# Patient Record
Sex: Male | Born: 1955 | Race: Black or African American | Hispanic: No | Marital: Married | Smoking: Former smoker
Health system: Southern US, Community
[De-identification: ages and names within clinical notes are randomized; demographics above are authoritative.]

## PROBLEM LIST (undated history)

## (undated) ENCOUNTER — Ambulatory Visit (HOSPITAL_COMMUNITY): Admission: EM | Payer: BC Managed Care – PPO

## (undated) DIAGNOSIS — I499 Cardiac arrhythmia, unspecified: Secondary | ICD-10-CM

## (undated) DIAGNOSIS — K219 Gastro-esophageal reflux disease without esophagitis: Secondary | ICD-10-CM

## (undated) DIAGNOSIS — G4730 Sleep apnea, unspecified: Secondary | ICD-10-CM

## (undated) DIAGNOSIS — M1990 Unspecified osteoarthritis, unspecified site: Secondary | ICD-10-CM

## (undated) DIAGNOSIS — I10 Essential (primary) hypertension: Secondary | ICD-10-CM

## (undated) DIAGNOSIS — E785 Hyperlipidemia, unspecified: Secondary | ICD-10-CM

## (undated) HISTORY — PX: NO PAST SURGERIES: SHX2092

---

## 1999-01-30 ENCOUNTER — Emergency Department (HOSPITAL_COMMUNITY): Admission: EM | Admit: 1999-01-30 | Discharge: 1999-01-30 | Payer: Self-pay | Admitting: Emergency Medicine

## 2000-09-05 ENCOUNTER — Emergency Department (HOSPITAL_COMMUNITY): Admission: EM | Admit: 2000-09-05 | Discharge: 2000-09-05 | Payer: Self-pay | Admitting: Emergency Medicine

## 2002-02-26 ENCOUNTER — Emergency Department (HOSPITAL_COMMUNITY): Admission: EM | Admit: 2002-02-26 | Discharge: 2002-02-26 | Payer: Self-pay | Admitting: Emergency Medicine

## 2002-06-28 ENCOUNTER — Emergency Department (HOSPITAL_COMMUNITY): Admission: EM | Admit: 2002-06-28 | Discharge: 2002-06-28 | Payer: Self-pay | Admitting: Emergency Medicine

## 2003-04-19 ENCOUNTER — Emergency Department (HOSPITAL_COMMUNITY): Admission: EM | Admit: 2003-04-19 | Discharge: 2003-04-19 | Payer: Self-pay

## 2003-04-19 ENCOUNTER — Encounter: Payer: Self-pay | Admitting: Emergency Medicine

## 2003-11-14 ENCOUNTER — Emergency Department (HOSPITAL_COMMUNITY): Admission: EM | Admit: 2003-11-14 | Discharge: 2003-11-14 | Payer: Self-pay | Admitting: Emergency Medicine

## 2004-08-17 ENCOUNTER — Emergency Department (HOSPITAL_COMMUNITY): Admission: EM | Admit: 2004-08-17 | Discharge: 2004-08-18 | Payer: Self-pay | Admitting: Emergency Medicine

## 2005-12-03 ENCOUNTER — Emergency Department (HOSPITAL_COMMUNITY): Admission: EM | Admit: 2005-12-03 | Discharge: 2005-12-03 | Payer: Self-pay | Admitting: Emergency Medicine

## 2006-02-24 ENCOUNTER — Emergency Department (HOSPITAL_COMMUNITY): Admission: EM | Admit: 2006-02-24 | Discharge: 2006-02-25 | Payer: Self-pay | Admitting: Emergency Medicine

## 2006-07-10 ENCOUNTER — Emergency Department (HOSPITAL_COMMUNITY): Admission: EM | Admit: 2006-07-10 | Discharge: 2006-07-11 | Payer: Self-pay | Admitting: Emergency Medicine

## 2006-10-31 ENCOUNTER — Emergency Department (HOSPITAL_COMMUNITY): Admission: EM | Admit: 2006-10-31 | Discharge: 2006-10-31 | Payer: Self-pay | Admitting: Emergency Medicine

## 2007-08-08 ENCOUNTER — Emergency Department (HOSPITAL_COMMUNITY): Admission: EM | Admit: 2007-08-08 | Discharge: 2007-08-08 | Payer: Self-pay | Admitting: Emergency Medicine

## 2008-01-10 ENCOUNTER — Emergency Department (HOSPITAL_COMMUNITY): Admission: EM | Admit: 2008-01-10 | Discharge: 2008-01-10 | Payer: Self-pay | Admitting: Emergency Medicine

## 2008-06-12 ENCOUNTER — Emergency Department (HOSPITAL_COMMUNITY): Admission: EM | Admit: 2008-06-12 | Discharge: 2008-06-12 | Payer: Self-pay | Admitting: Emergency Medicine

## 2009-02-05 ENCOUNTER — Emergency Department (HOSPITAL_COMMUNITY): Admission: EM | Admit: 2009-02-05 | Discharge: 2009-02-05 | Payer: Self-pay | Admitting: Emergency Medicine

## 2012-01-20 ENCOUNTER — Emergency Department (HOSPITAL_COMMUNITY): Payer: 59

## 2012-01-20 ENCOUNTER — Emergency Department (HOSPITAL_COMMUNITY)
Admission: EM | Admit: 2012-01-20 | Discharge: 2012-01-20 | Disposition: A | Discharge status: Home / Self Care | Payer: 59 | Attending: Emergency Medicine | Admitting: Emergency Medicine

## 2012-01-20 ENCOUNTER — Encounter (HOSPITAL_COMMUNITY): Payer: Self-pay | Admitting: *Deleted

## 2012-01-20 DIAGNOSIS — IMO0002 Reserved for concepts with insufficient information to code with codable children: Secondary | ICD-10-CM | POA: Insufficient documentation

## 2012-01-20 DIAGNOSIS — M1990 Unspecified osteoarthritis, unspecified site: Secondary | ICD-10-CM

## 2012-01-20 DIAGNOSIS — M25569 Pain in unspecified knee: Secondary | ICD-10-CM

## 2012-01-20 DIAGNOSIS — M1710 Unilateral primary osteoarthritis, unspecified knee: Secondary | ICD-10-CM | POA: Insufficient documentation

## 2012-01-20 MED ORDER — HYDROCODONE-ACETAMINOPHEN 5-500 MG PO TABS: 1.0000 | ORAL_TABLET | Freq: Four times a day (QID) | ORAL | Status: AC | PRN

## 2012-01-20 MED ORDER — IBUPROFEN 200 MG PO TABS
600.0000 mg | ORAL_TABLET | Freq: Once | ORAL | Status: AC
  Administered 2012-01-20: 600 mg via ORAL
  Filled 2012-01-20: qty 3

## 2012-01-20 MED ORDER — IBUPROFEN 600 MG PO TABS: 600.0000 mg | ORAL_TABLET | Freq: Four times a day (QID) | ORAL | Status: AC | PRN

## 2012-01-20 NOTE — ED Provider Notes (Addendum)
History     CSN: [id number]  Arrival date & time 01/20/12  1027   First MD Initiated Contact with Patient 01/20/12 1101      Chief Complaint  Patient presents with  . Knee Pain    (Consider location/radiation/quality/duration/timing/severity/associated sxs/prior treatment) Patient is a 56 y.o. male presenting with knee pain. The history is provided by the patient and a relative.  Knee Pain Pertinent negatives include no chest pain and no shortness of breath.  pt indicates yesterday moved his left knee a certain way and felt a pop. C/o soreness/pain since. Constant, dull, nonradiating. Worse w walking. No hip or ankle pain. No leg swelling. No numbness/weakness. No fall. No skin changes, sts or erythema. States similar problems intermittently in knee for 'long time'.   History reviewed. No pertinent past medical history.  History reviewed. No pertinent past surgical history.  History reviewed. No pertinent family history.  History  Substance Use Topics  . Smoking status: Not on file  . Smokeless tobacco: Not on file  . Alcohol Use: No      Review of Systems  Constitutional: Negative for fever.  Respiratory: Negative for shortness of breath.   Cardiovascular: Negative for chest pain and leg swelling.  Neurological: Negative for weakness and numbness.    Allergies  Review of patient's allergies indicates no known allergies.  Home Medications   Current Outpatient Rx  Name Route Sig Dispense Refill  . BC HEADACHE PO Oral Take 1 packet by mouth every 6 (six) hours as needed. For pain    . ALKA-SELTZER PLUS COLD PO Oral Take 1 capsule by mouth every 4 (four) hours as needed. For  Cold symptoms      BP 130/93  Pulse 82  Temp(Src) 97.8 F (36.6 C) (Oral)  Resp 20  SpO2 94%  Physical Exam  Nursing note and vitals reviewed. Constitutional: He is oriented to person, place, and time. He appears well-developed and well-nourished. No distress.  HENT:  Head:  Atraumatic.  Neck: Neck supple. No tracheal deviation present.  Cardiovascular: Normal rate.   Pulmonary/Chest: Effort normal. No accessory muscle usage. No respiratory distress.  Abdominal: He exhibits no distension.  Musculoskeletal: Normal range of motion. He exhibits no edema.       Good rom at left knee hip and ankle. Distal pulses palp. Tenderness left knee anteriorly. No increased warmth, no skin changes or sts. No leg swelling. No large effusion noted. Knee stable, no gross ligament laxity appreciated.   Neurological: He is alert and oriented to person, place, and time.  Skin: Skin is warm and dry.  Psychiatric: He has a normal mood and affect.    ED Course  Procedures (including critical care time)   No results found for this or any previous visit. Dg Knee Complete 4 Views Left  01/20/2012  *RADIOLOGY REPORT*  Clinical Data: Pain  LEFT KNEE - COMPLETE 4+ VIEW  Comparison: None.  Findings: There is a small knee joint effusion.  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  There is evidence of an old MCL tear with chronic calcification in the ligament.  IMPRESSION: Small amount of joint fluid.  Tricompartmental degenerative disease.  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  Evidence of old MCL injury with subsequent calcification.  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  Original Report Authenticated By: Thomasenia Sales, M.D.     MDM  Motrin po. Xrays.   Discussed xrays w pt.       Suzi Roots, MD 01/20/12 1132  Suzi Roots, MD 01/20/12 1149

## 2012-01-20 NOTE — ED Notes (Signed)
Reports feeling a "pop" feeling to left knee last night and now having pain.

## 2012-01-20 NOTE — Discharge Instructions (Signed)
Take motrin as need for pain. Do not take other non steriodal/antiinflammatory pain medication (or goodys, aleve, etc) when taking motrin.  You may also take vicodin as need for pain. No driving when taking vicodin. Also, do not take tylenol or acetaminophen containing medication when taking vicodin.  Follow up with orthopedist in 1-2 weeks - call office Monday to arrange follow up appointment.  Your knee xrays were read as follows, discuss with orthopedist at follow up.    01/20/2012  *RADIOLOGY REPORT*  Clinical Data: Pain  LEFT KNEE - COMPLETE 4+ VIEW  Comparison: None.  Findings: There is a small knee joint effusion.  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  There is evidence of an old MCL tear with chronic calcification in the ligament.  IMPRESSION: Small amount of joint fluid.  Tricompartmental degenerative disease.  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  Evidence of old MCL injury with subsequent calcification.  Chronic fragmentation of the tibial tubercle related to old Osgood- Lobbyist.   Return to ER if worse, fevers, redness, severe swelling, other concern.      Knee Pain The knee is the complex joint between your thigh and your lower leg. It is made up of bones, tendons, ligaments, and cartilage. The bones that make up the knee are:  The femur in the thigh.   The tibia and fibula in the lower leg.   The patella or kneecap riding in the groove on the lower femur.  CAUSES  Knee pain is a common complaint with many causes. A few of these causes are:  Injury, such as:   A ruptured ligament or tendon injury.   Torn cartilage.   Medical conditions, such as:   Gout   Arthritis   Infections   Overuse,  over training or overdoing a physical activity.  Knee pain can be minor or severe. Knee pain can accompany debilitating injury. Minor knee problems often respond well to self-care measures or get well on their own. More serious injuries may need medical intervention or even surgery. SYMPTOMS The knee is complex. Symptoms of knee problems can vary widely. Some of the problems are:  Pain with movement and weight bearing.   Swelling and tenderness.   Buckling of the knee.   Inability to straighten or extend your knee.   Your knee locks and you cannot straighten it.   Warmth and redness with pain and fever.   Deformity or dislocation of the kneecap.  DIAGNOSIS  Determining what is wrong may be very straight forward such as when there is an injury. It can also be challenging because of the complexity of the knee. Tests to make a diagnosis may include:  Your caregiver taking a history and doing a physical exam.   Routine X-rays can be used to rule out other problems. X-rays will not reveal a cartilage tear. Some injuries of the knee can be diagnosed by:   Arthroscopy a surgical technique by which a small video camera is inserted through tiny incisions on the sides of the knee. This procedure is used to examine and repair internal knee joint problems. Tiny instruments can be used during arthroscopy to repair the torn knee cartilage (meniscus).   Arthrography is a radiology technique. A contrast liquid is directly injected into the knee  joint. Internal structures of the knee joint then become visible on X-ray film.   An MRI scan is a non x-ray radiology procedure in which magnetic fields and a computer produce two- or three-dimensional images of the inside of the knee. Cartilage tears are often visible using an MRI scanner. MRI scans have largely replaced arthrography in diagnosing cartilage tears of the knee.   Blood work.   Examination of the fluid that helps to lubricate the knee joint  (synovial fluid). This is done by taking a sample out using a needle and a syringe.  TREATMENT The treatment of knee problems depends on the cause. Some of these treatments are:  Depending on the injury, proper casting, splinting, surgery or physical therapy care will be needed.   Give yourself adequate recovery time. Do not overuse your joints. If you begin to get sore during workout routines, back off. Slow down or do fewer repetitions.   For repetitive activities such as cycling or running, maintain your strength and nutrition.   Alternate muscle groups. For example if you are a weight lifter, work the upper body on one day and the lower body the next.   Either tight or weak muscles do not give the proper support for your knee. Tight or weak muscles do not absorb the stress placed on the knee joint. Keep the muscles surrounding the knee strong.   Take care of mechanical problems.   If you have flat feet, orthotics or special shoes may help. See your caregiver if you need help.   Arch supports, sometimes with wedges on the inner or outer aspect of the heel, can help. These can shift pressure away from the side of the knee most bothered by osteoarthritis.   A brace called an "unloader" brace also may be used to help ease the pressure on the most arthritic side of the knee.   If your caregiver has prescribed crutches, braces, wraps or ice, use as directed. The acronym for this is PRICE. This means protection, rest, ice, compression and elevation.   Nonsteroidal anti-inflammatory drugs (NSAID's), can help relieve pain. But if taken immediately after an injury, they may actually increase swelling. Take NSAID's with food in your stomach. Stop them if you develop stomach problems. Do not take these if you have a history of ulcers, stomach pain or bleeding from the bowel. Do not take without your caregiver's approval if you have problems with fluid retention, heart failure, or kidney problems.    For ongoing knee problems, physical therapy may be helpful.   Glucosamine and chondroitin are over-the-counter dietary supplements. Both may help relieve the pain of osteoarthritis in the knee. These medicines are different from the usual anti-inflammatory drugs. Glucosamine may decrease the rate of cartilage destruction.   Injections of a corticosteroid drug into your knee joint may help reduce the symptoms of an arthritis flare-up. They may provide pain relief that lasts a few months. You may have to wait a few months between injections. The injections do have a small increased risk of infection, water retention and elevated blood sugar levels.   Hyaluronic acid injected into damaged joints may ease pain and provide lubrication. These injections may work by reducing inflammation. A series of shots may give relief for as long as 6 months.   Topical painkillers. Applying certain ointments to your skin may help relieve the pain and stiffness of osteoarthritis. Ask your pharmacist for suggestions. Many over the-counter products are approved for temporary relief of arthritis pain.  In some countries, doctors often prescribe topical NSAID's for relief of chronic conditions such as arthritis and tendinitis. A review of treatment with NSAID creams found that they worked as well as oral medications but without the serious side effects.  PREVENTION  Maintain a healthy weight. Extra pounds put more strain on your joints.   Get strong, stay limber. Weak muscles are a common cause of knee injuries. Stretching is important. Include flexibility exercises in your workouts.   Be smart about exercise. If you have osteoarthritis, chronic knee pain or recurring injuries, you may need to change the way you exercise. This does not mean you have to stop being active. If your knees ache after jogging or playing basketball, consider switching to swimming, water aerobics or other low-impact activities, at least for a  few days a week. Sometimes limiting high-impact activities will provide relief.   Make sure your shoes fit well. Choose footwear that is right for your sport.   Protect your knees. Use the proper gear for knee-sensitive activities. Use kneepads when playing volleyball or laying carpet. Buckle your seat belt every time you drive. Most shattered kneecaps occur in car accidents.   Rest when you are tired.  SEEK MEDICAL CARE IF:  You have knee pain that is continual and does not seem to be getting better.  SEEK IMMEDIATE MEDICAL CARE IF:  Your knee joint feels hot to the touch and you have a high fever. MAKE SURE YOU:   Understand these instructions.   Will watch your condition.   Will get help right away if you are not doing well or get worse.  Document Released: 07/08/2007 Document Revised: 08/30/2011 Document Reviewed: 07/08/2007 [facility] Patient Information 2012 [place], [place].    Osteoarthritis Osteoarthritis is the most common form of arthritis. It is redness, soreness, and swelling (inflammation) affecting the cartilage. Cartilage acts as a cushion, covering the ends of bones where they meet to form a joint. CAUSES  Over time, the cartilage begins to wear away. This causes bone to rub on bone. This produces pain and stiffness in the affected joints. Factors that contribute to this problem are:  Excessive body weight.   Age.   Overuse of joints.  SYMPTOMS   People with osteoarthritis usually experience joint pain, swelling, or stiffness.   Over time, the joint may lose its normal shape.   Small deposits of bone (osteophytes) may grow on the edges of the joint.   Bits of bone or cartilage can break off and float inside the joint space. This may cause more pain and damage.   Osteoarthritis can lead to depression, anxiety, feelings of helplessness, and limitations on daily activities.  The most commonly affected joints are in the:  Ends of the fingers.   Thumbs.    Neck.   Lower back.   Knees.   Hips.  DIAGNOSIS  Diagnosis is mostly based on your symptoms and exam. Tests may be helpful, including:  X-rays of the affected joint.   A computerized magnetic scan (MRI).   Blood tests to rule out other types of arthritis.   Joint fluid tests. This involves using a needle to draw fluid from the joint and examining the fluid under a microscope.  TREATMENT  Goals of treatment are to control pain, improve joint function, maintain a normal body weight, and maintain a healthy lifestyle. Treatment approaches may include:  A prescribed exercise program with rest and joint relief.   Weight control with nutritional education.   Pain  relief techniques such as:   Properly applied heat and cold.   Electric pulses delivered to nerve endings under the skin (transcutaneous electrical nerve stimulation, TENS).   Massage.   Certain supplements. Ask your caregiver before using any supplements, especially in combination with prescribed drugs.   Medicines to control pain, such as:   Acetaminophen.   Nonsteroidal anti-inflammatory drugs (NSAIDs), such as naproxen.   Narcotic or central-acting agents, such as tramadol. This drug carries a risk of addiction and is generally prescribed for short-term use.   Corticosteroids. These can be given orally or as injection. This is a short-term treatment, not recommended for routine use.   Surgery to reposition the bones and relieve pain (osteotomy) or to remove loose pieces of bone and cartilage. Joint replacement may be needed in advanced states of osteoarthritis.  HOME CARE INSTRUCTIONS  Your caregiver can recommend specific types of exercise. These may include:  Strengthening exercises. These are done to strengthen the muscles that support joints affected by arthritis. They can be performed with weights or with exercise bands to add resistance.   Aerobic activities. These are exercises, such as brisk walking  or low-impact aerobics, that get your heart pumping. They can help keep your lungs and circulatory system in shape.   Range-of-motion activities. These keep your joints limber.   Balance and agility exercises. These help you maintain daily living skills.  Learning about your condition and being actively involved in your care will help improve the course of your osteoarthritis. SEEK MEDICAL CARE IF:   You feel hot or your skin turns red.   You develop a rash in addition to your joint pain.   You have an oral temperature above 102 F (38.9 C).  FOR MORE INFORMATION  National Institute of Arthritis and Musculoskeletal and Skin Diseases: www.niams.http://www.myers.net/ [employer] on Aging: https://walker.com/ American College of Rheumatology: www.rheumatology.org Document Released: 09/10/2005 Document Revised: 08/30/2011 Document Reviewed: 12/22/2009 [facility] Patient Information 2012 [place], [place].

## 2012-01-20 NOTE — ED Notes (Signed)
Patient transported to X-ray 

## 2012-01-20 NOTE — ED Notes (Signed)
Pt c/o (L) knee pain, pt was sitting on the couch and went to straighten out his (L) leg when he felt a "pop" in his (L) knee, pt reports a sudden onset of pain that became progressively worse over night, increase pain this am especially w/standing or when trying to stretch his (L) leg out flat. 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

## 2012-02-28 ENCOUNTER — Encounter (HOSPITAL_COMMUNITY): Payer: Self-pay | Admitting: Pharmacy Technician

## 2012-03-04 ENCOUNTER — Encounter (HOSPITAL_COMMUNITY): Payer: Self-pay

## 2012-03-04 ENCOUNTER — Ambulatory Visit (HOSPITAL_COMMUNITY)
Admission: RE | Admit: 2012-03-04 | Discharge: 2012-03-04 | Disposition: A | Discharge status: Home / Self Care | Payer: 59 | Source: Ambulatory Visit | Attending: Orthopedic Surgery | Admitting: Orthopedic Surgery

## 2012-03-04 ENCOUNTER — Encounter (HOSPITAL_COMMUNITY)
Admission: RE | Admit: 2012-03-04 | Discharge: 2012-03-04 | Disposition: A | Discharge status: Home / Self Care | Payer: 59 | Source: Ambulatory Visit | Attending: Orthopedic Surgery | Admitting: Orthopedic Surgery

## 2012-03-04 DIAGNOSIS — M25569 Pain in unspecified knee: Secondary | ICD-10-CM | POA: Insufficient documentation

## 2012-03-04 DIAGNOSIS — Z01812 Encounter for preprocedural laboratory examination: Secondary | ICD-10-CM | POA: Insufficient documentation

## 2012-03-04 HISTORY — DX: Hyperlipidemia, unspecified: E78.5

## 2012-03-04 HISTORY — DX: Unspecified osteoarthritis, unspecified site: M19.90

## 2012-03-04 HISTORY — DX: Sleep apnea, unspecified: G47.30

## 2012-03-04 HISTORY — DX: Gastro-esophageal reflux disease without esophagitis: K21.9

## 2012-03-04 HISTORY — DX: Essential (primary) hypertension: I10

## 2012-03-04 HISTORY — DX: Cardiac arrhythmia, unspecified: I49.9

## 2012-03-04 LAB — PROTIME-INR: INR: 0.93 (ref 0.00–1.49)

## 2012-03-04 LAB — URINALYSIS, ROUTINE W REFLEX MICROSCOPIC
Ketones, ur: NEGATIVE mg/dL
Leukocytes, UA: NEGATIVE
Nitrite: NEGATIVE
Specific Gravity, Urine: 1.021 (ref 1.005–1.030)
pH: 6 (ref 5.0–8.0)

## 2012-03-04 LAB — SURGICAL PCR SCREEN: Staphylococcus aureus: NEGATIVE

## 2012-03-04 NOTE — Patient Instructions (Addendum)
20 Randall Cook  03/04/2012   Your procedure is scheduled on:  03/10/12  Monday  Surgery 3:35pm-5:35pm  Report to [facility] at     1:05 PM [facility]  Call this number if you have problems the morning of surgery: [phone]     Or PST   [id number]  [facility]   Remember:   Do not eat food: After Midnight.  Sunday NIGHT   May have clear liquids: until 0700 AM Monday  THEN NONE  Clear liquids include soda, tea, black coffee, apple or grape juice, broth.  Take these medicines the morning of surgery with A SIP OF WATER:NORCO IF NEEDED DO NOT FORGET TO TAKE  BYSTOLIC AT BEDTIME Sunday NIGHT  Do not wear jewelry, make-up or nail polish.  Do not wear lotions, powders, or perfumes. You may wear deodorant.  Do not shave 48 hours prior to surgery.  Do not bring valuables to the hospital.  Contacts, dentures or bridgework may not be worn into surgery.  Leave suitcase in the car. After surgery it may be brought to your room.  For patients admitted to the hospital, checkout time is 11:00 AM the day of discharge.   Patients discharged the day of surgery will not be allowed to drive home.  Name and phone number of your driver:    wife                                                                 Special Instructions: CHG Shower Use Special Wash: 1/2 bottle night before surgery and 1/2 bottle morning of surgery. REGULAR SOAP FACE AND PRIVATES                            MEN-MAY SHAVE FACE MORNING OF SURGERY  Please read over the following fact sheets that you were given: MRSA Information

## 2012-03-04 NOTE — Progress Notes (Signed)
03/04/12 1102  OBSTRUCTIVE SLEEP APNEA  Have you ever been diagnosed with sleep apnea through a sleep study? No  Do you snore loudly (loud enough to be heard through closed doors)?  1  Do you often feel tired, fatigued, or sleepy during the daytime? 0  Has anyone observed you stop breathing during your sleep? 0  Do you have, or are you being treated for high blood pressure? 1  BMI more than 35 kg/m2? 0  Age over 55 years old? 1  Neck circumference greater than 40 cm/18 inches? 0  Gender: 1  Obstructive Sleep Apnea Score 4   Score 4 or greater  Updated health history;Results sent to PCP

## 2012-03-09 NOTE — H&P (Signed)
Randall Cook is an 56 y.o. male.    Chief Complaint: left knee OA and pain   HPI: Pt is a 56 y.o. male complaining of left knee pain for 3+ years, over the last year the pain has increase significantly . Pain had continually increased since the beginning. X-rays in the clinic show end-stage arthritic changes of the letf knee. Pt has tried various conservative treatments which have failed to alleviate their symptoms, including NSAIDs and steroid injections. Various options are discussed with the patient. Risks, benefits and expectations were discussed with the patient. Patient understand the risks, benefits and expectations and wishes to proceed with surgery.   PCP:  Sissy Hoff, MD  D/C Plans:  Home with HHPT  Post-op Meds:  Rx given for ASA, Robaxin, Celebrex, Iron, Colace and MiraLax  Tranexamic Acid:   To be given  Decadron:   To be given  PMH: Past Medical History  Diagnosis Date  . GERD (gastroesophageal reflux disease)   . Arthritis   . Hyperlipidemia   . Hypertension     LOV,   Dr Azucena Cecil 02/29/12 on chart-   CBC with diff, [facility] 02/29/12 on chart  . Dysrhythmia     tachycardia- EKG,clearance 02/29/12 Dr Azucena Cecil  on chart  . Sleep apnea     STOP BANG SCORE 4    PSH: Past Surgical History  Procedure Date  . No past surgeries     Social History:  reports that he quit smoking about 13 years ago. His smoking use included Cigarettes. He has a 10 pack-year smoking history. He has never used smokeless tobacco. He reports that he drinks alcohol. He reports that he does not use illicit drugs.  Allergies:  No Known Allergies  Medications: No current facility-administered medications for this encounter.   Current Outpatient Prescriptions  Medication Sig Dispense Refill  . Aspirin-Salicylamide-Caffeine (BC HEADACHE PO) Take 1 packet by mouth every 6 (six) hours as needed. For pain      . HYDROcodone-acetaminophen (NORCO) 5-325 MG per tablet Take 1 tablet by mouth every 6 (six)  hours as needed. For pain      . ibuprofen (ADVIL,MOTRIN) 600 MG tablet Take 600 mg by mouth every 6 (six) hours as needed. For pain      . lisinopril-hydrochlorothiazide (PRINZIDE,ZESTORETIC) 20-12.5 MG per tablet Take 1 tablet by mouth daily. New rx- hasnt started yet 03/04/12      . meloxicam (MOBIC) 15 MG tablet Take 15 mg by mouth daily with breakfast.      . nebivolol (BYSTOLIC) 5 MG tablet Take 5 mg by mouth at bedtime.        ROS: Review of Systems  Constitutional: Negative.   HENT: Negative.   Eyes: Negative.   Respiratory: Negative.   Cardiovascular: Negative.   Gastrointestinal: Negative.   Genitourinary: Negative.   Musculoskeletal: Positive for joint pain.  Skin: Negative.   Neurological: Negative.   Endo/Heme/Allergies: Negative.   Psychiatric/Behavioral: Negative.      Physical Exam: BP: 138/85 ; HR: 88 ; Resp: 18 Physical Exam  Constitutional: He is oriented to person, place, and time and well-developed, well-nourished, and in no distress.  HENT:  Head: Normocephalic and atraumatic.  Nose: Nose normal.  Mouth/Throat: Oropharynx is clear and moist.  Eyes: Pupils are equal, round, and reactive to light.  Neck: Neck supple. No JVD present. No tracheal deviation present. No thyromegaly present.  Cardiovascular: Normal rate, regular rhythm, normal heart sounds and intact distal pulses.   Pulmonary/Chest: Effort  normal and breath sounds normal. No respiratory distress. He has no wheezes. He exhibits no tenderness.  Abdominal: Soft. There is no tenderness. There is no guarding.  Musculoskeletal:       Left knee: He exhibits decreased range of motion, swelling and bony tenderness. He exhibits no effusion, no ecchymosis and no deformity. tenderness found.  Lymphadenopathy:    He has no cervical adenopathy.  Neurological: He is alert and oriented to person, place, and time.  Skin: Skin is warm and dry.  Psychiatric: Affect normal.     Assessment/Plan Assessment:  left knee OA and pain   Plan: Patient will undergo a left total knee arthroplasty on 03/10/2012 per Dr. Charlann Boxer at [facility]. Risks benefits and expectation were discussed with the patient. Patient understand risks, benefits and expectation and wishes to proceed.   Randall Cook   PAC  03/09/2012, 3:22 PM

## 2012-03-10 ENCOUNTER — Inpatient Hospital Stay (HOSPITAL_COMMUNITY)
Admission: RE | Admit: 2012-03-10 | Discharge: 2012-03-11 | DRG: 470 | Disposition: A | Discharge status: Home Health | Payer: 59 | Source: Ambulatory Visit | Attending: Orthopedic Surgery | Admitting: Orthopedic Surgery

## 2012-03-10 ENCOUNTER — Encounter (HOSPITAL_COMMUNITY): Payer: Self-pay | Admitting: *Deleted

## 2012-03-10 ENCOUNTER — Encounter (HOSPITAL_COMMUNITY)
Admission: RE | Disposition: A | Discharge status: Home Health | Payer: Self-pay | Source: Ambulatory Visit | Attending: Orthopedic Surgery

## 2012-03-10 ENCOUNTER — Ambulatory Visit (HOSPITAL_COMMUNITY): Payer: 59 | Admitting: Anesthesiology

## 2012-03-10 ENCOUNTER — Encounter (HOSPITAL_COMMUNITY): Payer: Self-pay | Admitting: Anesthesiology

## 2012-03-10 DIAGNOSIS — K219 Gastro-esophageal reflux disease without esophagitis: Secondary | ICD-10-CM | POA: Diagnosis present

## 2012-03-10 DIAGNOSIS — I499 Cardiac arrhythmia, unspecified: Secondary | ICD-10-CM | POA: Diagnosis present

## 2012-03-10 DIAGNOSIS — Z7982 Long term (current) use of aspirin: Secondary | ICD-10-CM

## 2012-03-10 DIAGNOSIS — I10 Essential (primary) hypertension: Secondary | ICD-10-CM | POA: Diagnosis present

## 2012-03-10 DIAGNOSIS — Z96652 Presence of left artificial knee joint: Secondary | ICD-10-CM

## 2012-03-10 DIAGNOSIS — M87059 Idiopathic aseptic necrosis of unspecified femur: Secondary | ICD-10-CM | POA: Diagnosis present

## 2012-03-10 DIAGNOSIS — Z87891 Personal history of nicotine dependence: Secondary | ICD-10-CM

## 2012-03-10 DIAGNOSIS — M1710 Unilateral primary osteoarthritis, unspecified knee: Principal | ICD-10-CM | POA: Diagnosis present

## 2012-03-10 DIAGNOSIS — E785 Hyperlipidemia, unspecified: Secondary | ICD-10-CM | POA: Diagnosis present

## 2012-03-10 DIAGNOSIS — Z79899 Other long term (current) drug therapy: Secondary | ICD-10-CM

## 2012-03-10 HISTORY — PX: PARTIAL KNEE ARTHROPLASTY: SHX2174

## 2012-03-10 LAB — TYPE AND SCREEN
ABO/RH(D): O POS
Antibody Screen: NEGATIVE

## 2012-03-10 LAB — ABO/RH: ABO/RH(D): O POS

## 2012-03-10 SURGERY — ARTHROPLASTY, KNEE, UNICOMPARTMENTAL
Anesthesia: Spinal | Site: Knee | Laterality: Left | Wound class: Clean

## 2012-03-10 MED ORDER — KETAMINE HCL 10 MG/ML IJ SOLN
INTRAMUSCULAR | Status: DC | PRN
  Administered 2012-03-10: 40 mg via INTRAVENOUS

## 2012-03-10 MED ORDER — POLYETHYLENE GLYCOL 3350 17 G PO PACK
17.0000 g | PACK | Freq: Two times a day (BID) | ORAL | Status: DC
  Administered 2012-03-10 – 2012-03-11 (×2): 17 g via ORAL

## 2012-03-10 MED ORDER — DIPHENHYDRAMINE HCL 25 MG PO CAPS: 25.0000 mg | ORAL_CAPSULE | Freq: Four times a day (QID) | ORAL | Status: DC | PRN

## 2012-03-10 MED ORDER — MIDAZOLAM HCL 5 MG/5ML IJ SOLN
INTRAMUSCULAR | Status: DC | PRN
  Administered 2012-03-10: 2 mg via INTRAVENOUS

## 2012-03-10 MED ORDER — HYDROCODONE-ACETAMINOPHEN 7.5-325 MG PO TABS
1.0000 | ORAL_TABLET | ORAL | Status: DC
  Administered 2012-03-10: 1 via ORAL
  Administered 2012-03-11 (×4): 2 via ORAL
  Filled 2012-03-10 (×4): qty 2
  Filled 2012-03-10: qty 1
  Filled 2012-03-10: qty 2

## 2012-03-10 MED ORDER — BUPIVACAINE IN DEXTROSE 0.75-8.25 % IT SOLN
INTRATHECAL | Status: DC | PRN
  Administered 2012-03-10: 1.8 mL via INTRATHECAL

## 2012-03-10 MED ORDER — ZOLPIDEM TARTRATE 5 MG PO TABS: 5.0000 mg | ORAL_TABLET | Freq: Every evening | ORAL | Status: DC | PRN

## 2012-03-10 MED ORDER — DOCUSATE SODIUM 100 MG PO CAPS
100.0000 mg | ORAL_CAPSULE | Freq: Two times a day (BID) | ORAL | Status: DC
  Administered 2012-03-11: 100 mg via ORAL

## 2012-03-10 MED ORDER — BUPIVACAINE-EPINEPHRINE PF 0.25-1:200000 % IJ SOLN
INTRAMUSCULAR | Status: AC
Start: 2012-03-10 — End: 2012-03-10
  Filled 2012-03-10: qty 30

## 2012-03-10 MED ORDER — FENTANYL CITRATE 0.05 MG/ML IJ SOLN
INTRAMUSCULAR | Status: DC | PRN
  Administered 2012-03-10 (×4): 25 ug via INTRAVENOUS
  Administered 2012-03-10 (×2): 50 ug via INTRAVENOUS

## 2012-03-10 MED ORDER — FLEET ENEMA 7-19 GM/118ML RE ENEM: 1.0000 | ENEMA | Freq: Once | RECTAL | Status: AC | PRN

## 2012-03-10 MED ORDER — METOCLOPRAMIDE HCL 5 MG/ML IJ SOLN: 5.0000 mg | Freq: Three times a day (TID) | INTRAMUSCULAR | Status: DC | PRN

## 2012-03-10 MED ORDER — MENTHOL 3 MG MT LOZG
1.0000 | LOZENGE | OROMUCOSAL | Status: DC | PRN
  Filled 2012-03-10: qty 9

## 2012-03-10 MED ORDER — KETOROLAC TROMETHAMINE 30 MG/ML IJ SOLN
INTRAMUSCULAR | Status: DC | PRN
  Administered 2012-03-10: 30 mg

## 2012-03-10 MED ORDER — BUPIVACAINE-EPINEPHRINE PF 0.25-1:200000 % IJ SOLN
INTRAMUSCULAR | Status: AC
  Filled 2012-03-10: qty 30

## 2012-03-10 MED ORDER — HYDROMORPHONE HCL PF 1 MG/ML IJ SOLN
INTRAMUSCULAR | Status: AC
  Filled 2012-03-10: qty 1

## 2012-03-10 MED ORDER — PHENOL 1.4 % MT LIQD
1.0000 | OROMUCOSAL | Status: DC | PRN
  Filled 2012-03-10: qty 177

## 2012-03-10 MED ORDER — NEBIVOLOL HCL 5 MG PO TABS
5.0000 mg | ORAL_TABLET | Freq: Every day | ORAL | Status: DC
  Administered 2012-03-10: 5 mg via ORAL
  Filled 2012-03-10 (×2): qty 1

## 2012-03-10 MED ORDER — TRANEXAMIC ACID 100 MG/ML IV SOLN
1650.0000 mg | Freq: Once | INTRAVENOUS | Status: AC
  Administered 2012-03-10: 1650 mg via INTRAVENOUS
  Filled 2012-03-10: qty 16.5

## 2012-03-10 MED ORDER — ONDANSETRON HCL 4 MG PO TABS: 4.0000 mg | ORAL_TABLET | Freq: Four times a day (QID) | ORAL | Status: DC | PRN

## 2012-03-10 MED ORDER — CEFAZOLIN SODIUM-DEXTROSE 2-3 GM-% IV SOLR
INTRAVENOUS | Status: AC
Start: 2012-03-10 — End: 2012-03-10
  Filled 2012-03-10: qty 50

## 2012-03-10 MED ORDER — CEFAZOLIN SODIUM-DEXTROSE 2-3 GM-% IV SOLR
2.0000 g | Freq: Once | INTRAVENOUS | Status: AC
  Administered 2012-03-10: 2 g via INTRAVENOUS

## 2012-03-10 MED ORDER — METHOCARBAMOL 100 MG/ML IJ SOLN
500.0000 mg | Freq: Four times a day (QID) | INTRAVENOUS | Status: DC | PRN
  Filled 2012-03-10: qty 5

## 2012-03-10 MED ORDER — CEFAZOLIN SODIUM-DEXTROSE 2-3 GM-% IV SOLR
2.0000 g | Freq: Four times a day (QID) | INTRAVENOUS | Status: AC
  Administered 2012-03-11 (×2): 2 g via INTRAVENOUS
  Filled 2012-03-10 (×2): qty 50

## 2012-03-10 MED ORDER — ACETAMINOPHEN 10 MG/ML IV SOLN
INTRAVENOUS | Status: DC | PRN
  Administered 2012-03-10: 1000 mg via INTRAVENOUS

## 2012-03-10 MED ORDER — HYDROMORPHONE HCL PF 1 MG/ML IJ SOLN
0.2500 mg | INTRAMUSCULAR | Status: DC | PRN
  Administered 2012-03-10: 0.5 mg via INTRAVENOUS
  Administered 2012-03-10: 0.25 mg via INTRAVENOUS
  Administered 2012-03-10: 0.5 mg via INTRAVENOUS
  Administered 2012-03-10: 0.25 mg via INTRAVENOUS

## 2012-03-10 MED ORDER — HYDROMORPHONE HCL PF 1 MG/ML IJ SOLN: 0.5000 mg | INTRAMUSCULAR | Status: DC | PRN

## 2012-03-10 MED ORDER — LACTATED RINGERS IV SOLN
INTRAVENOUS | Status: DC
  Administered 2012-03-10: 20:00:00 via INTRAVENOUS
  Administered 2012-03-10: 1000 mL via INTRAVENOUS
  Administered 2012-03-10: 18:00:00 via INTRAVENOUS

## 2012-03-10 MED ORDER — KETOROLAC TROMETHAMINE 30 MG/ML IJ SOLN
INTRAMUSCULAR | Status: AC
  Filled 2012-03-10: qty 1

## 2012-03-10 MED ORDER — SODIUM CHLORIDE 0.9 % IV SOLN
INTRAVENOUS | Status: DC
  Administered 2012-03-10: 23:00:00 via INTRAVENOUS
  Filled 2012-03-10 (×3): qty 1000

## 2012-03-10 MED ORDER — METOCLOPRAMIDE HCL 10 MG PO TABS: 5.0000 mg | ORAL_TABLET | Freq: Three times a day (TID) | ORAL | Status: DC | PRN

## 2012-03-10 MED ORDER — KETOROLAC TROMETHAMINE 30 MG/ML IJ SOLN
INTRAMUSCULAR | Status: AC
Start: 2012-03-10 — End: 2012-03-10
  Filled 2012-03-10: qty 1

## 2012-03-10 MED ORDER — ONDANSETRON HCL 4 MG/2ML IJ SOLN: 4.0000 mg | Freq: Four times a day (QID) | INTRAMUSCULAR | Status: DC | PRN

## 2012-03-10 MED ORDER — CELECOXIB 200 MG PO CAPS
200.0000 mg | ORAL_CAPSULE | Freq: Two times a day (BID) | ORAL | Status: DC
  Administered 2012-03-10 – 2012-03-11 (×2): 200 mg via ORAL
  Filled 2012-03-10 (×3): qty 1

## 2012-03-10 MED ORDER — DEXAMETHASONE SODIUM PHOSPHATE 10 MG/ML IJ SOLN
10.0000 mg | Freq: Once | INTRAMUSCULAR | Status: AC
  Administered 2012-03-10: 10 mg via INTRAVENOUS
  Filled 2012-03-10: qty 1

## 2012-03-10 MED ORDER — PROMETHAZINE HCL 25 MG/ML IJ SOLN: 6.2500 mg | INTRAMUSCULAR | Status: DC | PRN

## 2012-03-10 MED ORDER — BUPIVACAINE-EPINEPHRINE 0.25% -1:200000 IJ SOLN
INTRAMUSCULAR | Status: DC | PRN
  Administered 2012-03-10: 30 mL

## 2012-03-10 MED ORDER — ONDANSETRON HCL 4 MG/2ML IJ SOLN
INTRAMUSCULAR | Status: DC | PRN
  Administered 2012-03-10: 4 mg via INTRAVENOUS

## 2012-03-10 MED ORDER — ALUM & MAG HYDROXIDE-SIMETH 200-200-20 MG/5ML PO SUSP: 30.0000 mL | ORAL | Status: DC | PRN

## 2012-03-10 MED ORDER — DEXAMETHASONE SODIUM PHOSPHATE 10 MG/ML IJ SOLN
10.0000 mg | Freq: Once | INTRAMUSCULAR | Status: DC
  Filled 2012-03-10: qty 1

## 2012-03-10 MED ORDER — METHOCARBAMOL 500 MG PO TABS
500.0000 mg | ORAL_TABLET | Freq: Four times a day (QID) | ORAL | Status: DC | PRN
  Administered 2012-03-11 (×3): 500 mg via ORAL
  Filled 2012-03-10 (×3): qty 1

## 2012-03-10 MED ORDER — FERROUS SULFATE 325 (65 FE) MG PO TABS
325.0000 mg | ORAL_TABLET | Freq: Three times a day (TID) | ORAL | Status: DC
  Administered 2012-03-11 (×2): 325 mg via ORAL
  Filled 2012-03-10 (×4): qty 1

## 2012-03-10 MED ORDER — RIVAROXABAN 10 MG PO TABS
10.0000 mg | ORAL_TABLET | ORAL | Status: DC
  Administered 2012-03-11: 10 mg via ORAL
  Filled 2012-03-10: qty 1

## 2012-03-10 MED ORDER — PROPOFOL 10 MG/ML IV EMUL
INTRAVENOUS | Status: DC | PRN
  Administered 2012-03-10 (×2): 75 ug/kg/min via INTRAVENOUS

## 2012-03-10 MED ORDER — ACETAMINOPHEN 10 MG/ML IV SOLN
INTRAVENOUS | Status: AC
  Filled 2012-03-10: qty 100

## 2012-03-10 MED ORDER — BISACODYL 5 MG PO TBEC
5.0000 mg | DELAYED_RELEASE_TABLET | Freq: Every day | ORAL | Status: DC | PRN
Start: 2012-03-10 — End: 2012-03-11

## 2012-03-10 SURGICAL SUPPLY — 52 items
ADH SKN CLS APL DERMABOND .7 (GAUZE/BANDAGES/DRESSINGS) ×1
BAG SPEC THK2 15X12 ZIP CLS (MISCELLANEOUS) ×1
BAG ZIPLOCK 12X15 (MISCELLANEOUS) ×2 IMPLANT
BANDAGE ELASTIC 6 VELCRO ST LF (GAUZE/BANDAGES/DRESSINGS) ×2 IMPLANT
BANDAGE ESMARK 6X9 LF (GAUZE/BANDAGES/DRESSINGS) ×1 IMPLANT
BLADE SAW RECIPROCATING 77.5 (BLADE) ×2 IMPLANT
BLADE SAW SGTL 13.0X1.19X90.0M (BLADE) ×2 IMPLANT
BNDG CMPR 9X6 STRL LF SNTH (GAUZE/BANDAGES/DRESSINGS) ×1
BNDG ESMARK 6X9 LF (GAUZE/BANDAGES/DRESSINGS) ×2
BOWL SMART MIX CTS (DISPOSABLE) ×2 IMPLANT
CEMENT HV SMART SET (Cement) ×2 IMPLANT
CLOTH BEACON ORANGE TIMEOUT ST (SAFETY) ×2 IMPLANT
COVER SURGICAL LIGHT HANDLE (MISCELLANEOUS) ×2 IMPLANT
CUFF TOURN SGL QUICK 34 (TOURNIQUET CUFF) ×2
CUFF TRNQT CYL 34X4X40X1 (TOURNIQUET CUFF) ×1 IMPLANT
DERMABOND ADVANCED (GAUZE/BANDAGES/DRESSINGS) ×1
DERMABOND ADVANCED .7 DNX12 (GAUZE/BANDAGES/DRESSINGS) ×1 IMPLANT
DRAPE EXTREMITY T 121X128X90 (DRAPE) ×2 IMPLANT
DRAPE POUCH INSTRU U-SHP 10X18 (DRAPES) ×2 IMPLANT
DRSG AQUACEL AG ADV 3.5X 6 (GAUZE/BANDAGES/DRESSINGS) ×2 IMPLANT
DRSG AQUACEL AG ADV 3.5X10 (GAUZE/BANDAGES/DRESSINGS) ×2 IMPLANT
DRSG TEGADERM 4X4.75 (GAUZE/BANDAGES/DRESSINGS) ×2 IMPLANT
DURAPREP 26ML APPLICATOR (WOUND CARE) ×2 IMPLANT
ELECT REM PT RETURN 9FT ADLT (ELECTROSURGICAL) ×2
ELECTRODE REM PT RTRN 9FT ADLT (ELECTROSURGICAL) ×1 IMPLANT
EVACUATOR 1/8 PVC DRAIN (DRAIN) ×2 IMPLANT
FACESHIELD LNG OPTICON STERILE (SAFETY) ×10 IMPLANT
GAUZE SPONGE 2X2 8PLY STRL LF (GAUZE/BANDAGES/DRESSINGS) ×1 IMPLANT
GAUZE XEROFORM 2X2 STRL (GAUZE/BANDAGES/DRESSINGS) ×2 IMPLANT
GLOVE BIOGEL PI IND STRL 7.5 (GLOVE) ×2 IMPLANT
GLOVE BIOGEL PI IND STRL 8 (GLOVE) IMPLANT
GLOVE BIOGEL PI INDICATOR 7.5 (GLOVE) ×2
GLOVE BIOGEL PI INDICATOR 8 (GLOVE)
GLOVE ORTHO TXT STRL SZ7.5 (GLOVE) ×4 IMPLANT
GOWN BRE IMP PREV XXLGXLNG (GOWN DISPOSABLE) ×4 IMPLANT
GOWN STRL NON-REIN LRG LVL3 (GOWN DISPOSABLE) ×2 IMPLANT
KIT BASIN OR (CUSTOM PROCEDURE TRAY) ×2 IMPLANT
LEGGING LITHOTOMY PAIR STRL (DRAPES) ×2 IMPLANT
MANIFOLD NEPTUNE II (INSTRUMENTS) ×2 IMPLANT
NDL SAFETY ECLIPSE 18X1.5 (NEEDLE) ×1 IMPLANT
NEEDLE HYPO 18GX1.5 SHARP (NEEDLE) ×2
PACK TOTAL JOINT (CUSTOM PROCEDURE TRAY) ×2 IMPLANT
POSITIONER SURGICAL ARM (MISCELLANEOUS) ×2 IMPLANT
SPONGE GAUZE 2X2 STER 10/PKG (GAUZE/BANDAGES/DRESSINGS) ×1
SUCTION FRAZIER TIP 10 FR DISP (SUCTIONS) ×2 IMPLANT
SUT MNCRL AB 4-0 PS2 18 (SUTURE) ×2 IMPLANT
SUT VIC AB 1 CT1 36 (SUTURE) ×4 IMPLANT
SUT VIC AB 2-0 CT1 27 (SUTURE) ×4
SUT VIC AB 2-0 CT1 TAPERPNT 27 (SUTURE) ×2 IMPLANT
SYR 50ML LL SCALE MARK (SYRINGE) ×2 IMPLANT
TOWEL OR 17X26 10 PK STRL BLUE (TOWEL DISPOSABLE) ×4 IMPLANT
TRAY FOLEY CATH 14FRSI W/METER (CATHETERS) ×2 IMPLANT

## 2012-03-10 NOTE — Anesthesia Preprocedure Evaluation (Addendum)
Anesthesia Evaluation  Patient identified by MRN, date of birth, ID band Patient awake    Reviewed: Allergy & Precautions, H&P , NPO status , Patient's Chart, lab work & pertinent test results  Airway Mallampati: II TM Distance: >3 FB Neck ROM: Full    Dental No notable dental hx.    Pulmonary neg pulmonary ROS,  breath sounds clear to auscultation  Pulmonary exam normal       Cardiovascular hypertension, Pt. on medications negative cardio ROS  Rhythm:Regular Rate:Normal     Neuro/Psych negative neurological ROS  negative psych ROS   GI/Hepatic negative GI ROS, Neg liver ROS,   Endo/Other  negative endocrine ROS  Renal/GU negative Renal ROS  negative genitourinary   Musculoskeletal negative musculoskeletal ROS (+)   Abdominal   Peds negative pediatric ROS (+)  Hematology negative hematology ROS (+)   Anesthesia Other Findings   Reproductive/Obstetrics negative OB ROS                          Anesthesia Physical Anesthesia Plan  ASA: II  Anesthesia Plan: Spinal   Post-op Pain Management:    Induction:   Airway Management Planned: Simple Face Mask  Additional Equipment:   Intra-op Plan:   Post-operative Plan:   Informed Consent:   Plan Discussed with: CRNA  Anesthesia Plan Comments:         Anesthesia Quick Evaluation

## 2012-03-10 NOTE — Anesthesia Procedure Notes (Signed)
Spinal  Patient location during procedure: OR Staffing Performed by: anesthesiologist  Preanesthetic Checklist Completed: patient identified, site marked, surgical consent, pre-op evaluation, timeout performed, IV checked, risks and benefits discussed and monitors and equipment checked Spinal Block Patient position: sitting Prep: Betadine Patient monitoring: heart rate, continuous pulse ox and blood pressure Injection technique: single-shot Needle Needle type: Spinocan  Needle gauge: 24 G Needle length: 9 cm Assessment Sensory level: T8 Additional Notes Expiration date of kit checked and confirmed. Patient tolerated procedure well, without complications.

## 2012-03-10 NOTE — Preoperative (Signed)
Beta Blockers   Reason not to administer Beta Blockers:Not Applicable 

## 2012-03-10 NOTE — Anesthesia Postprocedure Evaluation (Signed)
  Anesthesia Post-op Note  Patient: Randall Cook  Procedure(s) Performed: Procedure(s) (LRB): UNICOMPARTMENTAL KNEE (Left)  Patient Location: PACU  Anesthesia Type: General  Level of Consciousness: awake and alert   Airway and Oxygen Therapy: Patient Spontanous Breathing  Post-op Pain: mild  Post-op Assessment: Post-op Vital signs reviewed, Patient's Cardiovascular Status Stable, Respiratory Function Stable, Patent Airway and No signs of Nausea or vomiting  Post-op Vital Signs: stable  Complications: No apparent anesthesia complications

## 2012-03-10 NOTE — Op Note (Signed)
NAME: Randall Cook    MEDICAL RECORD NO.: [id number]   FACILITY: [facility]   DATE OF BIRTH: 07/11/1956  PHYSICIAN: Madlyn Frankel. Charlann Boxer, M.D.    DATE OF PROCEDURE: 03/10/2012    OPERATIVE REPORT   PREOPERATIVE DIAGNOSIS: Left knee medial compartment osteoarthritis associated with small central osteonecrotic region medial femoral condyle.   POSTOPERATIVE DIAGNOSIS:  Left knee medial compartment osteoarthritis associated with small central osteonecrotic region medial femoral condyle.  PROCEDURE: Left partial knee replacement utilizing Biomet Oxford knee  component, size large femur, a left medial size B tibial tray with a size 4 insert.   SURGEON: Madlyn Frankel. Charlann Boxer, M.D.   ASSISTANT: Lanney Gins, PAC.  Please note that [name] was present for the entirety of the case,  utilized for preoperative positioning, perioperative retractor  management, general facilitation of the case and primary wound closure.   ANESTHESIA: Spinal and general LMA.   SPECIMENS: None.   COMPLICATIONS: None.  DRAINS: 1 medium HV   TOURNIQUET TIME: 57 minutes at 250 mmHg.   INDICATIONS FOR PROCEDURE: The patient is a 56 yo male patient referred for evaluation of left knee pain.  They presented with primary complaints of pain on the medial side of their knee. Radiographs revealed advanced medial compartment arthritis with specifically an antero-medial wear pattern associated with the central osteonecrotic region.  There was bone on bone changes noted with subchondral sclerosis and osteophytes present. The patient has had progressive problems failing to respond to conservative measures of medications, injections and activity modification. Risks of infection, DVT, component failure, need for future revision surgery were all discussed and reviewed.  Consent was obtained for benefit of pain relief.   PROCEDURE IN DETAIL: The patient was brought to the operative theater.  Once adequate anesthesia, preoperative  antibiotics, 2gm Ancef administered, the patient was positioned in supine position with a left thigh tourniquet  placed. The left lower extremity was prepped and draped in sterile  fashion with the leg on the Oxford leg holder.  The leg was allowed to flex to 120 degrees. A time-out  was performed identifying the patient, planned procedure, and extremity.  The leg was exsanguinated, tourniquet elevated to 250 mmHg. A midline  incision was made from the proximal pole of the patella to the tibial tubercle. A  soft tissue plane was created and partial median arthrotomy was then  made to allow for subluxation of the patella. Following initial synovectomy and  debridement, the osteophytes were removed off the medial aspect of the  knee.   Attention was first directed to the tibia. The tibial  extramedullary guide was positioned over the anterior crest of the tibia  and pinned into position, and using a measured resection guide from the  Oxford system, a 4 mm resection was made off the proximal tibia. First  the reciprocating saw along the medial aspect of the tibial spines, then the oscillating saw.    At this point, I sized this cut surface seem to be best fit for a size B tibial tray.  With the retractors out of the wound and the knee held at 90 degrees the 4 feeler gauge had appropriate tension on the medial ligament.   At this point, the femoral canal was opened with a drill and the  intramedullary rod passed. Then using the guide for a large femoral resection off  the posterior aspect of the femur was positioned over the mid portion of the medial femoral condyle.  The orientation was set using  the guide that mates the femoral guide to the intramedullary rod.  The 2 drill holes were made into the distal femur.  The posterior guide was then impacted into place and the posterior  femoral cut made.  At this point, I milled the distal femur with a size 4 spigot in place. At this point, we did a  trial reduction of the large femur, size B tibial tray and a 4 feeler gauget. At 90 degrees of  flexion and at 20 degrees of flexion the knee had a little bit more tension in extension compared to flexion.  Given the difference in the tension between the knee in 90 degrees versus that in 20 degrees I had to place the 5 spigot into the femur and re-mill the distal femur.  Remaining bone was removed and debrided.  I repeated the trial reduction and found that now at both 90 degrees and 20 degrees the knee ligament were tension symmetrically.  Given these findings, the trial femoral component was removed. Final preparation of tibia was carried out by pinning it in position. Then  using a reciprocating saw I removed bone for the keel. Further bone was  removed with an osteotome.  Trial reduction was now carried out with the large femur, the B tibia, and a size 4 lollipop insert. The balance of the  ligaments appeared to be symmetric at 20 degrees and 90 degrees. Given  all these findings, the trial components were removed.   Cement was mixed. The final components were opened. The knee was irrigated with  normal saline solution. Then final debridements of the  soft tissue was carried out, I also drilled the sclerotic bone with a drill.  The final components were cemented with a single batch of cement in a  two-stage technique with the tibial component cemented first. The knee  was then brought  to 45 degrees of flexion with a 4 feeler gauge, held with pressure for a minute and half.  After this the femoral component was cemented in place.  The knee was again held at 45 degrees of flexion while the cement fully cured.  Excess cement was removed throughout the knee. Tourniquet was let down  after 57 minutes. After the cement had fully cured and excessive cement  was removed throughout the knee there was no visualized cement present.   The final size 4 insert was chosen and snapped into position. We  re-irrigated  the knee. I placed a medium Hemovac drain deep. The extensor mechanism  was then reapproximated using a #1 Vicryl with the knee in flexion. The  remaining wound was closed with 2-0 Vicryl and a running 4-0 Monocryl.  The knee was cleaned, dried, and dressed sterilely using Dermabond and  Aquacel dressing. The drain site was dressed separately. The patient  was brought to the recovery room, Ace wrap in place, tolerating the  procedure well. He will be in the hospital for overnight observation.  We will initiate physical therapy and progress to ambulate.     Madlyn Frankel Charlann Boxer, M.D.

## 2012-03-10 NOTE — Transfer of Care (Signed)
Immediate Anesthesia Transfer of Care Note  Patient: Randall Cook  Procedure(s) Performed: Procedure(s) (LRB): UNICOMPARTMENTAL KNEE (Left)  Patient Location: PACU  Anesthesia Type: General and Spinal  Level of Consciousness: awake, alert  and patient cooperative  Airway & Oxygen Therapy: Patient Spontanous Breathing and Patient connected to face mask oxygen  Post-op Assessment: Report given to PACU RN and Post -op Vital signs reviewed and stable  Post vital signs: Reviewed and stable  Complications: No apparent anesthesia complications

## 2012-03-10 NOTE — Interval H&P Note (Signed)
History and Physical Interval Note:  03/10/2012 1:32 PM  Randall Cook  has presented today for surgery, with the diagnosis of left knee medial compartamental left knee  The various methods of treatment have been discussed with the patient and family. After consideration of risks, benefits and other options for treatment, the patient has consented to  Procedure(s) (LRB): LEFT UNICOMPARTMENTAL KNEE (Left) as a surgical intervention .  The patient's history has been reviewed, patient examined, no change in status, stable for surgery.  I have reviewed the patients' chart and labs.  Questions were answered to the patient's satisfaction.     [name]

## 2012-03-11 ENCOUNTER — Encounter (HOSPITAL_COMMUNITY): Payer: Self-pay | Admitting: Orthopedic Surgery

## 2012-03-11 LAB — BASIC METABOLIC PANEL
Calcium: 8.6 mg/dL (ref 8.4–10.5)
GFR calc Af Amer: 86 mL/min — ABNORMAL LOW (ref >90)
GFR calc non Af Amer: 74 mL/min — ABNORMAL LOW (ref >90)
Glucose, Bld: 136 mg/dL — ABNORMAL HIGH (ref 70–99)
Potassium: 5.2 mEq/L — ABNORMAL HIGH (ref 3.5–5.1)
Sodium: 132 mEq/L — ABNORMAL LOW (ref 135–145)

## 2012-03-11 LAB — CBC
Hemoglobin: 13.8 g/dL (ref 13.0–17.0)
MCHC: 33.6 g/dL (ref 30.0–36.0)
Platelets: 244 10*3/uL (ref 150–400)
RBC: 4.54 MIL/uL (ref 4.22–5.81)

## 2012-03-11 MED ORDER — METHOCARBAMOL 500 MG PO TABS: 500.0000 mg | ORAL_TABLET | Freq: Four times a day (QID) | ORAL | Status: AC | PRN

## 2012-03-11 MED ORDER — CELECOXIB 200 MG PO CAPS: 200.0000 mg | ORAL_CAPSULE | Freq: Two times a day (BID) | ORAL | Status: AC

## 2012-03-11 MED ORDER — DSS 100 MG PO CAPS: 100.0000 mg | ORAL_CAPSULE | Freq: Two times a day (BID) | ORAL | Status: AC

## 2012-03-11 MED ORDER — HYDROCODONE-ACETAMINOPHEN 7.5-325 MG PO TABS: 1.0000 | ORAL_TABLET | ORAL | Status: AC

## 2012-03-11 MED ORDER — DIPHENHYDRAMINE HCL 25 MG PO CAPS: 25.0000 mg | ORAL_CAPSULE | Freq: Four times a day (QID) | ORAL | Status: DC | PRN

## 2012-03-11 MED ORDER — FERROUS SULFATE 325 (65 FE) MG PO TABS: 325.0000 mg | ORAL_TABLET | Freq: Three times a day (TID) | ORAL | Status: DC

## 2012-03-11 MED ORDER — ASPIRIN EC 325 MG PO TBEC: 325.0000 mg | DELAYED_RELEASE_TABLET | Freq: Two times a day (BID) | ORAL | Status: AC

## 2012-03-11 MED ORDER — POLYETHYLENE GLYCOL 3350 17 G PO PACK: 17.0000 g | PACK | Freq: Two times a day (BID) | ORAL | Status: AC

## 2012-03-11 NOTE — Progress Notes (Signed)
Physical Therapy Treatment Patient Details Name: Randall Cook MRN: [id number] DOB: 02-10-1956 Today's Date: 03/11/2012 Time: 4010-2725 PT Time Calculation (min): 33 min  PT Assessment / Plan / Recommendation Comments on Treatment Session  Pt doing very well with ambulation, exercises and stair training.  Ready for D/c.     Follow Up Recommendations  Home health PT    Barriers to Discharge        Equipment Recommendations  Rolling walker with 5" wheels;3 in 1 bedside comode    Recommendations for Other Services    Frequency 7X/week   Plan Discharge plan remains appropriate    Precautions / Restrictions Precautions Precautions: Knee Precaution Comments: Ambulated w/o knee immobilizer due to pt able to perform SLR with <10 deg lag Restrictions Weight Bearing Restrictions: No Other Position/Activity Restrictions: WBAT   Pertinent Vitals/Pain 6/10    Mobility  Bed Mobility Bed Mobility: Not assessed (Pt was in recliner when PT arrived. ) Transfers Transfers: Sit to Stand;Stand to Sit Sit to Stand: 5: Supervision;With upper extremity assist;With armrests;From chair/3-in-1 Stand to Sit: 5: Supervision;With upper extremity assist;With armrests;To chair/3-in-1 Details for Transfer Assistance: min cues for hand placement  Ambulation/Gait Ambulation/Gait Assistance: 5: Supervision Ambulation Distance (Feet): 300 Feet Assistive device: Rolling walker Ambulation/Gait Assistance Details: Min cues for upright and relaxed posture.   Gait Pattern: Step-to pattern;Step-through pattern Gait velocity: decreased Stairs: Yes Stairs Assistance: 4: Min guard Stair Management Technique: One rail Left;Forwards;Step to pattern;With crutches (single crutch) Number of Stairs: 4     Exercises Total Joint Exercises Ankle Circles/Pumps: AROM;Both;20 reps Quad Sets: Strengthening;Left;10 reps Heel Slides: AROM;Left;10 reps   PT Diagnosis:    PT Problem List:   PT Treatment Interventions:      PT Goals Acute Rehab PT Goals PT Goal Formulation: With patient Time For Goal Achievement: 03/13/12 Potential to Achieve Goals: Good Pt will go Sit to Stand: with modified independence PT Goal: Sit to Stand - Progress: Progressing toward goal Pt will go Stand to Sit: with modified independence PT Goal: Stand to Sit - Progress: Progressing toward goal Pt will Ambulate: >150 feet;with least restrictive assistive device;with supervision PT Goal: Ambulate - Progress: Met Pt will Go Up / Down Stairs: 3-5 stairs;with supervision;with least restrictive assistive device;with rail(s) PT Goal: Up/Down Stairs - Progress: Partly met  Visit Information  Last PT Received On: 03/11/12 Assistance Needed: +1    Subjective Data  Subjective: It doesn't hurt as much as I thought it would.  Patient Stated Goal: To get back to PLOF.    Cognition  Overall Cognitive Status: Appears within functional limits for tasks assessed/performed Arousal/Alertness: Awake/alert Orientation Level: Appears intact for tasks assessed Behavior During Session: [facility] for tasks performed    Balance     End of Session PT - End of Session Activity Tolerance: Patient tolerated treatment well Patient left: in chair;with call bell/phone within reach;with family/visitor present Nurse Communication: Mobility status    Page, Meribeth Mattes 03/11/2012, 2:53 PM

## 2012-03-11 NOTE — Progress Notes (Signed)
OT Note:  Pt screened for OT.  Pt is getting a 3:1 and has assist at home.  He will sponge bathe initially.  [place], [place] [phone] 03/11/2012

## 2012-03-11 NOTE — Progress Notes (Signed)
Subjective: 1 Day Post-Op Procedure(s) (LRB): UNICOMPARTMENTAL KNEE (Left)   Patient reports pain as mild, pain well controlled. No events throughout the night. Ready to be discharged home after PT.  Objective:   VITALS:   Filed Vitals:   03/11/12 0629  BP: 103/70  Pulse: 66  Temp: 97.9 F (36.6 C)  Resp: 16    Neurovascular intact Dorsiflexion/Plantar flexion intact Incision: dressing C/D/I No cellulitis present Compartment soft  LABS  Basename 03/11/12 0413  HGB 13.8  HCT 41.1  WBC 9.5  PLT 244     Basename 03/11/12 0413  NA 132*  K 5.2*  BUN 16  CREATININE 1.10  GLUCOSE 136*     Assessment/Plan: 1 Day Post-Op Procedure(s) (LRB): UNICOMPARTMENTAL KNEE (Left)   HV drain d/c'ed Foley cath d/c'ed Advance diet Up with therapy D/C IV fluids Discharge home with home health Follow up in 2 weeks at [facility].  Follow-up Information    Follow up with OLIN,Xzavian Semmel D in 2 weeks.   Contact information:   [facility] [address] [place] [address] [phone]          Anastasio Auerbach. Matelyn Antonelli   PAC  03/11/2012, 8:22 AM

## 2012-03-11 NOTE — Plan of Care (Signed)
Problem: Consults Goal: Diagnosis- Total Joint Replacement Outcome: Completed/Met Date Met:  03/10/12 Partial/Uniknee

## 2012-03-11 NOTE — Evaluation (Signed)
Physical Therapy Evaluation Patient Details Name: Randall Cook MRN: [id number] DOB: [birth year] Today's Date: 03/11/2012 Time: 4098-1191 PT Time Calculation (min): 32 min  PT Assessment / Plan / Recommendation Clinical Impression  Pt presents s/p L knee unicompartmental replacement POD 1 with decreased strength, ROM and mobility.  Tolerated ambulation in hallway very well with RW with only slight increase in dizziness. BP remained stable throughout session.  Pt will benefit from skilled PT in acute venue to address deficits.  PT recommends HHPT for follow up therapy at D/C to return pt to PLOF.      PT Assessment  Patient needs continued PT services    Follow Up Recommendations  Home health PT    Barriers to Discharge None      lEquipment Recommendations  Rolling walker with 5" wheels;3 in 1 bedside comode    Recommendations for Other Services OT consult   Frequency 7X/week    Precautions / Restrictions Precautions Precautions: Knee Precaution Comments: Ambulated w/o knee immobilizer due to pt able to perform SLR with <10 deg lag Restrictions Weight Bearing Restrictions: No Other Position/Activity Restrictions: WBAT   Pertinent Vitals/Pain 6/10      Mobility  Bed Mobility Bed Mobility: Supine to Sit;Sitting - Scoot to Edge of Bed Supine to Sit: 5: Supervision;HOB elevated (HOB elevated approx 40-50 deg) Sitting - Scoot to Edge of Bed: 5: Supervision Details for Bed Mobility Assistance: Supervision for safety.  Transfers Transfers: Sit to Stand;Stand to Sit Sit to Stand: 4: Min guard;From elevated surface;With upper extremity assist;From bed Stand to Sit: 4: Min guard;With upper extremity assist;With armrests;To chair/3-in-1 Details for Transfer Assistance: Min/guard for safety/steadying with cues for hand placement and controlled descent when sitting.  Ambulation/Gait Ambulation/Gait Assistance: 4: Min guard Ambulation Distance (Feet): 120 Feet Assistive device:  Rolling walker Ambulation/Gait Assistance Details: Cues for sequencing/technique with RW and for upright posture.   Gait Pattern: Step-to pattern Gait velocity: decreased Stairs: No Wheelchair Mobility Wheelchair Mobility: No    Exercises     PT Diagnosis: Difficulty walking;Abnormality of gait;Generalized weakness;Acute pain  PT Problem List: Decreased strength;Decreased activity tolerance;Decreased mobility;Decreased knowledge of use of DME;Pain PT Treatment Interventions: DME instruction;Gait training;Stair training;Functional mobility training;Therapeutic activities;Therapeutic exercise;Balance training;Patient/family education   PT Goals Acute Rehab PT Goals PT Goal Formulation: With patient Time For Goal Achievement: 03/13/12 Potential to Achieve Goals: Good Pt will go Sit to Supine/Side: with supervision PT Goal: Sit to Supine/Side - Progress: Goal set today Pt will go Sit to Stand: with modified independence PT Goal: Sit to Stand - Progress: Goal set today Pt will go Stand to Sit: with modified independence PT Goal: Stand to Sit - Progress: Goal set today Pt will Ambulate: >150 feet;with least restrictive assistive device;with supervision PT Goal: Ambulate - Progress: Goal set today Pt will Go Up / Down Stairs: 3-5 stairs;with supervision;with least restrictive assistive device;with rail(s) PT Goal: Up/Down Stairs - Progress: Goal set today  Visit Information  Last PT Received On: 03/11/12 Assistance Needed: +1    Subjective Data  Subjective: You are here to hurt me aren't you? Patient Stated Goal: To get back to PLOF.    Prior Functioning  Home Living Lives With: Spouse Available Help at Discharge: Family Type of Home: Mobile home Home Access: Stairs to enter Entrance Stairs-Number of Steps: 4 Entrance Stairs-Rails: Right;Left;Can reach both Home Layout: One level Bathroom Shower/Tub: Engineer, manufacturing systems: Standard Bathroom Accessibility: Yes How  Accessible: Accessible via walker Home Adaptive Equipment: Straight cane Prior Function  Level of Independence: Independent Able to Take Stairs?: Yes Driving: Yes Vocation: On disability Communication Communication: No difficulties    Cognition  Overall Cognitive Status: Appears within functional limits for tasks assessed/performed Arousal/Alertness: Awake/alert Orientation Level: Appears intact for tasks assessed Behavior During Session: [facility] for tasks performed    Extremity/Trunk Assessment Right Lower Extremity Assessment RLE ROM/Strength/Tone: Within functional levels RLE Sensation: WFL - Light Touch RLE Coordination: WFL - gross motor Left Lower Extremity Assessment LLE ROM/Strength/Tone: Deficits LLE ROM/Strength/Tone Deficits: able to perform SLR against gravity independently, able to perform LAQ x 10 reps, ankle motion WFL LLE Sensation: WFL - Light Touch LLE Coordination: WFL - gross motor Trunk Assessment Trunk Assessment: Normal   Balance    End of Session PT - End of Session Equipment Utilized During Treatment: Gait belt Activity Tolerance: Patient tolerated treatment well Patient left: in chair;with call bell/phone within reach;with family/visitor present Nurse Communication: Mobility status   Page, Meribeth Mattes 03/11/2012, 9:19 AM

## 2012-03-11 NOTE — Care Management Note (Signed)
    Page 1 of 2   03/11/2012     2:44:53 PM   CARE MANAGEMENT NOTE 03/11/2012  Patient:  Randall Cook, Randall Cook   Account Number:  [id number]  Date Initiated:  03/11/2012  Documentation initiated by:  Colleen Can  Subjective/Objective Assessment:   DX PARTIAL KNEE REPLACEMNT -LEFT-DAY OF ADMISSION    Referral to [facility] from doctor's office     Action/Plan:   CM SPOKE WITH PATIENT AND SPOUSE. Plans are for patient to discharge to his home in [place] where spouse will be caregiver. Pt states he will need RW and 3N1.   Anticipated DC Date:  03/11/2012   Anticipated DC Plan:  HOME W HOME HEALTH SERVICES  In-house referral  NA      DC Planning Services  CM consult      [facility] Choice  HOME HEALTH  DURABLE MEDICAL EQUIPMENT   Choice offered to / List presented to:  C-1 Patient   DME arranged  3-N-1  Levan Hurst      DME agency  [facility]     HH arranged  HH-2 PT      [facility] agency  [facility]   Status of service:  Completed, signed off Medicare Important Message given?   (If response is "NO", the following Medicare IM given date fields will be blank) Date Medicare IM given:   Date Additional Medicare IM given:    Discharge Disposition:  HOME W HOME HEALTH SERVICES  Per UR Regulation:  Reviewed for med. necessity/level of care/duration of stay  If discussed at Long Length of Stay Meetings, dates discussed:    Comments:  03/11/2012 [name] BSN CCM [phone] [facility] choice offered. patient chose [facility]. LIst of hh agenmcies placed in shadow chart. Tct [facility] - Intake-sally/faxed HHpt orders, face sheet, op report and H&P with comfirmation. [facility] will start services thurs-03/13/2012

## 2012-03-14 NOTE — Discharge Summary (Signed)
Physician Discharge Summary  Patient ID: Randall Cook MRN: [id number] DOB/AGE: [id number] 55 y.o.  Admit date: 03/10/2012 Discharge date: 03/11/2012  Procedures:  Procedure(s) (LRB): UNICOMPARTMENTAL KNEE (Left)  Attending Physician:  Dr. Durene Romans   Admission Diagnoses:  Left knee OA and pain    Discharge Diagnoses:  Principal Problem:  *S/P left UKR GERD   Arthritis   Hyperlipidemia   Hypertension   Dysrhythmia  Sleep apnea    HPI: Pt is a 56 y.o. male complaining of left knee pain for 3+ years, over the last year the pain has increase significantly . Pain had continually increased since the beginning. X-rays in the clinic show end-stage arthritic changes of the letf knee. Pt has tried various conservative treatments which have failed to alleviate their symptoms, including NSAIDs and steroid injections. Various options are discussed with the patient. Risks, benefits and expectations were discussed with the patient. Patient understand the risks, benefits and expectations and wishes to proceed with surgery.   PCP: Randall Hoff, MD   Discharged Condition: good  Hospital Course:  Patient underwent the above stated procedure on 03/10/2012. Patient tolerated the procedure well and brought to the recovery room in good condition and subsequently to the floor.  POD #1 BP: 103/70 ; Pulse: 66 ; Temp: 97.9 F (36.6 C) ; Resp: 16  Pt's foley was removed, as well as the hemovac drain removed. IV was changed to a saline lock. Patient reports pain as mild, pain well controlled. No events throughout the night. Ready to be discharged home after PT. Neurovascular intact, dorsiflexion/plantar flexion intact, incision: dressing C/D/I, no cellulitis present and compartment soft.   LABS  Basename  03/11/12 0413   HGB  13.8  HCT  41.1    Discharge Exam: General appearance: alert, cooperative and no distress Extremities: Homans sign is negative, no sign of DVT, no edema, redness or  tenderness in the calves or thighs and no ulcers, gangrene or trophic changes  Disposition: Home  with follow up in 2 weeks   Follow-up Information    Follow up with OLIN,Tashanna Dolin D in 2 weeks.   Contact information:   [facility] [address], [address] [place] [address] [phone]          Discharge Orders    Future Orders Please Complete By Expires   Diet - low sodium heart healthy      Call MD / Call 911      Comments:   If you experience chest pain or shortness of breath, CALL 911 and be transported to the hospital emergency room.  If you develope a fever above 101 F, pus (white drainage) or increased drainage or redness at the wound, or calf pain, call your surgeon's office.   Discharge instructions      Comments:   Maintain surgical dressing for 8 days, then replace with gauze and tape. Keep the area dry and clean until follow up. Follow up in 2 weeks at [facility]. Call with any questions or concerns.   Constipation Prevention      Comments:   Drink plenty of fluids.  Prune juice may be helpful.  You may use a stool softener, such as Colace (over the counter) 100 mg twice a day.  Use MiraLax (over the counter) for constipation as needed.   Increase activity slowly as tolerated      Driving restrictions      Comments:   No driving for 4 weeks   TED  hose      Comments:   Use stockings (TED hose) for 2 weeks on both leg(s).  You may remove them at night for sleeping.   Change dressing      Comments:   Maintain surgical dressing for 8 days, then change the dressing daily with sterile 4 x 4 inch gauze dressing and tape. Keep the area dry and clean.      Discharge Medication List as of 03/11/2012  2:34 PM    START taking these medications   Details  aspirin EC 325 MG tablet Take 1 tablet (325 mg total) by mouth 2 (two) times daily. X 4 weeks, Starting 03/11/2012, Until Fri 03/21/12, No Print    celecoxib (CELEBREX) 200  MG capsule Take 1 capsule (200 mg total) by mouth every 12 (twelve) hours., Starting 03/11/2012, Until Thu 04/10/12, No Print    diphenhydrAMINE (BENADRYL) 25 mg capsule Take 1 capsule (25 mg total) by mouth every 6 (six) hours as needed for itching, allergies or sleep., Starting 03/11/2012, Until Fri 03/21/12, No Print    docusate sodium 100 MG CAPS Take 100 mg by mouth 2 (two) times daily., Starting 03/11/2012, Until Fri 03/21/12, No Print    ferrous sulfate 325 (65 FE) MG tablet Take 1 tablet (325 mg total) by mouth 3 (three) times daily after meals., Starting 03/11/2012, Until Wed 03/11/13, No Print    HYDROcodone-acetaminophen (NORCO) 7.5-325 MG per tablet Take 1-2 tablets by mouth every 4 (four) hours., Starting 03/11/2012, Until Fri 03/21/12, Print    methocarbamol (ROBAXIN) 500 MG tablet Take 1 tablet (500 mg total) by mouth every 6 (six) hours as needed (muscle spasms)., Starting 03/11/2012, Until Fri 03/21/12, No Print    polyethylene glycol (MIRALAX / GLYCOLAX) packet Take 17 g by mouth 2 (two) times daily., Starting 03/11/2012, Until Fri 03/14/12, No Print      CONTINUE these medications which have NOT CHANGED   Details  lisinopril-hydrochlorothiazide (PRINZIDE,ZESTORETIC) 20-12.5 MG per tablet Take 1 tablet by mouth daily. New rx- hasnt started yet 03/04/12, Until Discontinued, Historical Med    nebivolol (BYSTOLIC) 5 MG tablet Take 5 mg by mouth at bedtime., Until Discontinued, Historical Med      STOP taking these medications     Aspirin-Salicylamide-Caffeine (BC HEADACHE PO) Comments:  Reason for Stopping:       meloxicam (MOBIC) 15 MG tablet Comments:  Reason for Stopping:       HYDROcodone-acetaminophen (NORCO) 5-325 MG per tablet Comments:  Reason for Stopping:       ibuprofen (ADVIL,MOTRIN) 600 MG tablet Comments:  Reason for Stopping:           Signed: Anastasio Auerbach. Chanin Frumkin   PAC  03/14/2012, 8:33 AM

## 2012-04-04 ENCOUNTER — Ambulatory Visit: Payer: 59 | Attending: Orthopedic Surgery | Admitting: Physical Therapy

## 2012-04-04 DIAGNOSIS — M25669 Stiffness of unspecified knee, not elsewhere classified: Secondary | ICD-10-CM | POA: Insufficient documentation

## 2012-04-04 DIAGNOSIS — M25569 Pain in unspecified knee: Secondary | ICD-10-CM | POA: Insufficient documentation

## 2012-04-04 DIAGNOSIS — IMO0001 Reserved for inherently not codable concepts without codable children: Secondary | ICD-10-CM | POA: Insufficient documentation

## 2012-04-08 ENCOUNTER — Ambulatory Visit: Payer: 59 | Admitting: Rehabilitation

## 2012-04-10 ENCOUNTER — Ambulatory Visit: Payer: 59 | Admitting: Rehabilitation

## 2012-04-15 ENCOUNTER — Ambulatory Visit: Payer: 59 | Admitting: Rehabilitation

## 2012-04-17 ENCOUNTER — Ambulatory Visit: Payer: 59 | Admitting: Rehabilitation

## 2012-04-22 ENCOUNTER — Ambulatory Visit: Payer: 59 | Admitting: Rehabilitation

## 2012-04-24 ENCOUNTER — Encounter: Payer: 59 | Admitting: Rehabilitation

## 2012-04-29 ENCOUNTER — Ambulatory Visit: Payer: 59 | Attending: Orthopedic Surgery | Admitting: Physical Therapy

## 2012-04-29 DIAGNOSIS — M25569 Pain in unspecified knee: Secondary | ICD-10-CM | POA: Insufficient documentation

## 2012-04-29 DIAGNOSIS — IMO0001 Reserved for inherently not codable concepts without codable children: Secondary | ICD-10-CM | POA: Insufficient documentation

## 2012-04-29 DIAGNOSIS — M25669 Stiffness of unspecified knee, not elsewhere classified: Secondary | ICD-10-CM | POA: Insufficient documentation

## 2012-05-01 ENCOUNTER — Ambulatory Visit: Payer: 59 | Admitting: Rehabilitation

## 2012-05-06 ENCOUNTER — Ambulatory Visit: Payer: 59 | Admitting: Rehabilitation

## 2012-05-09 ENCOUNTER — Ambulatory Visit: Payer: 59 | Admitting: Physical Therapy

## 2012-05-13 ENCOUNTER — Ambulatory Visit: Payer: 59 | Admitting: Rehabilitation

## 2012-05-15 ENCOUNTER — Ambulatory Visit: Payer: 59 | Admitting: Rehabilitation

## 2012-05-19 ENCOUNTER — Ambulatory Visit: Payer: 59 | Admitting: Physical Therapy

## 2012-05-21 ENCOUNTER — Ambulatory Visit: Payer: 59 | Admitting: Physical Therapy

## 2012-05-27 ENCOUNTER — Ambulatory Visit: Payer: 59 | Attending: Family Medicine | Admitting: Rehabilitation

## 2012-05-27 DIAGNOSIS — IMO0001 Reserved for inherently not codable concepts without codable children: Secondary | ICD-10-CM | POA: Insufficient documentation

## 2012-05-27 DIAGNOSIS — M25569 Pain in unspecified knee: Secondary | ICD-10-CM | POA: Insufficient documentation

## 2012-05-29 ENCOUNTER — Ambulatory Visit: Payer: 59 | Attending: Family Medicine | Admitting: Rehabilitation

## 2012-05-29 DIAGNOSIS — M25569 Pain in unspecified knee: Secondary | ICD-10-CM | POA: Insufficient documentation

## 2012-05-29 DIAGNOSIS — M25669 Stiffness of unspecified knee, not elsewhere classified: Secondary | ICD-10-CM | POA: Insufficient documentation

## 2012-05-29 DIAGNOSIS — IMO0001 Reserved for inherently not codable concepts without codable children: Secondary | ICD-10-CM | POA: Insufficient documentation

## 2012-06-04 ENCOUNTER — Ambulatory Visit: Payer: 59 | Admitting: Rehabilitation

## 2012-06-06 ENCOUNTER — Ambulatory Visit: Payer: 59 | Admitting: Rehabilitation

## 2012-06-08 ENCOUNTER — Emergency Department (HOSPITAL_COMMUNITY)
Admission: EM | Admit: 2012-06-08 | Discharge: 2012-06-08 | Disposition: A | Discharge status: Home / Self Care | Payer: 59 | Attending: Emergency Medicine | Admitting: Emergency Medicine

## 2012-06-08 ENCOUNTER — Encounter (HOSPITAL_COMMUNITY): Payer: Self-pay | Admitting: *Deleted

## 2012-06-08 DIAGNOSIS — K029 Dental caries, unspecified: Secondary | ICD-10-CM | POA: Insufficient documentation

## 2012-06-08 DIAGNOSIS — M129 Arthropathy, unspecified: Secondary | ICD-10-CM | POA: Insufficient documentation

## 2012-06-08 DIAGNOSIS — G4730 Sleep apnea, unspecified: Secondary | ICD-10-CM | POA: Insufficient documentation

## 2012-06-08 DIAGNOSIS — I10 Essential (primary) hypertension: Secondary | ICD-10-CM | POA: Insufficient documentation

## 2012-06-08 DIAGNOSIS — E785 Hyperlipidemia, unspecified: Secondary | ICD-10-CM | POA: Insufficient documentation

## 2012-06-08 DIAGNOSIS — K0889 Other specified disorders of teeth and supporting structures: Secondary | ICD-10-CM

## 2012-06-08 DIAGNOSIS — K219 Gastro-esophageal reflux disease without esophagitis: Secondary | ICD-10-CM | POA: Insufficient documentation

## 2012-06-08 DIAGNOSIS — Z87891 Personal history of nicotine dependence: Secondary | ICD-10-CM | POA: Insufficient documentation

## 2012-06-08 MED ORDER — HYDROCODONE-ACETAMINOPHEN 5-325 MG PO TABS: 1.0000 | ORAL_TABLET | ORAL | Status: AC | PRN

## 2012-06-08 MED ORDER — OXYCODONE-ACETAMINOPHEN 5-325 MG PO TABS
1.0000 | ORAL_TABLET | Freq: Once | ORAL | Status: AC
  Administered 2012-06-08: 1 via ORAL
  Filled 2012-06-08: qty 1

## 2012-06-08 MED ORDER — PENICILLIN V POTASSIUM 500 MG PO TABS: 500.0000 mg | ORAL_TABLET | Freq: Three times a day (TID) | ORAL | Status: AC

## 2012-06-08 NOTE — ED Provider Notes (Signed)
Medical screening examination/treatment/procedure(s) were performed by non-physician practitioner and as supervising physician I was immediately available for consultation/collaboration.   Loren Racer, MD 06/08/12 2224

## 2012-06-08 NOTE — ED Notes (Signed)
C/o R jaw swelling and tooth pain, onset last night, has been using oragel all day without relief, recent partial knee replacement surgery 8/17, also took tylenol PM 6 hrs ago. R jaw swelling noted. Pinpoints pain to R upper canine tooth area. Unsure of fever, mentions some possible drainage.

## 2012-06-08 NOTE — ED Provider Notes (Signed)
History     CSN: [id number]  Arrival date & time 06/08/12  2004   First MD Initiated Contact with Patient 06/08/12 2053      Chief Complaint  Patient presents with  . Dental Pain  . Jaw Pain  . Oral Swelling    (Consider location/radiation/quality/duration/timing/severity/associated sxs/prior treatment) HPI History provided by pt.   Pt c/o severe right upper dental pain since this morning.  Pain aggravated by the air touching it.  No associated fever.  Has taken oragel w/out relief. Does not currently have a dentist.    Past Medical History  Diagnosis Date  . GERD (gastroesophageal reflux disease)   . Arthritis   . Hyperlipidemia   . Hypertension     LOV,   Dr Azucena Cecil 02/29/12 on chart-   CBC with diff, CMET 02/29/12 on chart  . Dysrhythmia     tachycardia- EKG,clearance 02/29/12 Dr Azucena Cecil  on chart  . Sleep apnea     STOP BANG SCORE 4    Past Surgical History  Procedure Date  . No past surgeries   . Partial knee arthroplasty 03/10/2012    Procedure: UNICOMPARTMENTAL KNEE;  Surgeon: [name], MD;  Location: [facility];  Service: Orthopedics;  Laterality: Left;    No family history on file.  History  Substance Use Topics  . Smoking status: Former Smoker -- 0.5 packs/day for 20 years    Types: Cigarettes    Quit date: 03/05/1999  . Smokeless tobacco: Never Used  . Alcohol Use: Yes     none x 20 yrs      Review of Systems  All other systems reviewed and are negative.    Allergies  Review of patient's allergies indicates no known allergies.  Home Medications   Current Outpatient Rx  Name Route Sig Dispense Refill  . BENZOCAINE 10 % MT GEL Mouth/Throat Use as directed 1 application in the mouth or throat as needed. For tooth pain    . DIPHENHYDRAMINE-APAP (SLEEP) 25-500 MG PO TABS Oral Take 1 tablet by mouth at bedtime as needed. For tooth pain    . FUROSEMIDE 20 MG PO TABS Oral Take 20 mg by mouth daily.     Marland Kitchen LISINOPRIL-HYDROCHLOROTHIAZIDE 20-12.5 MG PO  TABS Oral Take 1 tablet by mouth daily. New rx- hasnt started yet 03/04/12    . MELOXICAM 7.5 MG PO TABS Oral Take 7.5 mg by mouth daily.    Marland Kitchen HYDROCODONE-ACETAMINOPHEN 5-325 MG PO TABS Oral Take 1 tablet by mouth every 4 (four) hours as needed for pain. 20 tablet 0  . PENICILLIN V POTASSIUM 500 MG PO TABS Oral Take 1 tablet (500 mg total) by mouth 3 (three) times daily. 30 tablet 0    BP 133/98  Pulse 80  Temp 98.7 F (37.1 C) (Oral)  Resp 20  SpO2 95%  Physical Exam  Nursing note and vitals reviewed. Constitutional: He is oriented to person, place, and time. He appears well-developed and well-nourished. No distress.  HENT:  Head: Normocephalic and atraumatic. No trismus in the jaw.  Mouth/Throat: Uvula is midline, oropharynx is clear and moist and mucous membranes are normal. No posterior oropharyngeal edema.       Poor dentition.  Several missing teeth.  Right upper second premolar severely decayed and ttp.  Adjacent gingiva appears normal. No edema, induration or skin changes of buccal mucosa or overlying skin.  No trismus.  Eyes:       Normal appearance  Neck: Normal range of motion.  Neck supple.  Cardiovascular: Normal rate.   Pulmonary/Chest: Effort normal. No respiratory distress.  Musculoskeletal: Normal range of motion.  Lymphadenopathy:    He has no cervical adenopathy.  Neurological: He is alert and oriented to person, place, and time.  Skin: Skin is warm and dry. No rash noted.  Psychiatric: He has a normal mood and affect. His behavior is normal.    ED Course  Dental Date/Time: 06/08/2012 9:47 PM Performed by: Otilio Miu Authorized by: Ruby Cola E Consent: Verbal consent obtained. Risks and benefits: risks, benefits and alternatives were discussed Consent given by: patient Patient understanding: patient states understanding of the procedure being performed Local anesthesia used: yes Local anesthetic: bupivacaine 0.5% without  epinephrine Anesthetic total: 1.8 ml Patient tolerance: Patient tolerated the procedure well with no immediate complications.   (including critical care time)  Labs Reviewed - No data to display No results found.   1. Toothache       MDM  56yo M presents w/ non-traumatic R upper dental pain since this morning.  Possible periapical abscess. Gave a local bupivacaine injection which gave him some relief.  He received one percocet as well.  D/c'd home w/ penicillin, vicodin and referral to dentist.  Return precautions discussed.         Arie Sabina Paloma, [place] 06/08/12 2148

## 2012-06-10 ENCOUNTER — Encounter: Payer: 59 | Admitting: Physical Therapy

## 2012-06-12 ENCOUNTER — Encounter: Payer: 59 | Admitting: Rehabilitation

## 2012-10-01 ENCOUNTER — Ambulatory Visit (INDEPENDENT_AMBULATORY_CARE_PROVIDER_SITE_OTHER): Payer: 59 | Admitting: Emergency Medicine

## 2012-10-01 ENCOUNTER — Ambulatory Visit: Payer: 59

## 2012-10-01 VITALS — BP 133/84 | HR 142 | Temp 99.1°F | Resp 18 | Ht 70.0 in | Wt 228.0 lb

## 2012-10-01 DIAGNOSIS — J189 Pneumonia, unspecified organism: Secondary | ICD-10-CM

## 2012-10-01 DIAGNOSIS — R6889 Other general symptoms and signs: Secondary | ICD-10-CM

## 2012-10-01 DIAGNOSIS — J111 Influenza due to unidentified influenza virus with other respiratory manifestations: Secondary | ICD-10-CM

## 2012-10-01 MED ORDER — CLARITHROMYCIN ER 500 MG PO TB24: 1000.0000 mg | ORAL_TABLET | Freq: Every day | ORAL | Status: DC

## 2012-10-01 MED ORDER — OSELTAMIVIR PHOSPHATE 75 MG PO CAPS: 75.0000 mg | ORAL_CAPSULE | Freq: Two times a day (BID) | ORAL | Status: DC

## 2012-10-01 MED ORDER — HYDROCOD POLST-CHLORPHEN POLST 10-8 MG/5ML PO LQCR: 5.0000 mL | Freq: Two times a day (BID) | ORAL | Status: DC | PRN

## 2012-10-01 NOTE — Progress Notes (Signed)
[facility] [address] [place] [address] [phone]  Date:  10/01/2012   Name:  Randall Cook   DOB:  March 13, 1956   MRN:  [id number]  PCP:  Sissy Hoff, MD    Chief Complaint: Generalized Body Aches, Headache and Cough   History of Present Illness:  Randall Cook is a 57 y.o. very pleasant male patient who presents with the following:  Ill since last night. Headache, fever, chills, cough productive green sputum.  Myalgias and arthralgias, malaise.  Wheezing and some shortness of breath. Nauseated and vomited twice today.  No improvement with OTC medications.   No flu shot.  Wife ill with similar symptoms.  Patient Active Problem List  Diagnosis  . S/P left UKR    Past Medical History  Diagnosis Date  . GERD (gastroesophageal reflux disease)   . Arthritis   . Hyperlipidemia   . Hypertension     LOV,   Dr Azucena Cecil 02/29/12 on chart-   CBC with diff, CMET 02/29/12 on chart  . Dysrhythmia     tachycardia- EKG,clearance 02/29/12 Dr Azucena Cecil  on chart  . Sleep apnea     STOP BANG SCORE 4    Past Surgical History  Procedure Date  . No past surgeries   . Partial knee arthroplasty 03/10/2012    Procedure: UNICOMPARTMENTAL KNEE;  Surgeon: [name], MD;  Location: [facility];  Service: Orthopedics;  Laterality: Left;    History  Substance Use Topics  . Smoking status: Former Smoker -- 0.5 packs/day for 20 years    Types: Cigarettes    Quit date: 03/05/1999  . Smokeless tobacco: Never Used  . Alcohol Use: Yes     Comment: none x 20 yrs    History reviewed. No pertinent family history.  No Known Allergies  Medication list has been reviewed and updated.  Current Outpatient Prescriptions on File Prior to Visit  Medication Sig Dispense Refill  . furosemide (LASIX) 20 MG tablet Take 20 mg by mouth daily.       Marland Kitchen lisinopril-hydrochlorothiazide (PRINZIDE,ZESTORETIC) 20-12.5 MG per tablet Take 1 tablet by mouth daily. New rx- hasnt started yet 03/04/12       . benzocaine (ORAJEL) 10 % mucosal gel Use as directed 1 application in the mouth or throat as needed. For tooth pain      . diphenhydramine-acetaminophen (TYLENOL PM) 25-500 MG TABS Take 1 tablet by mouth at bedtime as needed. For tooth pain      . meloxicam (MOBIC) 7.5 MG tablet Take 7.5 mg by mouth daily.        Review of Systems:  As per HPI, otherwise negative.    Physical Examination: Filed Vitals:   10/01/12 1907  BP: 133/84  Pulse: 142  Temp: 99.1 F (37.3 C)  Resp: 18   Filed Vitals:   10/01/12 1907  Height: 5\' 10"  (1.778 m)  Weight: 228 lb (103.42 kg)   Body mass index is 32.71 kg/(m^2). Ideal Body Weight: Weight in (lb) to have BMI = 25: 173.9   GEN: WDWN, NAD, Non-toxic, A & O x 3 HEENT: Atraumatic, Normocephalic. Neck supple. No masses, No LAD. Ears and Nose: No external deformity. CV: RRR, No M/G/R. No JVD. No thrill. No extra heart sounds. PULM: CTA B, no wheezes, crackles, rhonchi. No retractions. No resp. distress. No accessory muscle use. ABD: S, NT, ND, +BS. No rebound. No HSM. EXTR: No c/c/e NEURO Normal gait.  PSYCH: Normally interactive. Conversant.  Not depressed or anxious appearing.  Calm demeanor.    Assessment and Plan: Influenza Pneumonia tamiflu tussionex biaxin Follow up in two weeks for repeat chest xray  Carmelina Dane, MD  Results for orders placed in visit on 10/01/12  POCT INFLUENZA A/B      Component Value Range   Influenza A, POC Positive     Influenza B, POC Negative     [facility] reading (PRIMARY) by  Dr. Dareen Piano.  Left basilar infiltrate.

## 2012-10-01 NOTE — Patient Instructions (Addendum)
pnPneumonia, Adult Pneumonia is an infection of the lungs.  CAUSES Pneumonia may be caused by bacteria or a virus. Usually, these infections are caused by breathing infectious particles into the lungs (respiratory tract). SYMPTOMS   Cough.  Fever.  Chest pain.  Increased rate of breathing.  Wheezing.  Mucus production. DIAGNOSIS  If you have the common symptoms of pneumonia, your caregiver will typically confirm the diagnosis with a chest X-ray. The X-ray will show an abnormality in the lung (pulmonary infiltrate) if you have pneumonia. Other tests of your blood, urine, or sputum may be done to find the specific cause of your pneumonia. Your caregiver may also do tests (blood gases or pulse oximetry) to see how well your lungs are working. TREATMENT  Some forms of pneumonia may be spread to other people when you cough or sneeze. You may be asked to wear a mask before and during your exam. Pneumonia that is caused by bacteria is treated with antibiotic medicine. Pneumonia that is caused by the influenza virus may be treated with an antiviral medicine. Most other viral infections must run their course. These infections will not respond to antibiotics.  PREVENTION A pneumococcal shot (vaccine) is available to prevent a common bacterial cause of pneumonia. This is usually suggested for:  People over 32 years old.  Patients on chemotherapy.  People with chronic lung problems, such as bronchitis or emphysema.  People with immune system problems. If you are over 65 or have a high risk condition, you may receive the pneumococcal vaccine if you have not received it before. In some countries, a routine influenza vaccine is also recommended. This vaccine can help prevent some cases of pneumonia.You may be offered the influenza vaccine as part of your care. If you smoke, it is time to quit. You may receive instructions on how to stop smoking. Your caregiver can provide medicines and counseling  to help you quit. HOME CARE INSTRUCTIONS   Cough suppressants may be used if you are losing too much rest. However, coughing protects you by clearing your lungs. You should avoid using cough suppressants if you can.  Your caregiver may have prescribed medicine if he or she thinks your pneumonia is caused by a bacteria or influenza. Finish your medicine even if you start to feel better.  Your caregiver may also prescribe an expectorant. This loosens the mucus to be coughed up.  Only take over-the-counter or prescription medicines for pain, discomfort, or fever as directed by your caregiver.  Do not smoke. Smoking is a common cause of bronchitis and can contribute to pneumonia. If you are a smoker and continue to smoke, your cough may last several weeks after your pneumonia has cleared.  A cold steam vaporizer or humidifier in your room or home may help loosen mucus.  Coughing is often worse at night. Sleeping in a semi-upright position in a recliner or using a couple pillows under your head will help with this.  Get rest as you feel it is needed. Your body will usually let you know when you need to rest. SEEK IMMEDIATE MEDICAL CARE IF:   Your illness becomes worse. This is especially true if you are elderly or weakened from any other disease.  You cannot control your cough with suppressants and are losing sleep.  You begin coughing up blood.  You develop pain which is getting worse or is uncontrolled with medicines.  You have a fever.  Any of the symptoms which initially brought you in for treatment  are getting worse rather than better.  You develop shortness of breath or chest pain. MAKE SURE YOU:   Understand these instructions.  Will watch your condition.  Will get help right away if you are not doing well or get worse. Document Released: 09/10/2005 Document Revised: 12/03/2011 Document Reviewed: 11/30/2010 [facility] Patient Information 2013 [place], [place]. Influenza A  (H1N1) H1N1 formerly called "swine flu" is a new influenza virus causing sickness in people. The H1N1 virus is different from seasonal influenza viruses. However, the H1N1 symptoms are similar to seasonal influenza and it is spread from person to person. You may be at higher risk for serious problems if you have underlying serious medical conditions. The CDC and the [employer] are following reported cases around the world. CAUSES   The flu is thought to spread mainly person-to-person through coughing or sneezing of infected people.  A person may become infected by touching something with the virus on it and then touching their mouth or nose. SYMPTOMS   Fever.  Headache.  Tiredness.  Cough.  Sore throat.  Runny or stuffy nose.  Body aches.  Diarrhea and vomiting These symptoms are referred to as "flu-like symptoms." A lot of different illnesses, including the common cold, may have similar symptoms. DIAGNOSIS   There are tests that can tell if you have the H1N1 virus.  Confirmed cases of H1N1 will be reported to the state or local health department.  A doctor's exam may be needed to tell whether you have an infection that is a complication of the flu. HOME CARE INSTRUCTIONS   Stay informed. Visit the [facility] website for current recommendations. Visit [employer]. You may also call 1-800-CDC-INFO ((534)835-8403).  Get help early if you develop any of the above symptoms.  If you are at high risk from complications of the flu, talk to your caregiver as soon as you develop flu-like symptoms. Those at higher risk for complications include:  People 65 years or older.  People with chronic medical conditions.  Pregnant women.  Young children.  Your caregiver may recommend antiviral medicine to help treat the flu.  If you get the flu, get plenty of rest, drink enough water and fluids to keep your urine clear or pale yellow, and avoid using alcohol or  tobacco.  You may take over-the-counter medicine to relieve the symptoms of the flu if your caregiver approves. (Never give aspirin to children or teenagers who have flu-like symptoms, particularly fever). TREATMENT  If you do get sick, antiviral drugs are available. These drugs can make your illness milder and make you feel better faster. Treatment should start soon after illness starts. It is only effective if taken within the first day of becoming ill. Only your caregiver can prescribe antiviral medication.  PREVENTION   Cover your nose and mouth with a tissue or your arm when you cough or sneeze. Throw the tissue away.  Wash your hands often with soap and warm water, especially after you cough or sneeze. Alcohol-based cleaners are also effective against germs.  Avoid touching your eyes, nose or mouth. This is one way germs spread.  Try to avoid contact with sick people. Follow public health advice regarding school closures. Avoid crowds.  Stay home if you get sick. Limit contact with others to keep from infecting them. People infected with the H1N1 virus may be able to infect others anywhere from 1 day before feeling sick to 5-7 days after getting flu symptoms.  An H1N1 vaccine is available to  help protect against the virus. In addition to the H1N1 vaccine, you will need to be vaccinated for seasonal influenza. The H1N1 and seasonal vaccines may be given on the same day. The CDC especially recommends the H1N1 vaccine for:  Pregnant women.  People who live with or care for children younger than 53 months of age.  Health care and emergency services personnel.  Persons between the ages of 53 months through 55 years of age.  People from ages 71 through 15 years who are at higher risk for H1N1 because of chronic health disorders or immune system problems. FACEMASKS In community and home settings, the use of facemasks and N95 respirators are not normally recommended. In certain  circumstances, a facemask or N95 respirator may be used for persons at increased risk of severe illness from influenza. Your caregiver can give additional recommendations for facemask use. IN CHILDREN, EMERGENCY WARNING SIGNS THAT NEED URGENT MEDICAL CARE:  Fast breathing or trouble breathing.  Bluish skin color.  Not drinking enough fluids.  Not waking up or not interacting normally.  Being so fussy that the child does not want to be held.  Your child has an oral temperature above 102 F (38.9 C), not controlled by medicine.  Your baby is older than 3 months with a rectal temperature of 102 F (38.9 C) or higher.  Your baby is 37 months old or younger with a rectal temperature of 100.4 F (38 C) or higher.  Flu-like symptoms improve but then return with fever and worse cough. IN ADULTS, EMERGENCY WARNING SIGNS THAT NEED URGENT MEDICAL CARE:  Difficulty breathing or shortness of breath.  Pain or pressure in the chest or abdomen.  Sudden dizziness.  Confusion.  Severe or persistent vomiting.  Bluish color.  You have a oral temperature above 102 F (38.9 C), not controlled by medicine.  Flu-like symptoms improve but return with fever and worse cough. SEEK IMMEDIATE MEDICAL CARE IF:  You or someone you know is experiencing any of the above symptoms. When you arrive at the emergency center, report that you think you have the flu. You may be asked to wear a mask and/or sit in a secluded area to protect others from getting sick. MAKE SURE YOU:   Understand these instructions.  Will watch your condition.  Will get help right away if you are not doing well or get worse. Some of this information courtesy of the CDC.  Document Released: 02/27/2008 Document Revised: 12/03/2011 Document Reviewed: 02/27/2008 [facility] Patient Information 2013 [place], [place].

## 2012-10-04 NOTE — Progress Notes (Signed)
Reviewed and agree.

## 2013-05-31 ENCOUNTER — Emergency Department (HOSPITAL_COMMUNITY)
Admission: EM | Admit: 2013-05-31 | Discharge: 2013-05-31 | Disposition: A | Discharge status: Home / Self Care | Payer: 59 | Attending: Emergency Medicine | Admitting: Emergency Medicine

## 2013-05-31 ENCOUNTER — Encounter (HOSPITAL_COMMUNITY): Payer: Self-pay | Admitting: *Deleted

## 2013-05-31 DIAGNOSIS — Z862 Personal history of diseases of the blood and blood-forming organs and certain disorders involving the immune mechanism: Secondary | ICD-10-CM | POA: Insufficient documentation

## 2013-05-31 DIAGNOSIS — I10 Essential (primary) hypertension: Secondary | ICD-10-CM | POA: Insufficient documentation

## 2013-05-31 DIAGNOSIS — R220 Localized swelling, mass and lump, head: Secondary | ICD-10-CM | POA: Insufficient documentation

## 2013-05-31 DIAGNOSIS — M129 Arthropathy, unspecified: Secondary | ICD-10-CM | POA: Insufficient documentation

## 2013-05-31 DIAGNOSIS — Z792 Long term (current) use of antibiotics: Secondary | ICD-10-CM | POA: Insufficient documentation

## 2013-05-31 DIAGNOSIS — K089 Disorder of teeth and supporting structures, unspecified: Secondary | ICD-10-CM | POA: Insufficient documentation

## 2013-05-31 DIAGNOSIS — Z8639 Personal history of other endocrine, nutritional and metabolic disease: Secondary | ICD-10-CM | POA: Insufficient documentation

## 2013-05-31 DIAGNOSIS — Z791 Long term (current) use of non-steroidal anti-inflammatories (NSAID): Secondary | ICD-10-CM | POA: Insufficient documentation

## 2013-05-31 DIAGNOSIS — K0889 Other specified disorders of teeth and supporting structures: Secondary | ICD-10-CM

## 2013-05-31 DIAGNOSIS — Z87891 Personal history of nicotine dependence: Secondary | ICD-10-CM | POA: Insufficient documentation

## 2013-05-31 MED ORDER — HYDROCODONE-ACETAMINOPHEN 5-325 MG PO TABS: 1.0000 | ORAL_TABLET | Freq: Four times a day (QID) | ORAL | Status: DC | PRN

## 2013-05-31 MED ORDER — PENICILLIN V POTASSIUM 500 MG PO TABS: 500.0000 mg | ORAL_TABLET | Freq: Four times a day (QID) | ORAL | Status: AC

## 2013-05-31 NOTE — ED Provider Notes (Signed)
CSN: [id number]     Arrival date & time 05/31/13  4098 History   First MD Initiated Contact with Patient 05/31/13 0617     Chief Complaint  Patient presents with  . Dental Pain   (Consider location/radiation/quality/duration/timing/severity/associated sxs/prior Treatment) HPI Comments: Patient presenting with pain of his two front teeth.  He reports that the pain has been present since yesterday morning and has been constant since that time.  He has taken Oragel for the pain and other OTC topical medications without relief.  He reports that he currently does not have a dentist.  Patient is a 57 y.o. male presenting with tooth pain. The history is provided by the patient.  Dental Pain Location:  Upper Onset quality:  Gradual Progression:  Worsening Associated symptoms: gum swelling   Associated symptoms: no difficulty swallowing, no drooling, no facial swelling, no fever, no headaches, no neck pain, no neck swelling and no trismus     Past Medical History  Diagnosis Date  . GERD (gastroesophageal reflux disease)   . Arthritis   . Hyperlipidemia   . Hypertension     LOV,   Dr Azucena Cecil 02/29/12 on chart-   CBC with diff, [facility]ET 02/29/12 on chart  . Dysrhythmia     tachycardia- EKG,clearance 02/29/12 Dr Azucena Cecil  on chart  . Sleep apnea     STOP BANG SCORE 4   Past Surgical History  Procedure Laterality Date  . No past surgeries    . Partial knee arthroplasty  03/10/2012    Procedure: UNICOMPARTMENTAL KNEE;  Surgeon: [name], MD;  Location: [facility];  Service: Orthopedics;  Laterality: Left;   History reviewed. No pertinent family history. History  Substance Use Topics  . Smoking status: Former Smoker -- 0.50 packs/day for 20 years    Types: Cigarettes    Quit date: 03/05/1999  . Smokeless tobacco: Never Used  . Alcohol Use: Yes     Comment: none x 20 yrs    Review of Systems  Constitutional: Negative for fever.  HENT: Positive for dental problem. Negative for facial swelling,  drooling and neck pain.   Neurological: Negative for headaches.  All other systems reviewed and are negative.    Allergies  Review of patient's allergies indicates no known allergies.  Home Medications   Current Outpatient Rx  Name  Route  Sig  Dispense  Refill  . benzocaine (ORAJEL) 10 % mucosal gel   Mouth/Throat   Use as directed 1 application in the mouth or throat as needed. For tooth pain         . chlorpheniramine-HYDROcodone (TUSSIONEX PENNKINETIC ER) 10-8 MG/5ML LQCR   Oral   Take 5 mLs by mouth every 12 (twelve) hours as needed (cough).   60 mL   0   . clarithromycin (BIAXIN XL) 500 MG 24 hr tablet   Oral   Take 2 tablets (1,000 mg total) by mouth daily.   20 tablet   0   . diphenhydramine-acetaminophen (TYLENOL PM) 25-500 MG TABS   Oral   Take 1 tablet by mouth at bedtime as needed. For tooth pain         . furosemide (LASIX) 20 MG tablet   Oral   Take 20 mg by mouth daily.          Marland Kitchen lisinopril-hydrochlorothiazide (PRINZIDE,ZESTORETIC) 20-12.5 MG per tablet   Oral   Take 1 tablet by mouth daily. New rx- hasnt started yet 03/04/12         .  meloxicam (MOBIC) 7.5 MG tablet   Oral   Take 7.5 mg by mouth daily.         Marland Kitchen oseltamivir (TAMIFLU) 75 MG capsule   Oral   Take 1 capsule (75 mg total) by mouth 2 (two) times daily.   10 capsule   0    BP 146/97  Pulse 108  Temp(Src) 98.4 F (36.9 C) (Oral)  Resp 18  Ht 6\' 1"  (1.854 m)  Wt 240 lb (108.863 kg)  BMI 31.67 kg/m2  SpO2 95% Physical Exam  Nursing note and vitals reviewed. Constitutional: He appears well-developed and well-nourished. No distress.  HENT:  Head: Normocephalic and atraumatic.  Mouth/Throat: Uvula is midline, oropharynx is clear and moist and mucous membranes are normal. No trismus in the jaw. Abnormal dentition. No dental abscesses or edematous. No oropharyngeal exudate, posterior oropharyngeal edema, posterior oropharyngeal erythema or tonsillar abscesses.    Poor  dental hygiene. Pt able to open and close mouth with out difficulty. Airway intact. Uvula midline. Mild gingival swelling with tenderness over affected area, but no fluctuance. No swelling or tenderness of submental and submandibular regions.  No sublingual tenderness or tongue elevation  Eyes: EOM are normal.  Neck: Normal range of motion and full passive range of motion without pain. Neck supple.  Cardiovascular: Normal rate and regular rhythm.   Pulmonary/Chest: Effort normal and breath sounds normal. No respiratory distress. He has no wheezes.  Musculoskeletal: Normal range of motion.  Lymphadenopathy:       Head (right side): No submental, no submandibular, no tonsillar, no preauricular and no posterior auricular adenopathy present.       Head (left side): No submental, no submandibular, no tonsillar, no preauricular and no posterior auricular adenopathy present.    He has no cervical adenopathy.  Neurological: He is alert.  Skin: Skin is warm and dry. No rash noted. He is not diaphoretic.  Psychiatric: He has a normal mood and affect.    ED Course  Procedures (including critical care time) Labs Review Labs Reviewed - No data to display Imaging Review No results found.  MDM  No diagnosis found. Patient with toothache.  No gross abscess.  Exam unconcerning for Ludwig's angina or spread of infection.  Will treat with penicillin and pain medicine.  Urged patient to follow-up with dentist.       Pascal Lux [place], PA-C 05/31/13 [phone]

## 2013-05-31 NOTE — ED Provider Notes (Addendum)
Medical screening examination/treatment/procedure(s) were performed by non-physician practitioner and as supervising physician I was immediately available for consultation/collaboration.   Brandt Loosen, MD 06/26/13 [phone]

## 2013-05-31 NOTE — ED Notes (Signed)
C/o dental pain, also some nausea and fever (subjective), denies other sx, onset yesterday morning, pinpoints to L upper central tooth and maxilla below L nare, has tried topical anesthetics.

## 2013-06-17 ENCOUNTER — Emergency Department (HOSPITAL_COMMUNITY)
Admission: EM | Admit: 2013-06-17 | Discharge: 2013-06-17 | Disposition: A | Discharge status: Home / Self Care | Payer: 59 | Attending: Emergency Medicine | Admitting: Emergency Medicine

## 2013-06-17 ENCOUNTER — Encounter (HOSPITAL_COMMUNITY): Payer: Self-pay | Admitting: *Deleted

## 2013-06-17 DIAGNOSIS — Z8719 Personal history of other diseases of the digestive system: Secondary | ICD-10-CM | POA: Insufficient documentation

## 2013-06-17 DIAGNOSIS — Z87891 Personal history of nicotine dependence: Secondary | ICD-10-CM | POA: Insufficient documentation

## 2013-06-17 DIAGNOSIS — R21 Rash and other nonspecific skin eruption: Secondary | ICD-10-CM | POA: Insufficient documentation

## 2013-06-17 DIAGNOSIS — T7840XA Allergy, unspecified, initial encounter: Secondary | ICD-10-CM

## 2013-06-17 DIAGNOSIS — I10 Essential (primary) hypertension: Secondary | ICD-10-CM | POA: Insufficient documentation

## 2013-06-17 DIAGNOSIS — Z79899 Other long term (current) drug therapy: Secondary | ICD-10-CM | POA: Insufficient documentation

## 2013-06-17 DIAGNOSIS — L509 Urticaria, unspecified: Secondary | ICD-10-CM | POA: Insufficient documentation

## 2013-06-17 DIAGNOSIS — Z8739 Personal history of other diseases of the musculoskeletal system and connective tissue: Secondary | ICD-10-CM | POA: Insufficient documentation

## 2013-06-17 DIAGNOSIS — Z862 Personal history of diseases of the blood and blood-forming organs and certain disorders involving the immune mechanism: Secondary | ICD-10-CM | POA: Insufficient documentation

## 2013-06-17 DIAGNOSIS — Z8639 Personal history of other endocrine, nutritional and metabolic disease: Secondary | ICD-10-CM | POA: Insufficient documentation

## 2013-06-17 MED ORDER — DEXAMETHASONE SODIUM PHOSPHATE 10 MG/ML IJ SOLN
10.0000 mg | Freq: Once | INTRAMUSCULAR | Status: AC
  Administered 2013-06-17: 10 mg via INTRAMUSCULAR
  Filled 2013-06-17: qty 1

## 2013-06-17 MED ORDER — FAMOTIDINE 20 MG PO TABS
20.0000 mg | ORAL_TABLET | Freq: Once | ORAL | Status: AC
  Administered 2013-06-17: 20 mg via ORAL
  Filled 2013-06-17: qty 1

## 2013-06-17 MED ORDER — FAMOTIDINE 20 MG PO TABS: 20.0000 mg | ORAL_TABLET | Freq: Two times a day (BID) | ORAL | Status: DC

## 2013-06-17 MED ORDER — HYDROXYZINE HCL 10 MG PO TABS: 10.0000 mg | ORAL_TABLET | Freq: Four times a day (QID) | ORAL | Status: DC | PRN

## 2013-06-17 MED ORDER — DIPHENHYDRAMINE HCL 25 MG PO CAPS
50.0000 mg | ORAL_CAPSULE | Freq: Once | ORAL | Status: AC
  Administered 2013-06-17: 50 mg via ORAL
  Filled 2013-06-17: qty 2

## 2013-06-17 NOTE — ED Provider Notes (Signed)
CSN: [id number]     Arrival date & time 06/17/13  1956 History  This chart was scribed for non-physician practitioner, Junious Silk, PA-C working with Shelda Jakes, MD by Greggory Stallion, ED scribe. This patient was seen in room TR07C/TR07C and the patient's care was started at 10:00 PM.   Chief Complaint  Patient presents with  . Rash   The history is provided by the patient. No language interpreter was used.    HPI Comments: Randall Cook is a 57 y.o. male who presents to the Emergency Department complaining of gradual onset, gradually worsening itchy rash that started 4 days ago. He states it started on his abdomen then spread to his arms and legs. Pt states he started Penicillin for a dental problem one week ago and was also outside throwing a cookout this weekend. He has taken penicillin before with no reaction to it. Pt first tried rubbing alcohol with no relief then used hydrocortisone cream yesterday with temporary relief. He states his throat did feel like it was swelling up, but has improved. He denies having any new soaps or detergents. Pt denies trouble breathing and SOB.   Past Medical History  Diagnosis Date  . GERD (gastroesophageal reflux disease)   . Arthritis   . Hyperlipidemia   . Hypertension     LOV,   Dr Azucena Cecil 02/29/12 on chart-   CBC with diff, CMET 02/29/12 on chart  . Dysrhythmia     tachycardia- EKG,clearance 02/29/12 Dr Azucena Cecil  on chart  . Sleep apnea     STOP BANG SCORE 4   Past Surgical History  Procedure Laterality Date  . No past surgeries    . Partial knee arthroplasty  03/10/2012    Procedure: UNICOMPARTMENTAL KNEE;  Surgeon: [name], MD;  Location: [facility];  Service: Orthopedics;  Laterality: Left;   History reviewed. No pertinent family history. History  Substance Use Topics  . Smoking status: Former Smoker -- 0.50 packs/day for 20 years    Types: Cigarettes    Quit date: 03/05/1999  . Smokeless tobacco: Never Used  . Alcohol Use: Yes      Comment: none x 20 yrs    Review of Systems  Respiratory: Negative for shortness of breath.   Skin: Positive for rash.  All other systems reviewed and are negative.    Allergies  Review of patient's allergies indicates no known allergies.  Home Medications   Current Outpatient Rx  Name  Route  Sig  Dispense  Refill  . benzocaine (ORAJEL) 10 % mucosal gel   Mouth/Throat   Use as directed 1 application in the mouth or throat as needed for pain. For tooth pain         . diphenhydramine-acetaminophen (TYLENOL PM) 25-500 MG TABS   Oral   Take 1 tablet by mouth at bedtime as needed (pain).          . Eugenol SOLN   Topical   Apply 1 application topically every 2 (two) hours as needed (tooth pain).         Marland Kitchen HYDROcodone-acetaminophen (NORCO/VICODIN) 5-325 MG per tablet   Oral   Take 1-2 tablets by mouth every 6 (six) hours as needed for pain.   15 tablet   0   . Multiple Vitamin (MULTIVITAMIN WITH MINERALS) TABS tablet   Oral   Take 1 tablet by mouth daily. Geritol         . OVER THE COUNTER MEDICATION   Topical  Apply 1 application topically every 2 (two) hours as needed (tooth pain). Benzocaine 20% liquid          BP 158/96  Pulse 84  Temp(Src) 98.7 F (37.1 C) (Oral)  Resp 16  SpO2 93%  Physical Exam  Nursing note and vitals reviewed. Constitutional: He is oriented to person, place, and time. He appears well-developed and well-nourished. No distress.  Speaking in full sentences.   HENT:  Head: Normocephalic and atraumatic.  Right Ear: External ear normal.  Left Ear: External ear normal.  Nose: Nose normal.  No uvula swelling. Maintaining airway and secretions.   Eyes: Conjunctivae are normal.  Neck: Normal range of motion. No tracheal deviation present.  Cardiovascular: Normal rate, regular rhythm and normal heart sounds.   Pulmonary/Chest: Effort normal and breath sounds normal. No stridor.  Abdominal: Soft. He exhibits no distension. There  is no tenderness.  Musculoskeletal: Normal range of motion.  Neurological: He is alert and oriented to person, place, and time.  Skin: Skin is warm and dry. He is not diaphoretic.  Urticaria on arms bilaterally, worse on right.   Psychiatric: He has a normal mood and affect. His behavior is normal.    ED Course  Procedures (including critical care time)  DIAGNOSTIC STUDIES: Oxygen Saturation is 93% on RA, adequate by my interpretation.    COORDINATION OF CARE: 10:03 PM-Discussed treatment plan which includes Benadryl, pepcid and a steroid with pt at bedside and pt agreed to plan.   Labs Review Labs Reviewed - No data to display Imaging Review No results found.  MDM   1. Allergic reaction, initial encounter    Patient evaluated prior to dc, is hemodynamically stable, in no respiratory distress, and denies the feeling of throat closing. Pt has been advised to take OTC benadryl & return to the ED if they have a mod-severe allergic rxn (s/s including throat closing, difficulty breathing, swelling of lips face or tongue). Pt is to follow up with their PCP. Pt is agreeable with plan & verbalizes understanding.   I personally performed the services described in this documentation, which was scribed in my presence. The recorded information has been reviewed and is accurate.    Mora Bellman, PA-C 06/18/13 0126

## 2013-06-17 NOTE — ED Notes (Signed)
Pt. Stated, I started braking out on Monday with a rash and itching.  i took Cortisone and its no better.  Pt. Has been taking Penicillin for 2 weeks for a abscess tooth.

## 2013-06-17 NOTE — ED Notes (Signed)
Pt states rash that started on his arms and then started spreading to his back. Pt states that he started penicillin (for dental reason) about a week ago and then he was outside this weekend. Pt also ate something and states that his throat was burning after (sausage and green peppers.) Pt states that he is using cortisone cream and that it has been helping. Pt states that he started the cream Sunday.

## 2013-06-18 NOTE — ED Provider Notes (Signed)
Medical screening examination/treatment/procedure(s) were performed by non-physician practitioner and as supervising physician I was immediately available for consultation/collaboration.   Shelda Jakes, MD 06/18/13 [phone]

## 2013-07-27 ENCOUNTER — Ambulatory Visit (INDEPENDENT_AMBULATORY_CARE_PROVIDER_SITE_OTHER): Payer: 59 | Admitting: Family Medicine

## 2013-07-27 VITALS — BP 120/80 | HR 90 | Temp 97.8°F | Resp 16 | Ht 71.5 in | Wt 240.0 lb

## 2013-07-27 DIAGNOSIS — R079 Chest pain, unspecified: Secondary | ICD-10-CM

## 2013-07-27 DIAGNOSIS — R269 Unspecified abnormalities of gait and mobility: Secondary | ICD-10-CM

## 2013-07-27 DIAGNOSIS — L299 Pruritus, unspecified: Secondary | ICD-10-CM

## 2013-07-27 DIAGNOSIS — R21 Rash and other nonspecific skin eruption: Secondary | ICD-10-CM

## 2013-07-27 DIAGNOSIS — N35919 Unspecified urethral stricture, male, unspecified site: Secondary | ICD-10-CM

## 2013-07-27 DIAGNOSIS — Z8679 Personal history of other diseases of the circulatory system: Secondary | ICD-10-CM

## 2013-07-27 LAB — LIPID PANEL
LDL Cholesterol: 125 mg/dL — ABNORMAL HIGH (ref 0–99)
Total CHOL/HDL Ratio: 4.1 Ratio
VLDL: 21 mg/dL (ref 0–40)

## 2013-07-27 LAB — COMPREHENSIVE METABOLIC PANEL
ALT: 11 U/L (ref 0–53)
AST: 14 U/L (ref 0–37)
CO2: 27 mEq/L (ref 19–32)
Creat: 0.91 mg/dL (ref 0.50–1.35)
Sodium: 137 mEq/L (ref 135–145)
Total Bilirubin: 0.4 mg/dL (ref 0.3–1.2)
Total Protein: 7.1 g/dL (ref 6.0–8.3)

## 2013-07-27 LAB — POCT CBC
Hemoglobin: 15.8 g/dL (ref 14.1–18.1)
Lymph, poc: 2.1 (ref 0.6–3.4)
MCH, POC: 31.2 pg (ref 27–31.2)
MCHC: 31.7 g/dL — AB (ref 31.8–35.4)
MID (cbc): 0.5 (ref 0–0.9)
MPV: 8.3 fL (ref 0–99.8)
POC Granulocyte: 5.6 (ref 2–6.9)
POC MID %: 6.7 %M (ref 0–12)
Platelet Count, POC: 274 10*3/uL (ref 142–424)
RBC: 5.06 M/uL (ref 4.69–6.13)
WBC: 8.2 10*3/uL (ref 4.6–10.2)

## 2013-07-27 LAB — POCT SEDIMENTATION RATE: POCT SED RATE: 17 mm/hr (ref 0–22)

## 2013-07-27 NOTE — Progress Notes (Signed)
[facility] [address], [place] [address] [phone]  Date:  07/27/2013   Name:  Randall Cook   DOB:  28-Jan-1956   MRN:  [id number]  PCP:  Sissy Hoff, MD    Chief Complaint: Blood pressure check   History of Present Illness:  Randall Cook is a 57 y.o. very pleasant male patient who presents with the following:  Here today to follow-up HTN.  He would also like to get a "check- up to be sure everything is ok."   He was on BP medication about one year ago.  Maybe lisinopril but he is not sure.  He is no longer taking it.  He does not usually check his BP and does not know if he needs this medication any more  He did eat this morning.    He took some penicillin in September for a dental infection.  He then broke out in a rash on 9/24.  He went to the ER, was given pepcid and atarax and allowed to go home.  He still sometimes notes a rash and itching off and on.  It is better but not 100% gone  Also, at the end of the visit he admits he has been having chest pains.  This pain seems to move around his chest and he has noted it for about 6 weeks.  The pains may last a few seconds.  He has not noted any relationship to exertion.   He does not have any history of CAD.  He is not sure about his family history.   He did smoke a long time- quit about 30 years ago.   Patient Active Problem List   Diagnosis Date Noted  . S/P left [facility] 03/10/2012    Past Medical History  Diagnosis Date  . GERD (gastroesophageal reflux disease)   . Arthritis   . Hyperlipidemia   . Hypertension     LOV,   Dr Azucena Cecil 02/29/12 on chart-   CBC with diff, CMET 02/29/12 on chart  . Dysrhythmia     tachycardia- EKG,clearance 02/29/12 Dr Azucena Cecil  on chart  . Sleep apnea     STOP BANG SCORE 4    Past Surgical History  Procedure Laterality Date  . No past surgeries    . Partial knee arthroplasty  03/10/2012    Procedure: UNICOMPARTMENTAL KNEE;  Surgeon: [name], MD;  Location:  [facility];  Service: Orthopedics;  Laterality: Left;    History  Substance Use Topics  . Smoking status: Former Smoker -- 0.50 packs/day for 20 years    Types: Cigarettes    Quit date: 03/05/1999  . Smokeless tobacco: Never Used  . Alcohol Use: Yes     Comment: none x 20 yrs    History reviewed. No pertinent family history.  Allergies  Allergen Reactions  . Penicillins Rash    Medication list has been reviewed and updated.  Current Outpatient Prescriptions on File Prior to Visit  Medication Sig Dispense Refill  . diphenhydramine-acetaminophen (TYLENOL PM) 25-500 MG TABS Take 1 tablet by mouth at bedtime as needed (pain).       Marland Kitchen HYDROcodone-acetaminophen (NORCO/VICODIN) 5-325 MG per tablet Take 1-2 tablets by mouth every 6 (six) hours as needed for pain.  15 tablet  0  . hydrocortisone 1 % ointment Apply 1 application topically 2 (two) times daily.      . hydrOXYzine (ATARAX/VISTARIL) 10 MG tablet Take 1 tablet (10 mg total) by  mouth every 6 (six) hours as needed for itching.  30 tablet  0  . Multiple Vitamin (MULTIVITAMIN WITH MINERALS) TABS tablet Take 1 tablet by mouth daily. Geritol      . penicillin v potassium (VEETID) 500 MG tablet Take 500 mg by mouth 4 (four) times daily.      . famotidine (PEPCID) 20 MG tablet Take 1 tablet (20 mg total) by mouth 2 (two) times daily.  30 tablet  0   No current facility-administered medications on file prior to visit.    Review of Systems:  As per HPI- otherwise negative. He is not currently having any CP  Physical Examination: Filed Vitals:   07/27/13 1042  BP: 120/80  Pulse: 90  Temp: 97.8 F (36.6 C)  Resp: 16   Filed Vitals:   07/27/13 1042  Height: 5' 11.5" (1.816 m)  Weight: 240 lb (108.863 kg)   Body mass index is 33.01 kg/(m^2). Ideal Body Weight: Weight in (lb) to have BMI = 25: 181.4  GEN: WDWN, NAD, Non-toxic, A & O x 3, overweight, looks well HEENT: Atraumatic, Normocephalic. Neck supple. No masses, No LAD.   Bilateral TM wnl, oropharynx normal.  PEERL,EOMI.   No visible rash on skin, no hives Ears and Nose: No external deformity. CV: RRR, No M/G/R. No JVD. No thrill. No extra heart sounds. PULM: CTA B, no wheezes, crackles, rhonchi. No retractions. No resp. distress. No accessory muscle use. ABD: S, NT, ND, +BS. No rebound. No HSM. EXTR: No c/c/e NEURO Normal gait.  PSYCH: Normally interactive. Conversant. Not depressed or anxious appearing.  Calm demeanor.   EKG: NSR, no ST elelvation or depression Assessment and Plan: Rash and nonspecific skin eruption - Plan: POCT CBC, Comprehensive metabolic panel, Lipid panel, POCT SEDIMENTATION RATE  Itching  Chest pain - Plan: EKG 12-Lead  H/O: HTN (hypertension) - Plan: EKG 12-Lead  Rash and itching.  At this time no acute rash is noted.  Await labs to ensure his ESR and liver function are ok.   Chest pain: his EKG is reassuring and his description of pains that move around the chest and last only for a few seconds is not typical of cardiac pain.  If this continues he is to let me know At this time his BP is ok.  Asked him to watch this at home and let me know if running over 120/80 on a consistent basis Will plan further follow- up pending labs.   Signed Abbe Amsterdam, MD

## 2013-07-27 NOTE — Patient Instructions (Signed)
Please check your blood pressure every month or so.  We would like you to run around 120/80 or so.   I will be in touch with the rest of your labs.

## 2013-07-28 ENCOUNTER — Encounter: Payer: Self-pay | Admitting: Family Medicine

## 2013-10-17 ENCOUNTER — Emergency Department (HOSPITAL_COMMUNITY)
Admission: EM | Admit: 2013-10-17 | Discharge: 2013-10-17 | Disposition: A | Discharge status: Home / Self Care | Payer: 59 | Attending: Emergency Medicine | Admitting: Emergency Medicine

## 2013-10-17 ENCOUNTER — Encounter (HOSPITAL_COMMUNITY): Payer: Self-pay | Admitting: Emergency Medicine

## 2013-10-17 DIAGNOSIS — Z862 Personal history of diseases of the blood and blood-forming organs and certain disorders involving the immune mechanism: Secondary | ICD-10-CM | POA: Insufficient documentation

## 2013-10-17 DIAGNOSIS — R519 Headache, unspecified: Secondary | ICD-10-CM

## 2013-10-17 DIAGNOSIS — R5383 Other fatigue: Secondary | ICD-10-CM

## 2013-10-17 DIAGNOSIS — M791 Myalgia, unspecified site: Secondary | ICD-10-CM

## 2013-10-17 DIAGNOSIS — Z87891 Personal history of nicotine dependence: Secondary | ICD-10-CM | POA: Insufficient documentation

## 2013-10-17 DIAGNOSIS — Z8719 Personal history of other diseases of the digestive system: Secondary | ICD-10-CM | POA: Insufficient documentation

## 2013-10-17 DIAGNOSIS — IMO0001 Reserved for inherently not codable concepts without codable children: Secondary | ICD-10-CM | POA: Insufficient documentation

## 2013-10-17 DIAGNOSIS — M129 Arthropathy, unspecified: Secondary | ICD-10-CM | POA: Insufficient documentation

## 2013-10-17 DIAGNOSIS — R52 Pain, unspecified: Secondary | ICD-10-CM | POA: Insufficient documentation

## 2013-10-17 DIAGNOSIS — R51 Headache: Secondary | ICD-10-CM | POA: Insufficient documentation

## 2013-10-17 DIAGNOSIS — Z8639 Personal history of other endocrine, nutritional and metabolic disease: Secondary | ICD-10-CM | POA: Insufficient documentation

## 2013-10-17 DIAGNOSIS — R5381 Other malaise: Secondary | ICD-10-CM | POA: Insufficient documentation

## 2013-10-17 DIAGNOSIS — I10 Essential (primary) hypertension: Secondary | ICD-10-CM | POA: Insufficient documentation

## 2013-10-17 DIAGNOSIS — R000 Tachycardia, unspecified: Secondary | ICD-10-CM | POA: Insufficient documentation

## 2013-10-17 DIAGNOSIS — Z880 Allergy status to penicillin: Secondary | ICD-10-CM | POA: Insufficient documentation

## 2013-10-17 LAB — CBC WITH DIFFERENTIAL/PLATELET
Basophils Absolute: 0 10*3/uL (ref 0.0–0.1)
Basophils Relative: 0 % (ref 0–1)
EOS ABS: 0 10*3/uL (ref 0.0–0.7)
Eosinophils Relative: 0 % (ref 0–5)
HCT: 45.2 % (ref 39.0–52.0)
HEMOGLOBIN: 15.8 g/dL (ref 13.0–17.0)
LYMPHS ABS: 1.1 10*3/uL (ref 0.7–4.0)
LYMPHS PCT: 15 % (ref 12–46)
MCH: 31.1 pg (ref 26.0–34.0)
MCHC: 35 g/dL (ref 30.0–36.0)
MCV: 89 fL (ref 78.0–100.0)
MONO ABS: 0.7 10*3/uL (ref 0.1–1.0)
MONOS PCT: 9 % (ref 3–12)
Neutro Abs: 5.9 10*3/uL (ref 1.7–7.7)
Neutrophils Relative %: 76 % (ref 43–77)
Platelets: 244 10*3/uL (ref 150–400)
RBC: 5.08 MIL/uL (ref 4.22–5.81)
RDW: 12.8 % (ref 11.5–15.5)
WBC: 7.7 10*3/uL (ref 4.0–10.5)

## 2013-10-17 LAB — BASIC METABOLIC PANEL
BUN: 11 mg/dL (ref 6–23)
CO2: 25 meq/L (ref 19–32)
Calcium: 8.8 mg/dL (ref 8.4–10.5)
Chloride: 98 mEq/L (ref 96–112)
Creatinine, Ser: 1.1 mg/dL (ref 0.50–1.35)
GFR calc Af Amer: 85 mL/min — ABNORMAL LOW (ref >90)
GFR calc non Af Amer: 73 mL/min — ABNORMAL LOW (ref >90)
GLUCOSE: 103 mg/dL — AB (ref 70–99)
Potassium: 3.9 mEq/L (ref 3.7–5.3)
SODIUM: 136 meq/L — AB (ref 137–147)

## 2013-10-17 MED ORDER — IBUPROFEN 800 MG PO TABS
800.0000 mg | ORAL_TABLET | Freq: Once | ORAL | Status: AC
Start: 2013-10-17 — End: 2013-10-17
  Administered 2013-10-17: 800 mg via ORAL
  Filled 2013-10-17: qty 1

## 2013-10-17 MED ORDER — SODIUM CHLORIDE 0.9 % IV BOLUS (SEPSIS)
1000.0000 mL | Freq: Once | INTRAVENOUS | Status: AC
  Administered 2013-10-17: 1000 mL via INTRAVENOUS

## 2013-10-17 NOTE — ED Provider Notes (Signed)
CSN: [id number]     Arrival date & time 10/17/13  4098 History   First MD Initiated Contact with Patient 10/17/13 [phone]     Chief Complaint  Patient presents with  . Generalized Body Aches  . Headache   (Consider location/radiation/quality/duration/timing/severity/associated sxs/prior Treatment) Patient is a 58 y.o. male presenting with general illness.  Illness Quality:  Fatigue, myalgias, headache Severity:  Severe Onset quality:  Gradual Duration:  2 days Timing:  Constant Progression:  Unchanged Chronicity:  New Relieved by:  Nothing Ineffective treatments:  BC powder Associated symptoms: headaches and myalgias   Associated symptoms: no abdominal pain, no chest pain, no congestion, no cough, no diarrhea, no fever, no nausea, no shortness of breath and no vomiting     Past Medical History  Diagnosis Date  . GERD (gastroesophageal reflux disease)   . Arthritis   . Hyperlipidemia   . Hypertension     LOV,   Dr Azucena Cecil 02/29/12 on chart-   CBC with diff, CMET 02/29/12 on chart  . Dysrhythmia     tachycardia- EKG,clearance 02/29/12 Dr Azucena Cecil  on chart  . Sleep apnea     STOP BANG SCORE 4   Past Surgical History  Procedure Laterality Date  . No past surgeries    . Partial knee arthroplasty  03/10/2012    Procedure: UNICOMPARTMENTAL KNEE;  Surgeon: [name], MD;  Location: [facility];  Service: Orthopedics;  Laterality: Left;   No family history on file. History  Substance Use Topics  . Smoking status: Former Smoker -- 0.50 packs/day for 20 years    Types: Cigarettes    Quit date: 03/05/1999  . Smokeless tobacco: Never Used  . Alcohol Use: Yes     Comment: none x 20 yrs    Review of Systems  Constitutional: Negative for fever.  HENT: Negative for congestion.   Respiratory: Negative for cough and shortness of breath.   Cardiovascular: Negative for chest pain.  Gastrointestinal: Negative for nausea, vomiting, abdominal pain and diarrhea.  Musculoskeletal: Positive for  myalgias.  Neurological: Positive for headaches.  All other systems reviewed and are negative.    Allergies  Penicillins  Home Medications   Current Outpatient Rx  Name  Route  Sig  Dispense  Refill  . acetaminophen (TYLENOL) 650 MG CR tablet   Oral   Take 650 mg by mouth every 8 (eight) hours as needed for pain.         . Aspirin-Salicylamide-Caffeine (BC FAST PAIN RELIEF) 650-195-33.3 MG PACK   Oral   Take 1 packet by mouth every 8 (eight) hours as needed (pain).         Marland Kitchen ibuprofen (ADVIL,MOTRIN) 200 MG tablet   Oral   Take 200 mg by mouth every 6 (six) hours as needed for mild pain or moderate pain.         . Multiple Vitamin (MULTIVITAMIN WITH MINERALS) TABS tablet   Oral   Take 1 tablet by mouth daily. Geritol          BP 142/105  Pulse 113  Temp(Src) 98.2 F (36.8 C) (Oral)  Resp 18  SpO2 94% Physical Exam  Nursing note and vitals reviewed. Constitutional: He is oriented to person, place, and time. He appears well-developed and well-nourished. No distress.  HENT:  Head: Normocephalic and atraumatic.  Mouth/Throat: Oropharynx is clear and moist.  Eyes: Conjunctivae are normal. Pupils are equal, round, and reactive to light. No scleral icterus.  Neck: Neck supple.  Cardiovascular: Normal  rate, regular rhythm, normal heart sounds and intact distal pulses.   No murmur heard. Pulmonary/Chest: Effort normal and breath sounds normal. No stridor. No respiratory distress. He has no wheezes. He has no rales.  Abdominal: Soft. He exhibits no distension. There is no tenderness. There is no rebound and no guarding.  Musculoskeletal: Normal range of motion. He exhibits no edema.  Neurological: He is alert and oriented to person, place, and time.  Skin: Skin is warm and dry. No rash noted.  Psychiatric: He has a normal mood and affect. His behavior is normal.    ED Course  Procedures (including critical care time) Labs Review Labs Reviewed  BASIC METABOLIC  PANEL - Abnormal; Notable for the following:    Sodium 136 (*)    Glucose, Bld 103 (*)    GFR calc non Af Amer 73 (*)    GFR calc Af Amer 85 (*)    All other components within normal limits  CBC WITH DIFFERENTIAL   Imaging Review No results found.  EKG Interpretation    Date/Time:  Saturday October 17 2013 09:48:44 EST Ventricular Rate:  73 PR Interval:  162 QRS Duration: 96 QT Interval:  390 QTC Calculation: 430 R Axis:   -8 Text Interpretation:  Sinus rhythm RSR' in V1 or V2, probably normal variant Borderline T wave abnormalities No old tracing to compare Confirmed by [facility] Center LLCWOFFORD  MD, TREY ([id number]) on 10/17/2013 9:54:59 AM            MDM   1. Myalgia   2. Headache    58 yo male with 2 days of fatigue, myalgias, and headache.  Well appearing, nontoxic.  Slightly tachycardic.  Symptoms possibly secondary to viral syndrome.  No documented fevers, but reports mild chills/sweats.  Has headache, but has no meningeal signs or stiff neck to suggest meningitis.  Will check labs and give IV fluids, recheck.    Labs unremarkable.  EKG with borderline t wave changes, but story is not at all consistent with ACS.  HR improved.  Pt felt better after ibuprofen. I suspect he has a viral syndrome.  He appears stable for dc with supportive care.  Return precautions given.    Candyce ChurnJohn David Xiamara Hulet, MD 10/17/13 [phone]

## 2013-10-17 NOTE — ED Notes (Signed)
Pt c/o headache, body aches since yesterday.

## 2014-07-20 ENCOUNTER — Ambulatory Visit (INDEPENDENT_AMBULATORY_CARE_PROVIDER_SITE_OTHER): Payer: 59 | Admitting: Internal Medicine

## 2014-07-20 VITALS — BP 162/90 | HR 106 | Temp 97.7°F | Resp 18 | Ht 71.5 in | Wt 246.0 lb

## 2014-07-20 DIAGNOSIS — J028 Acute pharyngitis due to other specified organisms: Secondary | ICD-10-CM

## 2014-07-20 DIAGNOSIS — J200 Acute bronchitis due to Mycoplasma pneumoniae: Secondary | ICD-10-CM

## 2014-07-20 MED ORDER — AZITHROMYCIN 500 MG PO TABS: 500.0000 mg | ORAL_TABLET | Freq: Every day | ORAL | Status: DC

## 2014-07-20 MED ORDER — HYDROCODONE-ACETAMINOPHEN 7.5-325 MG/15ML PO SOLN: 5.0000 mL | Freq: Four times a day (QID) | ORAL | Status: DC | PRN

## 2014-07-20 NOTE — Progress Notes (Signed)
   Subjective:    Patient ID: Randall Cook, male    DOB: 1956-05-28, 58 y.o.   MRN: [id number]  HPI  Patient is here complaining of a productive cough that has persisted for a little over a week. He notes his cough is producing some green phlegm. He states he has been coughing so much that it makes him vomit. He states he has been taking Mucinex and Theraflu with some relief. He also notes some maxillary sinus pressure and pain, worse on the right side that started yesterday. He is also complaining of chest tightness and pain associated with persistent coughing. He denies history of cardiovascular disease. He states he has felt feverish and noted some chills, but has not taken his temperature at home. He has noticed some SOB especially in the morning. He denies current tobacco use, he is a former smoker.  Review of Systems     Objective:   Physical Exam  Nursing note and vitals reviewed. Constitutional: He is oriented to person, place, and time. He appears well-developed and well-nourished. No distress.  HENT:  Head: Normocephalic.  Right Ear: External ear normal.  Left Ear: External ear normal.  Nose: Mucosal edema and rhinorrhea present. No sinus tenderness.  Mouth/Throat: Uvula is midline. Uvula swelling present. Posterior oropharyngeal erythema present.  Cardiovascular: Normal rate, regular rhythm and normal heart sounds.   Pulmonary/Chest: Effort normal. Not tachypneic. No respiratory distress. He has no decreased breath sounds. He has rhonchi. He has no rales. He exhibits tenderness.  Musculoskeletal: Normal range of motion.  Neurological: He is alert and oriented to person, place, and time. He exhibits normal muscle tone. Coordination normal.  Skin: He is diaphoretic.  Psychiatric: He has a normal mood and affect. His behavior is normal. Thought content normal.   162/90 improved       Assessment & Plan:  Do not use decongestants Acute bronchitis/pharyngitis Zithromax  500mg /Lortab elixir Zithromax 500mg Leandro Reasoner/Lortab elixir

## 2014-07-20 NOTE — Patient Instructions (Signed)
Acute Bronchitis °Bronchitis is inflammation of the airways that extend from the windpipe into the lungs (bronchi). The inflammation often causes mucus to develop. This leads to a cough, which is the most common symptom of bronchitis.  °In acute bronchitis, the condition usually develops suddenly and goes away over time, usually in a couple weeks. Smoking, allergies, and asthma can make bronchitis worse. Repeated episodes of bronchitis may cause further lung problems.  °CAUSES °Acute bronchitis is most often caused by the same virus that causes a cold. The virus can spread from person to person (contagious) through coughing, sneezing, and touching contaminated objects. °SIGNS AND SYMPTOMS  °· Cough.   °· Fever.   °· Coughing up mucus.   °· Body aches.   °· Chest congestion.   °· Chills.   °· Shortness of breath.   °· Sore throat.   °DIAGNOSIS  °Acute bronchitis is usually diagnosed through a physical exam. Your health care provider will also ask you questions about your medical history. Tests, such as chest X-rays, are sometimes done to rule out other conditions.  °TREATMENT  °Acute bronchitis usually goes away in a couple weeks. Oftentimes, no medical treatment is necessary. Medicines are sometimes given for relief of fever or cough. Antibiotic medicines are usually not needed but may be prescribed in certain situations. In some cases, an inhaler may be recommended to help reduce shortness of breath and control the cough. A cool mist vaporizer may also be used to help thin bronchial secretions and make it easier to clear the chest.  °HOME CARE INSTRUCTIONS °· Get plenty of rest.   °· Drink enough fluids to keep your urine clear or pale yellow (unless you have a medical condition that requires fluid restriction). Increasing fluids may help thin your respiratory secretions (sputum) and reduce chest congestion, and it will prevent dehydration.   °· Take medicines only as directed by your health care provider. °· If  you were prescribed an antibiotic medicine, finish it all even if you start to feel better. °· Avoid smoking and secondhand smoke. Exposure to cigarette smoke or irritating chemicals will make bronchitis worse. If you are a smoker, consider using nicotine gum or skin patches to help control withdrawal symptoms. Quitting smoking will help your lungs heal faster.   °· Reduce the chances of another bout of acute bronchitis by washing your hands frequently, avoiding people with cold symptoms, and trying not to touch your hands to your mouth, nose, or eyes.   °· Keep all follow-up visits as directed by your health care provider.   °SEEK MEDICAL CARE IF: °Your symptoms do not improve after 1 week of treatment.  °SEEK IMMEDIATE MEDICAL CARE IF: °· You develop an increased fever or chills.   °· You have chest pain.   °· You have severe shortness of breath. °· You have bloody sputum.   °· You develop dehydration. °· You faint or repeatedly feel like you are going to pass out. °· You develop repeated vomiting. °· You develop a severe headache. °MAKE SURE YOU:  °· Understand these instructions. °· Will watch your condition. °· Will get help right away if you are not doing well or get worse. °Document Released: 10/18/2004 Document Revised: 01/25/2014 Document Reviewed: 03/03/2013 °ExitCare® Patient Information ©2015 [employer]. This information is not intended to replace advice given to you by your health care provider. Make sure you discuss any questions you have with your health care provider. °Pharyngitis °Pharyngitis is redness, pain, and swelling (inflammation) of your pharynx.  °CAUSES  °Pharyngitis is usually caused by infection. Most of the time,   these infections are from viruses (viral) and are part of a cold. However, sometimes pharyngitis is caused by bacteria (bacterial). Pharyngitis can also be caused by allergies. Viral pharyngitis may be spread from person to person by coughing, sneezing, and personal items or  utensils (cups, forks, spoons, toothbrushes). Bacterial pharyngitis may be spread from person to person by more intimate contact, such as kissing.  °SIGNS AND SYMPTOMS  °Symptoms of pharyngitis include:   °· Sore throat.   °· Tiredness (fatigue).   °· Low-grade fever.   °· Headache. °· Joint pain and muscle aches. °· Skin rashes. °· Swollen lymph nodes. °· Plaque-like film on throat or tonsils (often seen with bacterial pharyngitis). °DIAGNOSIS  °Your health care provider will ask you questions about your illness and your symptoms. Your medical history, along with a physical exam, is often all that is needed to diagnose pharyngitis. Sometimes, a rapid strep test is done. Other lab tests may also be done, depending on the suspected cause.  °TREATMENT  °Viral pharyngitis will usually get better in 3-4 days without the use of medicine. Bacterial pharyngitis is treated with medicines that kill germs (antibiotics).  °HOME CARE INSTRUCTIONS  °· Drink enough water and fluids to keep your urine clear or pale yellow.   °· Only take over-the-counter or prescription medicines as directed by your health care provider:   °· If you are prescribed antibiotics, make sure you finish them even if you start to feel better.   °· Do not take aspirin.   °· Get lots of rest.   °· Gargle with 8 oz of salt water (½ tsp of salt per 1 qt of water) as often as every 1-2 hours to soothe your throat.   °· Throat lozenges (if you are not at risk for choking) or sprays may be used to soothe your throat. °SEEK MEDICAL CARE IF:  °· You have large, tender lumps in your neck. °· You have a rash. °· You cough up green, yellow-brown, or bloody spit. °SEEK IMMEDIATE MEDICAL CARE IF:  °· Your neck becomes stiff. °· You drool or are unable to swallow liquids. °· You vomit or are unable to keep medicines or liquids down. °· You have severe pain that does not go away with the use of recommended medicines. °· You have trouble breathing (not caused by a stuffy  nose). °MAKE SURE YOU:  °· Understand these instructions. °· Will watch your condition. °· Will get help right away if you are not doing well or get worse. °Document Released: 09/10/2005 Document Revised: 07/01/2013 Document Reviewed: 05/18/2013 °ExitCare® Patient Information ©2015 [employer]. This information is not intended to replace advice given to you by your health care provider. Make sure you discuss any questions you have with your health care provider. ° °

## 2015-05-21 ENCOUNTER — Ambulatory Visit (INDEPENDENT_AMBULATORY_CARE_PROVIDER_SITE_OTHER): Payer: No Typology Code available for payment source | Admitting: Emergency Medicine

## 2015-05-21 VITALS — BP 144/92 | HR 96 | Temp 98.3°F | Resp 16 | Ht 71.5 in | Wt 243.8 lb

## 2015-05-21 DIAGNOSIS — K088 Other specified disorders of teeth and supporting structures: Secondary | ICD-10-CM

## 2015-05-21 DIAGNOSIS — K0889 Other specified disorders of teeth and supporting structures: Secondary | ICD-10-CM

## 2015-05-21 MED ORDER — CLINDAMYCIN HCL 300 MG PO CAPS: 300.0000 mg | ORAL_CAPSULE | Freq: Three times a day (TID) | ORAL | Status: DC

## 2015-05-21 MED ORDER — HYDROCODONE-ACETAMINOPHEN 5-325 MG PO TABS: 1.0000 | ORAL_TABLET | Freq: Four times a day (QID) | ORAL | Status: DC | PRN

## 2015-05-21 NOTE — Patient Instructions (Signed)

## 2015-05-21 NOTE — Progress Notes (Addendum)
Patient ID: Randall Cook, male   DOB: [birth year], 59 y.o.   MRN: [id number]    This chart was scribed for Earl Lites, MD by [facility], medical scribe at [facility].The patient was seen in exam room 08 and the patient's care was started at 11:37 AM.  Chief Complaint:  Chief Complaint  Patient presents with  . Dental Pain   HPI: Randall Cook is a 59 y.o. male who reports to [facility] today complaining of left upper dental pain, onset yesterday morning. Last seen by a dentist last year. No fever and chills. Using an oral gel for relief. 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  Past Medical History  Diagnosis Date  . GERD (gastroesophageal reflux disease)   . Arthritis   . Hyperlipidemia   . Hypertension     LOV,   Dr Azucena Cecil 02/29/12 on chart-   CBC with diff, [facility] 02/29/12 on chart  . Dysrhythmia     tachycardia- EKG,clearance 02/29/12 Dr Azucena Cecil  on chart  . Sleep apnea     STOP BANG SCORE 4   Past Surgical History  Procedure Laterality Date  . No past surgeries    . Partial knee arthroplasty  03/10/2012    Procedure: UNICOMPARTMENTAL KNEE;  Surgeon: [name], MD;  Location: [facility];  Service: Orthopedics;  Laterality: Left;   Social History   Social History  . Marital Status: Married    Spouse Name: N/A  . Number of Children: N/A  . Years of Education: N/A   Social History Main Topics  . Smoking status: Former Smoker -- 0.50 packs/day for 20 years    Types: Cigarettes    Quit date: 03/05/1999  . Smokeless tobacco: Never Used  . Alcohol Use: Yes     Comment: none x 20 yrs  . Drug Use: No  . Sexual Activity: Not Asked   Other Topics Concern  . None   Social History Narrative   History reviewed. No pertinent family history. Allergies  Allergen Reactions  . Penicillins Rash   Prior to Admission medications   Medication Sig Start Date End Date Taking? Authorizing Provider  Multiple Vitamin (MULTIVITAMIN WITH MINERALS) TABS tablet Take 1 tablet by  mouth daily. Geritol   Yes Historical Provider, MD    ROS: The patient denies fevers, chills, night sweats, unintentional weight loss, chest pain, palpitations, wheezing, dyspnea on exertion, nausea, vomiting, abdominal pain, dysuria, hematuria, melena, numbness, weakness, or tingling.  All other systems have been reviewed and were otherwise negative with the exception of those mentioned in the HPI and as above.    PHYSICAL EXAM: Filed Vitals:   05/21/15 1113  BP: 144/92  Pulse: 96  Temp: 98.3 F (36.8 C)  Resp: 16   Body mass index is 33.53 kg/(m^2).  General: Alert, no acute distress HEENT:  Normocephalic, atraumatic, oropharynx patent.  Removal of his upper incisors. Has significant gingival disease involving the canines and premolars. Worse on the left. Eye: [facility] Cardiovascular:  Regular rate and rhythm, no rubs murmurs or gallops.  No Carotid bruits, radial pulse intact. No pedal edema.  Respiratory: Clear to auscultation bilaterally.  No wheezes, rales, or rhonchi.  No cyanosis, no use of accessory musculature Abdominal: No organomegaly, abdomen is soft and non-tender, positive bowel sounds.  No masses. Musculoskeletal: Gait intact. No edema, tenderness Skin: No rashes. Neurologic: Facial musculature symmetric. Psychiatric: Patient acts appropriately throughout our interaction. Lymphatic: No cervical or submandibular lymphadenopathy Meds ordered  this encounter  Medications  . clindamycin (CLEOCIN) 300 MG capsule    Sig: Take 1 capsule (300 mg total) by mouth 3 (three) times daily.    Dispense:  30 capsule    Refill:  0  . HYDROcodone-acetaminophen (NORCO) 5-325 MG per tablet    Sig: Take 1 tablet by mouth every 6 (six) hours as needed for moderate pain.    Dispense:  16 tablet    Refill:  0   LABS:  EKG/XRAY:   Primary read interpreted by Dr. Cleta Alberts at [facility].  ASSESSMENT/PLAN: We will proceed with clindamycin and hydrocodone for pain. I strongly advised him to  make an appointment to be seen in the dental clinic as soon as possible.I personally performed the services described in this documentation, which was scribed in my presence. The recorded information has been reviewed and is accurate.  Gross sideeffects, risk and benefits, and alternatives of medications d/w patient. Patient is aware that all medications have potential sideeffects and we are unable to predict every sideeffect or drug-drug interaction that may occur.    Lesle Chris MD 05/21/2015 11:37 AM

## 2016-01-26 ENCOUNTER — Emergency Department (HOSPITAL_COMMUNITY)
Admission: EM | Admit: 2016-01-26 | Discharge: 2016-01-26 | Disposition: A | Discharge status: Home / Self Care | Payer: BLUE CROSS/BLUE SHIELD | Attending: Emergency Medicine | Admitting: Emergency Medicine

## 2016-01-26 ENCOUNTER — Emergency Department (HOSPITAL_COMMUNITY): Payer: BLUE CROSS/BLUE SHIELD

## 2016-01-26 ENCOUNTER — Encounter (HOSPITAL_COMMUNITY): Payer: Self-pay | Admitting: Emergency Medicine

## 2016-01-26 DIAGNOSIS — M1990 Unspecified osteoarthritis, unspecified site: Secondary | ICD-10-CM | POA: Diagnosis not present

## 2016-01-26 DIAGNOSIS — Z87891 Personal history of nicotine dependence: Secondary | ICD-10-CM | POA: Diagnosis not present

## 2016-01-26 DIAGNOSIS — I10 Essential (primary) hypertension: Secondary | ICD-10-CM | POA: Insufficient documentation

## 2016-01-26 DIAGNOSIS — K219 Gastro-esophageal reflux disease without esophagitis: Secondary | ICD-10-CM | POA: Diagnosis not present

## 2016-01-26 DIAGNOSIS — E785 Hyperlipidemia, unspecified: Secondary | ICD-10-CM | POA: Diagnosis not present

## 2016-01-26 DIAGNOSIS — Z79891 Long term (current) use of opiate analgesic: Secondary | ICD-10-CM | POA: Insufficient documentation

## 2016-01-26 DIAGNOSIS — R51 Headache: Secondary | ICD-10-CM | POA: Insufficient documentation

## 2016-01-26 DIAGNOSIS — Z792 Long term (current) use of antibiotics: Secondary | ICD-10-CM | POA: Insufficient documentation

## 2016-01-26 DIAGNOSIS — R519 Headache, unspecified: Secondary | ICD-10-CM

## 2016-01-26 LAB — BASIC METABOLIC PANEL
Anion gap: 8 (ref 5–15)
BUN: 19 mg/dL (ref 6–20)
CO2: 24 mmol/L (ref 22–32)
Calcium: 8.9 mg/dL (ref 8.9–10.3)
Chloride: 107 mmol/L (ref 101–111)
Creatinine, Ser: 1.12 mg/dL (ref 0.61–1.24)
GFR calc Af Amer: >60 mL/min (ref >60)
GFR calc non Af Amer: >60 mL/min (ref >60)
Glucose, Bld: 107 mg/dL — ABNORMAL HIGH (ref 65–99)
Potassium: 4 mmol/L (ref 3.5–5.1)
SODIUM: 139 mmol/L (ref 135–145)

## 2016-01-26 LAB — CBC
HCT: 44.3 % (ref 39.0–52.0)
Hemoglobin: 15.1 g/dL (ref 13.0–17.0)
MCH: 31.2 pg (ref 26.0–34.0)
MCHC: 34.1 g/dL (ref 30.0–36.0)
MCV: 91.5 fL (ref 78.0–100.0)
Platelets: 239 10*3/uL (ref 150–400)
RBC: 4.84 MIL/uL (ref 4.22–5.81)
RDW: 13.8 % (ref 11.5–15.5)
WBC: 7.8 10*3/uL (ref 4.0–10.5)

## 2016-01-26 MED ORDER — METOCLOPRAMIDE HCL 5 MG/ML IJ SOLN
10.0000 mg | Freq: Once | INTRAMUSCULAR | Status: AC
  Administered 2016-01-26: 10 mg via INTRAVENOUS
  Filled 2016-01-26: qty 2

## 2016-01-26 MED ORDER — DIPHENHYDRAMINE HCL 50 MG/ML IJ SOLN
12.5000 mg | Freq: Once | INTRAMUSCULAR | Status: AC
  Administered 2016-01-26: 12.5 mg via INTRAVENOUS
  Filled 2016-01-26: qty 1

## 2016-01-26 MED ORDER — SODIUM CHLORIDE 0.9 % IV BOLUS (SEPSIS)
1000.0000 mL | Freq: Once | INTRAVENOUS | Status: AC
  Administered 2016-01-26: 1000 mL via INTRAVENOUS

## 2016-01-26 NOTE — ED Provider Notes (Signed)
CSN: [id number]     Arrival date & time 01/26/16  [id number] History   First MD Initiated Contact with Patient 01/26/16 0636     Chief Complaint  Patient presents with  . Headache   (Consider location/radiation/quality/duration/timing/severity/associated sxs/prior Treatment) HPI  60 y.o. male with a hx of HTN, presents to the Emergency Department today complaining of headache that woke him from sleep this morning around 0400. Pt states it is a frontal headache that is 9/10 and throbbing. States initially sharp. Notes past headaches that are similar. Has photophobia. No N/V/D. No vision changes/loss of vision. No fevers. No CP/SOB/ABD pain. No hx strokes. No other symptoms noted.   Past Medical History  Diagnosis Date  . GERD (gastroesophageal reflux disease)   . Arthritis   . Hyperlipidemia   . Hypertension     LOV,   Dr Azucena CecilSwayne 02/29/12 on chart-   CBC with diff, [facility] 02/29/12 on chart  . Dysrhythmia     tachycardia- EKG,clearance 02/29/12 Dr Azucena CecilSwayne  on chart  . Sleep apnea     STOP BANG SCORE 4   Past Surgical History  Procedure Laterality Date  . No past surgeries    . Partial knee arthroplasty  03/10/2012    Procedure: UNICOMPARTMENTAL KNEE;  Surgeon: Shelda PalMatthew D Olin, MD;  Location: [facility];  Service: Orthopedics;  Laterality: Left;   No family history on file. Social History  Substance Use Topics  . Smoking status: Former Smoker -- 0.50 packs/day for 20 years    Types: Cigarettes    Quit date: 03/05/1999  . Smokeless tobacco: Never Used  . Alcohol Use: Yes     Comment: none x 20 yrs    Review of Systems ROS reviewed and all are negative for acute change except as noted in the HPI.  Allergies  Penicillins  Home Medications   Prior to Admission medications   Medication Sig Start Date End Date Taking? Authorizing Provider  clindamycin (CLEOCIN) 300 MG capsule Take 1 capsule (300 mg total) by mouth 3 (three) times daily. 05/21/15   Collene GobbleSteven A Daub, MD  HYDROcodone-acetaminophen  (NORCO) 5-325 MG per tablet Take 1 tablet by mouth every 6 (six) hours as needed for moderate pain. 05/21/15   Collene GobbleSteven A Daub, MD  Multiple Vitamin (MULTIVITAMIN WITH MINERALS) TABS tablet Take 1 tablet by mouth daily. Geritol    Historical Provider, MD   BP 139/99 mmHg  Pulse 64  Temp(Src) 97.6 F (36.4 C) (Oral)  Resp 16  Ht 6\' 1"  (1.854 m)  Wt 108.863 kg  BMI 31.67 kg/m2  SpO2 95%   Physical Exam  Constitutional: He is oriented to person, place, and time. He appears well-developed and well-nourished.  HENT:  Head: Normocephalic and atraumatic.  Eyes: EOM are normal. Pupils are equal, round, and reactive to light.  Neck: Normal range of motion. Neck supple. No tracheal deviation present.  Cardiovascular: Normal rate, regular rhythm, normal heart sounds and intact distal pulses.   No murmur heard. Pulmonary/Chest: Effort normal and breath sounds normal. No respiratory distress. He has no wheezes. He has no rales. He exhibits no tenderness.  Abdominal: Soft. There is no tenderness.  Musculoskeletal: Normal range of motion.  Neurological: He is alert and oriented to person, place, and time. He has normal strength. No cranial nerve deficit or sensory deficit.  Neg Pronator Drift. Pt able to ambulate. Cranial Nerves:  II: Pupils equal, round, reactive to light III,IV, VI: ptosis not present, extra-ocular motions intact bilaterally  V,VII: smile  symmetric, facial light touch sensation equal VIII: hearing grossly normal bilaterally  IX,X: midline uvula rise  XI: bilateral shoulder shrug equal and strong XII: midline tongue extension  Skin: Skin is warm and dry.  Psychiatric: He has a normal mood and affect. His behavior is normal. Thought content normal.  Nursing note and vitals reviewed.  ED Course  Procedures (including critical care time) Labs Review Labs Reviewed  BASIC METABOLIC PANEL - Abnormal; Notable for the following:    Glucose, Bld 107 (*)    All other components  within normal limits  CBC   Imaging Review No results found. I have personally reviewed and evaluated these images and lab results as part of my medical decision-making.   EKG Interpretation None      MDM  I have reviewed and evaluated the relevant laboratory values I have reviewed and evaluated the relevant imaging studies.  I have reviewed the relevant previous healthcare records. I obtained HPI from historian. Patient discussed with supervising physician  ED Course:  Assessment: Patient is a 60yM that presents with headache since 0400. States woke from sleep, but endorses similar headaches in past Patient is without high-risk features of headache including: No similar headache in past, Altered mental status, Accompanying seizure, Headache with exertion, History of immunocompromise, Neck or shoulder pain, Fever, Use of anticoagulation, Family history of spontaneous SAH, Concomitant drug use, Toxic exposure.  Patient has a normal complete neurological exam, normal vital signs, normal level of consciousness, no signs of meningismus, is well-appearing/non-toxic appearing, no signs of trauma. No papilledema, no pain over the temporal arteries.CT Head showed no acute abnormalities. No dangerous or life-threatening conditions suspected or identified by history, physical exam, and by work-up. No indications for hospitalization identified. Pt responded well to Benadryl, Reglan, Fluids in ED with symptoms resolving. Plan is to DC Home with follow up to PCP. At time of discharge, Patient is in no acute distress. Vital Signs are stable. Patient is able to ambulate. Patient able to tolerate PO.    Disposition/Plan:  DC Home Additional Verbal discharge instructions given and discussed with patient.  Pt Instructed to f/u with PCP in the next week for evaluation and treatment of symptoms. Return precautions given Pt acknowledges and agrees with plan  Supervising Physician Laurence Spates,  MD   Final diagnoses:  Acute nonintractable headache, unspecified headache type      Audry Pili, PA-C 01/26/16 1610  Laurence Spates, MD 01/29/16 [phone]

## 2016-01-26 NOTE — Discharge Instructions (Signed)
Please read and follow all provided instructions.  Your diagnoses today include:  1. Acute nonintractable headache, unspecified headache type    Tests performed today include:  CT of your head which was normal and did not show any serious cause of your headache  Vital signs. See below for your results today.   Medications:  In the Emergency Department you received:  Reglan - antinausea/headache medication  Benadryl - antihistamine to counteract potential side effects of reglan  Take any prescribed medications only as directed.  Additional information:  Follow any educational materials contained in this packet.  You are having a headache. No specific cause was found today for your headache. It may have been a migraine or other cause of headache. Stress, anxiety, fatigue, and depression are common triggers for headaches.   Your headache today does not appear to be life-threatening or require hospitalization, but often the exact cause of headaches is not determined in the emergency department. Therefore, follow-up with your doctor is very important to find out what may have caused your headache and whether or not you need any further diagnostic testing or treatment.   Sometimes headaches can appear benign (not harmful), but then more serious symptoms can develop which should prompt an immediate re-evaluation by your doctor or the emergency department.  BE VERY CAREFUL not to take multiple medicines containing Tylenol (also called acetaminophen). Doing so can lead to an overdose which can damage your liver and cause liver failure and possibly death.   Follow-up instructions: Please follow-up with your primary care provider in the next 3 days for further evaluation of your symptoms.   Return instructions:   Please return to the Emergency Department if you experience worsening symptoms.  Return if the medications do not resolve your headache, if it recurs, or if you have multiple  episodes of vomiting or cannot keep down fluids.  Return if you have a change from the usual headache.  RETURN IMMEDIATELY IF you:  Develop a sudden, severe headache  Develop confusion or become poorly responsive or faint  Develop a fever above 100.67F or problem breathing  Have a change in speech, vision, swallowing, or understanding  Develop new weakness, numbness, tingling, incoordination in your arms or legs  Have a seizure  Please return if you have any other emergent concerns.  Additional Information:  Your vital signs today were: BP 139/99 mmHg   Pulse 64   Temp(Src) 97.6 F (36.4 C) (Oral)   Resp 16   Ht 6\' 1"  (1.854 m)   Wt 108.863 kg   BMI 31.67 kg/m2   SpO2 95% If your blood pressure (BP) was elevated above 135/85 this visit, please have this repeated by your doctor within one month. --------------

## 2016-01-26 NOTE — ED Notes (Signed)
Pa  at bedside. 

## 2016-01-26 NOTE — ED Notes (Signed)
Pt c/o HA onset 0400, pt states pain woke him from sleep. Denies n/v/d, unable to state if he has dizziness or blurred vision.

## 2016-04-30 ENCOUNTER — Emergency Department (HOSPITAL_COMMUNITY)
Admission: EM | Admit: 2016-04-30 | Discharge: 2016-04-30 | Disposition: A | Discharge status: Home / Self Care | Payer: Self-pay | Attending: Emergency Medicine | Admitting: Emergency Medicine

## 2016-04-30 ENCOUNTER — Encounter (HOSPITAL_COMMUNITY): Payer: Self-pay | Admitting: Oncology

## 2016-04-30 ENCOUNTER — Emergency Department (HOSPITAL_COMMUNITY): Payer: Self-pay

## 2016-04-30 DIAGNOSIS — IMO0001 Reserved for inherently not codable concepts without codable children: Secondary | ICD-10-CM

## 2016-04-30 DIAGNOSIS — Y929 Unspecified place or not applicable: Secondary | ICD-10-CM | POA: Insufficient documentation

## 2016-04-30 DIAGNOSIS — Z79899 Other long term (current) drug therapy: Secondary | ICD-10-CM | POA: Insufficient documentation

## 2016-04-30 DIAGNOSIS — W228XXA Striking against or struck by other objects, initial encounter: Secondary | ICD-10-CM | POA: Insufficient documentation

## 2016-04-30 DIAGNOSIS — Z87891 Personal history of nicotine dependence: Secondary | ICD-10-CM | POA: Insufficient documentation

## 2016-04-30 DIAGNOSIS — M7022 Olecranon bursitis, left elbow: Secondary | ICD-10-CM | POA: Insufficient documentation

## 2016-04-30 DIAGNOSIS — Y939 Activity, unspecified: Secondary | ICD-10-CM | POA: Insufficient documentation

## 2016-04-30 DIAGNOSIS — Z7982 Long term (current) use of aspirin: Secondary | ICD-10-CM | POA: Insufficient documentation

## 2016-04-30 DIAGNOSIS — Y999 Unspecified external cause status: Secondary | ICD-10-CM | POA: Insufficient documentation

## 2016-04-30 DIAGNOSIS — R030 Elevated blood-pressure reading, without diagnosis of hypertension: Secondary | ICD-10-CM

## 2016-04-30 DIAGNOSIS — I10 Essential (primary) hypertension: Secondary | ICD-10-CM | POA: Insufficient documentation

## 2016-04-30 MED ORDER — NAPROXEN 500 MG PO TABS: 500.0000 mg | ORAL_TABLET | Freq: Two times a day (BID) | ORAL | 0 refills | Status: DC

## 2016-04-30 NOTE — ED Notes (Signed)
Patient d/c'd self care.  F/U and medication reviewed.  Educated patient on the importance of following up with his PCP for BP recheck.  Patient verbalized understanding.

## 2016-04-30 NOTE — Discharge Instructions (Signed)
It is very important that you follow up with your primary physician for a blood pressure recheck in 1 week.  Take medication twice daily until symptoms resolve. Ice as instructed below - this is very important!  If symptoms do not improve in 2 weeks, follow up with the orthopedic clinic listed.  Return to ER if area becomes red, you develop fever, new or worsening symptoms, any additional concerns.   COLD THERAPY DIRECTIONS:  Ice or gel packs can be used to reduce both pain and swelling. Ice is the most helpful within the first 24 to 48 hours after an injury or flareup from overusing a muscle or joint.  Ice is effective, has very few side effects, and is safe for most people to use.   If you expose your skin to cold temperatures for too long or without the proper protection, you can damage your skin or nerves. Watch for signs of skin damage due to cold.   HOME CARE INSTRUCTIONS  Follow these tips to use ice and cold packs safely.  Place a dry or damp towel between the ice and skin. A damp towel will cool the skin more quickly, so you may need to shorten the time that the ice is used.  For a more rapid response, add gentle compression to the ice.  Ice for no more than 10 to 20 minutes at a time. The bonier the area you are icing, the less time it will take to get the benefits of ice.  Check your skin after 5 minutes to make sure there are no signs of a poor response to cold or skin damage.  Rest 20 minutes or more in between uses.  Once your skin is numb, you can end your treatment. You can test numbness by very lightly touching your skin. The touch should be so light that you do not see the skin dimple from the pressure of your fingertip. When using ice, most people will feel these normal sensations in this order: cold, burning, aching, and numbness.

## 2016-04-30 NOTE — ED Notes (Signed)
Patient left elbow is swollen and red.  Patient is able to bend the arm at the joint and pulses are palpable.

## 2016-04-30 NOTE — ED Triage Notes (Signed)
Pt reports hitting his left elbow approximately 1 month ago.  Pt reports that left elbow began to swell immediately after hitting it.  Pt presents tonight to have elbow evaluated.  Left elbow is significantly swollen.  Pt has full ROM w/ left elbow.  Rates pain 8/10.

## 2016-04-30 NOTE — ED Provider Notes (Signed)
[facility]-EMERGENCY DEPT Provider Note   CSN: [id number] Arrival date & time: 04/30/16  [id number]  First Provider Contact:  First MD Initiated Contact with Patient 04/30/16 [phone]        History   Chief Complaint Chief Complaint  Patient presents with  . Elbow Injury    HPI Randall Cook is a 60 y.o. male.  The history is provided by the patient and medical records. No language interpreter was used.   Randall Cook is a 60 y.o. male  with a PMH of HTN, HLD, arthritis who presents to the Emergency Department complaining of persistent left elbow swelling 1 month. Patient states initially he hit his elbow, then 2-3 days later his elbow began swelling. Swelling is unchanged over the last month. He has tried no medications or treatments prior to arrival for symptoms. Associated symptoms include elbow pain. No history of similar events. Denies fever, chills, warmth and area, redness, open wounds.   Past Medical History:  Diagnosis Date  . Arthritis   . Dysrhythmia    tachycardia- EKG,clearance 02/29/12 Dr Azucena CecilSwayne  on chart  . GERD (gastroesophageal reflux disease)   . Hyperlipidemia   . Hypertension    LOV,   Dr Azucena CecilSwayne 02/29/12 on chart-   CBC with diff, CMET 02/29/12 on chart  . Sleep apnea    STOP BANG SCORE 4    Patient Active Problem List   Diagnosis Date Noted  . 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 NO PAST SURGERIES    . PARTIAL KNEE ARTHROPLASTY  03/10/2012   Procedure: UNICOMPARTMENTAL KNEE;  Surgeon: Shelda PalMatthew D Olin, MD;  Location: [facility];  Service: Orthopedics;  Laterality: Left;       Home Medications    Prior to Admission medications   Medication Sig Start Date End Date Taking? Authorizing Provider  acetaminophen (TYLENOL) 500 MG tablet Take 500 mg by mouth daily as needed for mild pain, moderate pain, fever or headache.    Historical Provider, MD  Aspirin-Salicylamide-Caffeine (BC FAST PAIN RELIEF) 650-195-33.3 MG PACK Take 1  packet by mouth daily as needed (for pain).    Historical Provider, MD  naproxen (NAPROSYN) 500 MG tablet Take 1 tablet (500 mg total) by mouth 2 (two) times daily. 04/30/16   Chase PicketJaime Pilcher Liston Thum, PA-C  naproxen sodium (ANAPROX) 220 MG tablet Take 440 mg by mouth daily as needed (for pain).    Historical Provider, MD    Family History No family history on file.  Social History Social History  Substance Use Topics  . Smoking status: Former Smoker    Packs/day: 0.50    Years: 20.00    Types: Cigarettes    Quit date: 03/05/1999  . Smokeless tobacco: Never Used  . Alcohol use Yes     Comment: none x 20 yrs     Allergies   Penicillins   Review of Systems Review of Systems  Constitutional: Negative for chills and fever.  HENT: Negative for congestion.   Eyes: Negative for visual disturbance.  Respiratory: Negative for cough and shortness of breath.   Cardiovascular: Negative.   Gastrointestinal: Negative for abdominal pain.  Genitourinary: Negative for dysuria.  Musculoskeletal: Positive for arthralgias and joint swelling.  Skin: Negative for color change.  Neurological: Negative for headaches.     Physical Exam Updated Vital Signs BP (!) 132/108 (BP Location: Right Arm)   Pulse 62   Temp 98.7 F (37.1 C) (Oral)  Resp 17   Ht  (1.854 m)   Wt 108.9 kg   SpO2 98%   BMI 31.66 kg/m   Physical Exam  Constitutional: He is oriented to person, place, and time. He appears well-developed and well-nourished. No distress.  HENT:  Head: Normocephalic and atraumatic.  Cardiovascular: Normal rate, regular rhythm and normal heart sounds.   Left upper extremity with 2+ radial pulse.  Pulmonary/Chest: Effort normal and breath sounds normal. No respiratory distress. He has no wheezes. He has no rales. He exhibits no tenderness.  Abdominal: Soft. Bowel sounds are normal. He exhibits no distension. There is no tenderness.  Musculoskeletal:  Left elbow with full range of motion.  Swelling of olecranon area-tenderness directly over the bursa. No overlying erythema or warmth. No open wounds or ecchymosis. Sensation intact in all compartments soft.  Neurological: He is alert and oriented to person, place, and time.  Skin: Skin is warm and dry.  Nursing note and vitals reviewed.    ED Treatments / Results  Labs (all labs ordered are listed, but only abnormal results are displayed) Labs Reviewed - No data to display  EKG  EKG Interpretation None       Radiology Dg Elbow Complete Left  Result Date: 04/30/2016 CLINICAL DATA:  Olecranon pain and swelling after hitting funny bone 1 month ago. EXAM: LEFT ELBOW - COMPLETE 3+ VIEW COMPARISON:  None. FINDINGS: No acute fracture deformity or dislocation. Olecranon at enthesopathy at triceps insertion. Overlying soft tissue swelling without subcutaneous gas radiopaque foreign bodies. No destructive bony lesions. IMPRESSION: Olecranon soft tissue swelling compatible with bursitis which could be posttraumatic or possibly secondary to calcific tendinopathy. No acute fracture deformity nor dislocation. Electronically Signed   By: Awilda Metro M.D.   On: 04/30/2016 05:57    Procedures Procedures (including critical care time)  Medications Ordered in ED Medications - No data to display   Initial Impression / Assessment and Plan / ED Course  I have reviewed the triage vital signs and the nursing notes.  Pertinent labs & imaging results that were available during my care of the patient were reviewed by me and considered in my medical decision making (see chart for details).  Clinical Course   Randall Cook is a 60 y.o. male who presents to ED for left elbow swelling x 1 month after he hit elbow. On exam, left upper extremity is neurovascularly intact. There is localized swelling and tenderness over the olecranon. No sign of infection. Exam consistent with olecranon bursitis. Rx for naproxen given. Symptomatic home care  also discussed with patient at bedside. Ortho follow-up with no improvement in 2 weeks. Of note, patient's blood pressure was elevated today. Patient has history of hypertension, however stated that he no longer takes this medication. Highly encouraged patient to follow-up with PCP for BP recheck within one week. Reasons to return to ED discussed and all questions answered.  Final Clinical Impressions(s) / ED Diagnoses   Final diagnoses:  Olecranon bursitis of left elbow  Elevated blood pressure    New Prescriptions New Prescriptions   NAPROXEN (NAPROSYN) 500 MG TABLET    Take 1 tablet (500 mg total) by mouth 2 (two) times daily.     [facility] Ytuarte Litaker, PA-C 04/30/16 1610    Tomasita Crumble, MD 04/30/16 [phone]

## 2016-06-02 ENCOUNTER — Emergency Department (HOSPITAL_COMMUNITY)
Admission: EM | Admit: 2016-06-02 | Discharge: 2016-06-02 | Disposition: A | Discharge status: Home / Self Care | Payer: BLUE CROSS/BLUE SHIELD | Attending: Emergency Medicine | Admitting: Emergency Medicine

## 2016-06-02 ENCOUNTER — Encounter (HOSPITAL_COMMUNITY): Payer: Self-pay | Admitting: Oncology

## 2016-06-02 DIAGNOSIS — G43009 Migraine without aura, not intractable, without status migrainosus: Secondary | ICD-10-CM | POA: Insufficient documentation

## 2016-06-02 DIAGNOSIS — Z7982 Long term (current) use of aspirin: Secondary | ICD-10-CM | POA: Insufficient documentation

## 2016-06-02 DIAGNOSIS — Z87891 Personal history of nicotine dependence: Secondary | ICD-10-CM | POA: Insufficient documentation

## 2016-06-02 DIAGNOSIS — I10 Essential (primary) hypertension: Secondary | ICD-10-CM | POA: Insufficient documentation

## 2016-06-02 MED ORDER — SODIUM CHLORIDE 0.9 % IV BOLUS (SEPSIS)
500.0000 mL | Freq: Once | INTRAVENOUS | Status: AC
  Administered 2016-06-02: 500 mL via INTRAVENOUS

## 2016-06-02 MED ORDER — KETOROLAC TROMETHAMINE 30 MG/ML IJ SOLN
30.0000 mg | Freq: Once | INTRAMUSCULAR | Status: AC
  Administered 2016-06-02: 30 mg via INTRAVENOUS
  Filled 2016-06-02: qty 1

## 2016-06-02 MED ORDER — DIPHENHYDRAMINE HCL 50 MG/ML IJ SOLN
25.0000 mg | Freq: Once | INTRAMUSCULAR | Status: AC
  Administered 2016-06-02: 25 mg via INTRAVENOUS
  Filled 2016-06-02: qty 1

## 2016-06-02 MED ORDER — PROCHLORPERAZINE EDISYLATE 5 MG/ML IJ SOLN
10.0000 mg | Freq: Once | INTRAMUSCULAR | Status: AC
  Administered 2016-06-02: 10 mg via INTRAVENOUS
  Filled 2016-06-02: qty 2

## 2016-06-02 NOTE — ED Triage Notes (Signed)
Per EMS pt developed a headache as well as N/V x 1 hour ago.  Pt is A&Ox 4.  Per EMS neurologically intact.  Hx of migraines and hypertension.  Took 1 BC powder w/o improvement.

## 2016-06-02 NOTE — ED Provider Notes (Signed)
[facility]-EMERGENCY DEPT Provider Note   CSN: [id number] Arrival date & time: 06/02/16  0330  By signing my name below, I, Bridgette HabermannMaria Tan, attest that this documentation has been prepared under the direction and in the presence of Gilda Creasehristopher J Laurren Lepkowski, MD. Electronically Signed: Bridgette HabermannMaria Tan, ED Scribe. 06/02/16. 3:49 AM.  History   Chief Complaint Chief Complaint  Patient presents with  . Headache   HPI Comments: Randall Cook is a 60 y.o. male with h/o migraines and HTN who presents to the Emergency Department by EMS complaining of sudden onset, constant headache onset 1.5 hours ago. Pt states the headache woke him from his sleep. Pt also has associated nausea, photophobia, and vomiting. Pt states pain is exacerbated with light. Pt took 1 BC power with no relief. He has h/o migraines and notes that this one is similar. Pt denies fever.   The history is provided by the patient. No language interpreter was used.    Past Medical History:  Diagnosis Date  . Arthritis   . Dysrhythmia    tachycardia- EKG,clearance 02/29/12 Dr Azucena CecilSwayne  on chart  . GERD (gastroesophageal reflux disease)   . Hyperlipidemia   . Hypertension    LOV,   Dr Azucena CecilSwayne 02/29/12 on chart-   CBC with diff, CMET 02/29/12 on chart  . Sleep apnea    STOP BANG SCORE 4    Patient Active Problem List   Diagnosis Date Noted  . 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 NO PAST SURGERIES    . PARTIAL KNEE ARTHROPLASTY  03/10/2012   Procedure: UNICOMPARTMENTAL KNEE;  Surgeon: Shelda PalMatthew D Olin, MD;  Location: [facility];  Service: Orthopedics;  Laterality: Left;       Home Medications    Prior to Admission medications   Medication Sig Start Date End Date Taking? Authorizing Provider  acetaminophen (TYLENOL) 500 MG tablet Take 500 mg by mouth daily as needed for mild pain, moderate pain, fever or headache.    Historical Provider, MD  Aspirin-Salicylamide-Caffeine (BC FAST PAIN RELIEF) 650-195-33.3 MG  PACK Take 1 packet by mouth daily as needed (for pain).    Historical Provider, MD  naproxen (NAPROSYN) 500 MG tablet Take 1 tablet (500 mg total) by mouth 2 (two) times daily. 04/30/16   Chase PicketJaime Pilcher Ward, PA-C  naproxen sodium (ANAPROX) 220 MG tablet Take 440 mg by mouth daily as needed (for pain).    Historical Provider, MD    Family History No family history on file.  Social History Social History  Substance Use Topics  . Smoking status: Former Smoker    Packs/day: 0.50    Years: 20.00    Types: Cigarettes    Quit date: 03/05/1999  . Smokeless tobacco: Never Used  . Alcohol use Yes     Comment: none x 20 yrs     Allergies   Penicillins   Review of Systems Review of Systems  Constitutional: Negative for fever.  Gastrointestinal: Positive for nausea and vomiting.  Neurological: Positive for headaches.  All other systems reviewed and are negative.    Physical Exam Updated Vital Signs BP 142/84 (BP Location: Left Arm)   Pulse (!) 55   Temp 97.5 F (36.4 C) (Oral)   Resp 13   SpO2 93%   Physical Exam  Constitutional: He is oriented to person, place, and time. He appears well-developed and well-nourished. No distress.  HENT:  Head: Normocephalic and atraumatic.  Right Ear: Hearing normal.  Left Ear: Hearing normal.  Nose: Nose normal.  Mouth/Throat: Oropharynx is clear and moist and mucous membranes are normal.  Eyes: Conjunctivae and EOM are normal. Pupils are equal, round, and reactive to light.  Photophobia.  Neck: Normal range of motion. Neck supple.  Cardiovascular: Regular rhythm, S1 normal and S2 normal.  Exam reveals no gallop and no friction rub.   No murmur heard. Pulmonary/Chest: Effort normal and breath sounds normal. No respiratory distress. He exhibits no tenderness.  Abdominal: Soft. Normal appearance and bowel sounds are normal. There is no hepatosplenomegaly. There is no tenderness. There is no rebound, no guarding, no tenderness at McBurney's  point and negative Murphy's sign. No hernia.  Musculoskeletal: Normal range of motion.  Neurological: He is alert and oriented to person, place, and time. He has normal strength. No cranial nerve deficit or sensory deficit. Coordination normal. GCS eye subscore is 4. GCS verbal subscore is 5. GCS motor subscore is 6.  Skin: Skin is warm, dry and intact. No rash noted. No cyanosis.  Psychiatric: He has a normal mood and affect. His speech is normal and behavior is normal. Thought content normal.  Nursing note and vitals reviewed.    ED Treatments / Results  DIAGNOSTIC STUDIES: Oxygen Saturation is 93% on RA, poor by my interpretation.    COORDINATION OF CARE: 3:47 AM Discussed treatment plan with pt at bedside and pt agreed to plan.  Labs (all labs ordered are listed, but only abnormal results are displayed) Labs Reviewed - No data to display  EKG  EKG Interpretation None       Radiology No results found.  Procedures Procedures (including critical care time)  Medications Ordered in ED Medications - No data to display   Initial Impression / Assessment and Plan / ED Course  I have reviewed the triage vital signs and the nursing notes.  Pertinent labs & imaging results that were available during my care of the patient were reviewed by me and considered in my medical decision making (see chart for details).  Clinical Course   Patient presents to the emergency department for evaluation of migraine headache. Patient is currently experiencing a headache that is similar to previous migraines. Patient does not have any unusual features compared to previous migraines. Patient has normal neurologic examination. There are no unusual features, such as unusual intensity or sudden onset. As this headache is similar to previous migraines, there is no concern for subarachnoid hemorrhage or other etiology. Patient therefore does not require imaging. Patient treated as migraine headache. Upon  reevaluation his headache is much improved and he feels well enough to go home. Will follow-up with primary doctor.  Final Clinical Impressions(s) / ED Diagnoses   Final diagnoses:  None  migraine  New Prescriptions New Prescriptions   No medications on file  I personally performed the services described in this documentation, which was scribed in my presence. The recorded information has been reviewed and is accurate.     Gilda Crease, MD 06/02/16 [phone]

## 2016-07-15 ENCOUNTER — Emergency Department (HOSPITAL_COMMUNITY)
Admission: EM | Admit: 2016-07-15 | Discharge: 2016-07-16 | Disposition: A | Discharge status: Home / Self Care | Payer: BLUE CROSS/BLUE SHIELD | Attending: Emergency Medicine | Admitting: Emergency Medicine

## 2016-07-15 ENCOUNTER — Emergency Department (HOSPITAL_COMMUNITY): Payer: BLUE CROSS/BLUE SHIELD

## 2016-07-15 ENCOUNTER — Encounter (HOSPITAL_COMMUNITY): Payer: Self-pay | Admitting: *Deleted

## 2016-07-15 DIAGNOSIS — W57XXXA Bitten or stung by nonvenomous insect and other nonvenomous arthropods, initial encounter: Secondary | ICD-10-CM | POA: Insufficient documentation

## 2016-07-15 DIAGNOSIS — Y939 Activity, unspecified: Secondary | ICD-10-CM | POA: Insufficient documentation

## 2016-07-15 DIAGNOSIS — Y92096 Garden or yard of other non-institutional residence as the place of occurrence of the external cause: Secondary | ICD-10-CM | POA: Insufficient documentation

## 2016-07-15 DIAGNOSIS — S60561A Insect bite (nonvenomous) of right hand, initial encounter: Secondary | ICD-10-CM | POA: Insufficient documentation

## 2016-07-15 DIAGNOSIS — Y999 Unspecified external cause status: Secondary | ICD-10-CM | POA: Insufficient documentation

## 2016-07-15 DIAGNOSIS — Z7982 Long term (current) use of aspirin: Secondary | ICD-10-CM | POA: Insufficient documentation

## 2016-07-15 DIAGNOSIS — Z87891 Personal history of nicotine dependence: Secondary | ICD-10-CM | POA: Insufficient documentation

## 2016-07-15 DIAGNOSIS — I10 Essential (primary) hypertension: Secondary | ICD-10-CM | POA: Insufficient documentation

## 2016-07-15 MED ORDER — NAPROXEN 500 MG PO TABS: 500.0000 mg | ORAL_TABLET | Freq: Two times a day (BID) | ORAL | 0 refills | Status: DC

## 2016-07-15 NOTE — ED Notes (Signed)
Called for room, no answer

## 2016-07-15 NOTE — ED Triage Notes (Signed)
Pt c/o right hand swelling starting today. Pt states he felt something sting him yesterday and his hand started itching. Swelling started while pt ws in church this morning. Pt denies pain.

## 2016-07-15 NOTE — ED Provider Notes (Signed)
[facility]-EMERGENCY DEPT Provider Note   CSN: [id number] Arrival date & time: 07/15/16  1848     History   Chief Complaint Chief Complaint  Patient presents with  . Hand Problem    HPI Randall Cook is a 60 y.o. male.  Patient presents with complaint of swelling to right hand since last night. He feels he was stung by something while working in the yard and the swelling started afterward. No significant pain or wound. No other complaints.   The history is provided by the patient. No language interpreter was used.    Past Medical History:  Diagnosis Date  . Arthritis   . Dysrhythmia    tachycardia- EKG,clearance 02/29/12 Dr Azucena CecilSwayne  on chart  . GERD (gastroesophageal reflux disease)   . Hyperlipidemia   . Hypertension    LOV,   Dr Azucena CecilSwayne 02/29/12 on chart-   CBC with diff, CMET 02/29/12 on chart  . Sleep apnea    STOP BANG SCORE 4    Patient Active Problem List   Diagnosis Date Noted  . 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 NO PAST SURGERIES    . PARTIAL KNEE ARTHROPLASTY  03/10/2012   Procedure: UNICOMPARTMENTAL KNEE;  Surgeon: Shelda PalMatthew D Olin, MD;  Location: [facility];  Service: Orthopedics;  Laterality: Left;       Home Medications    Prior to Admission medications   Medication Sig Start Date End Date Taking? Authorizing Provider  Aspirin-Salicylamide-Caffeine (BC FAST PAIN RELIEF) 650-195-33.3 MG PACK Take 1 packet by mouth daily as needed (for pain).    Historical Provider, MD  naproxen (NAPROSYN) 500 MG tablet Take 1 tablet (500 mg total) by mouth 2 (two) times daily. Patient not taking: Reported on 06/02/2016 04/30/16   Chase PicketJaime Pilcher Ward, PA-C    Family History History reviewed. No pertinent family history.  Social History Social History  Substance Use Topics  . Smoking status: Former Smoker    Packs/day: 0.50    Years: 20.00    Types: Cigarettes    Quit date: 03/05/1999  . Smokeless tobacco: Never Used  . Alcohol  use Yes     Comment: none x 20 yrs     Allergies   Penicillins   Review of Systems Review of Systems  Constitutional: Negative for chills and fever.  Musculoskeletal:       See HPI  Skin: Negative.  Negative for color change and wound.  Neurological: Negative.  Negative for numbness.     Physical Exam Updated Vital Signs BP 145/100 (BP Location: Left Arm)   Pulse 102   Temp 98.4 F (36.9 C) (Oral)   Resp 20   Ht 6\' 1"  (1.854 m)   Wt 108.9 kg   SpO2 96%   BMI 31.66 kg/m   Physical Exam  Constitutional: He is oriented to person, place, and time. He appears well-developed and well-nourished.  Neck: Normal range of motion.  Pulmonary/Chest: Effort normal.  Musculoskeletal: Normal range of motion.  Right hand swollen in generalized pattern. No redness or warmth. No wound visualized. FROM wrist and fingers limited only by swelling.   Neurological: He is alert and oriented to person, place, and time.  Skin: Skin is warm and dry. Capillary refill takes less than 2 seconds.  Psychiatric: He has a normal mood and affect.     ED Treatments / Results  Labs (all labs ordered are listed, but only abnormal results are displayed) Labs  Reviewed - No data to display  EKG  EKG Interpretation None       Radiology Dg Hand Complete Right  Result Date: 07/15/2016 CLINICAL DATA:  Right hand swelling and erythema. Possible insect bite yesterday. EXAM: RIGHT HAND - COMPLETE 3+ VIEW COMPARISON:  None. FINDINGS: The mineralization and alignment are normal. There is no evidence of acute fracture or dislocation. There is an old fracture of the fifth metacarpal. Mild degenerative changes are present at the metacarpal phalangeal and interphalangeal joints. No erosive changes are seen. There is prominent soft tissue swelling of the dorsum of the hand. No soft tissue emphysema or foreign body identified. There is no evidence of bone destruction. IMPRESSION: Dorsal soft tissue swelling. No  evidence of foreign body or acute osseous findings. Mild degenerative changes as described. Electronically Signed   By: Carey Bullocks M.D.   On: 07/15/2016 19:50    Procedures Procedures (including critical care time)  Medications Ordered in ED Medications - No data to display   Initial Impression / Assessment and Plan / ED Course  I have reviewed the triage vital signs and the nursing notes.  Pertinent labs & imaging results that were available during my care of the patient were reviewed by me and considered in my medical decision making (see chart for details).  Clinical Course    1. Insect bite  Right hand swelling after stinging sensation working in the yard. Likely bee/wasp sting. No evidence infection. Supportive care encouraged. Return precautions discussed.  Final Clinical Impressions(s) / ED Diagnoses   Final diagnoses:  None    New Prescriptions New Prescriptions   No medications on file     Elpidio Anis, PA-C 07/16/16 0121    Shon Baton, MD 07/16/16 [phone]

## 2016-07-15 NOTE — ED Notes (Signed)
Called pt three times. N/A.

## 2016-07-15 NOTE — ED Notes (Signed)
Pt was sitting in general triage waiting area.  Pt taken to sub-waiting.

## 2016-07-16 NOTE — ED Notes (Signed)
E-sig not available, pt verbalized understanding DC instructions and prescriptions.

## 2017-03-25 ENCOUNTER — Encounter (HOSPITAL_COMMUNITY): Payer: Self-pay | Admitting: Emergency Medicine

## 2017-03-25 ENCOUNTER — Emergency Department (HOSPITAL_COMMUNITY)
Admission: EM | Admit: 2017-03-25 | Discharge: 2017-03-25 | Disposition: A | Discharge status: Home / Self Care | Payer: BLUE CROSS/BLUE SHIELD | Attending: Emergency Medicine | Admitting: Emergency Medicine

## 2017-03-25 DIAGNOSIS — I10 Essential (primary) hypertension: Secondary | ICD-10-CM | POA: Insufficient documentation

## 2017-03-25 DIAGNOSIS — E785 Hyperlipidemia, unspecified: Secondary | ICD-10-CM | POA: Insufficient documentation

## 2017-03-25 DIAGNOSIS — Z87891 Personal history of nicotine dependence: Secondary | ICD-10-CM | POA: Insufficient documentation

## 2017-03-25 DIAGNOSIS — M545 Low back pain: Secondary | ICD-10-CM | POA: Insufficient documentation

## 2017-03-25 MED ORDER — METHOCARBAMOL 500 MG PO TABS: 500.0000 mg | ORAL_TABLET | Freq: Two times a day (BID) | ORAL | 0 refills | Status: DC | PRN

## 2017-03-25 MED ORDER — NAPROXEN 250 MG PO TABS: 250.0000 mg | ORAL_TABLET | Freq: Two times a day (BID) | ORAL | 0 refills | Status: DC

## 2017-03-25 NOTE — ED Notes (Signed)
ED Provider at bedside. 

## 2017-03-25 NOTE — ED Provider Notes (Signed)
[facility]-EMERGENCY DEPT Provider Note   CSN: [id number] Arrival date & time: 03/25/17  0610     History   Chief Complaint Chief Complaint  Patient presents with  . Back Pain    HPI Randall Cook is a 61 y.o. male.  Randall Cook is a 61 y.o. Male who presents to the ED complaining of right low back pain since yesterday. Patient reports he was bending over yesterday to lift something when his low back began to hurt him. He reports pain is worse in his right low back with movement. He has taken tylenol and icy hot today with some relief of his pain. He denies history of cancer or IV drug use. He denies fevers, chills, falls, numbness, tingling, weakness, loss of bladder control, loss of bowel control, urinary symptoms, rashes, abdominal pain, nausea, vomiting or other complaints.   The history is provided by the patient and medical records. No language interpreter was used.    Past Medical History:  Diagnosis Date  . Arthritis   . Dysrhythmia    tachycardia- EKG,clearance 02/29/12 Dr Azucena Cecil  on chart  . GERD (gastroesophageal reflux disease)   . Hyperlipidemia   . Hypertension    LOV,   Dr Azucena Cecil 02/29/12 on chart-   CBC with diff, [facility] 02/29/12 on chart  . Sleep apnea    STOP BANG SCORE 4    Patient Active Problem List   Diagnosis Date Noted  . 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 NO PAST SURGERIES    . PARTIAL KNEE ARTHROPLASTY  03/10/2012   Procedure: UNICOMPARTMENTAL KNEE;  Surgeon: [name], MD;  Location: [facility];  Service: Orthopedics;  Laterality: Left;       Home Medications    Prior to Admission medications   Medication Sig Start Date End Date Taking? Authorizing Provider  Aspirin-Salicylamide-Caffeine (BC FAST PAIN RELIEF) 650-195-33.3 MG PACK Take 1 packet by mouth daily as needed (for pain).    [provider]  methocarbamol (ROBAXIN) 500 MG tablet Take 1 tablet (500 mg total) by mouth 2 (two) times  daily as needed for muscle spasms. 03/25/17   Everlene Farrier, PA-C  naproxen (NAPROSYN) 250 MG tablet Take 1 tablet (250 mg total) by mouth 2 (two) times daily with a meal. 03/25/17   Everlene Farrier, PA-C    Family History No family history on file.  Social History Social History  Substance Use Topics  . Smoking status: Former Smoker    Packs/day: 0.50    Years: 20.00    Types: Cigarettes    Quit date: 03/05/1999  . Smokeless tobacco: Never Used  . Alcohol use Yes     Comment: none x 20 yrs     Allergies   Penicillins   Review of Systems Review of Systems  Constitutional: Negative for chills and fever.  Gastrointestinal: Negative for abdominal pain, nausea and vomiting.  Genitourinary: Negative for decreased urine volume, difficulty urinating, dysuria, flank pain, frequency and urgency.  Musculoskeletal: Positive for back pain. Negative for gait problem and neck pain.  Skin: Negative for rash and wound.  Neurological: Negative for weakness and numbness.     Physical Exam Updated Vital Signs BP (!) 143/110 (BP Location: Right Arm)   Pulse 71   Temp 97.6 F (36.4 C) (Oral)   Resp 17   Ht 6\' 1"  (1.854 m)   Wt 111.1 kg (245 lb)   SpO2 96%   BMI  32.32 kg/m   Physical Exam  Constitutional: He appears well-developed and well-nourished. No distress.  Nontoxic appearing.  HENT:  Head: Normocephalic and atraumatic.  Mouth/Throat: Oropharynx is clear and moist.  Eyes: Right eye exhibits no discharge. Left eye exhibits no discharge.  Cardiovascular: Normal rate, regular rhythm, normal heart sounds and intact distal pulses.   Pulmonary/Chest: Effort normal and breath sounds normal. No respiratory distress.  Abdominal: Soft. There is no tenderness.  Musculoskeletal: Normal range of motion. He exhibits tenderness. He exhibits no edema or deformity.  Tenderness overlying his right low back musculature. No midline neck or back tenderness. Back muscles appear to be in spasm. No  back erythema, deformity or ecchymosis. Good strength in his bilateral lower extremities.  Neurological: He is alert. He displays normal reflexes. No sensory deficit. Coordination normal.  Bilateral patellar DTRs are intact. Normal gait. No foot drop. Good strength with plantar and dorsiflexion bilaterally.  Skin: Skin is warm and dry. Capillary refill takes less than 2 seconds. No rash noted. He is not diaphoretic. No erythema. No pallor.  Psychiatric: He has a normal mood and affect. His behavior is normal.  Nursing note and vitals reviewed.    ED Treatments / Results  Labs (all labs ordered are listed, but only abnormal results are displayed) Labs Reviewed - No data to display  EKG  EKG Interpretation None       Radiology No results found.  Procedures Procedures (including critical care time)  Medications Ordered in ED Medications - No data to display   Initial Impression / Assessment and Plan / ED Course  I have reviewed the triage vital signs and the nursing notes.  Pertinent labs & imaging results that were available during my care of the patient were reviewed by me and considered in my medical decision making (see chart for details).    This is a 61 y.o. Male who presents to the ED complaining of right low back pain since yesterday. Patient reports he was bending over yesterday to lift something when his low back began to hurt him. He reports pain is worse in his right low back with movement.   Patient with right low back pain and spasm.   No neurological deficits and normal neuro exam.  Patient can walk with normal gait.   No loss of bowel or bladder control.  No concern for cauda equina.  No fever, night sweats, weight loss, h/o cancer, IVDU.  RICE protocol and pain medicine indicated and discussed with patient.  I advised the patient to follow-up with their primary care provider this week. I advised the patient to return to the emergency department with new or  worsening symptoms or new concerns. The patient verbalized understanding and agreement with plan.      Final Clinical Impressions(s) / ED Diagnoses   Final diagnoses:  Acute right-sided low back pain, with sciatica presence unspecified    New Prescriptions Discharge Medication List as of 03/25/2017  6:54 AM    START taking these medications   Details  methocarbamol (ROBAXIN) 500 MG tablet Take 1 tablet (500 mg total) by mouth 2 (two) times daily as needed for muscle spasms., Starting Mon 03/25/2017, Print         Everlene FarrierDansie, Billi Bright, PA-C 03/25/17 [facility] [id number]    Zadie RhineWickline, Donald, MD 03/28/17 [phone]

## 2017-03-25 NOTE — ED Triage Notes (Signed)
Pt reports recurrent lower back pain with pain beginning again on Sunday. C/o right lumbar pain after bending over to get something out of a drawer. Denies any issues with bladder or bowel, states that standing up makes pain worse.

## 2017-03-28 ENCOUNTER — Encounter (INDEPENDENT_AMBULATORY_CARE_PROVIDER_SITE_OTHER): Payer: Self-pay | Admitting: Physician Assistant

## 2017-03-28 ENCOUNTER — Ambulatory Visit (INDEPENDENT_AMBULATORY_CARE_PROVIDER_SITE_OTHER): Payer: Self-pay | Admitting: Physician Assistant

## 2017-03-28 VITALS — BP 161/108 | HR 70 | Temp 97.9°F | Ht 70.5 in | Wt 256.4 lb

## 2017-03-28 DIAGNOSIS — M5441 Lumbago with sciatica, right side: Secondary | ICD-10-CM

## 2017-03-28 DIAGNOSIS — I10 Essential (primary) hypertension: Secondary | ICD-10-CM

## 2017-03-28 MED ORDER — LOSARTAN POTASSIUM 50 MG PO TABS: 50.0000 mg | ORAL_TABLET | Freq: Every day | ORAL | 0 refills | Status: DC

## 2017-03-28 MED ORDER — HYDROCHLOROTHIAZIDE 25 MG PO TABS: 25.0000 mg | ORAL_TABLET | Freq: Every day | ORAL | 1 refills | Status: DC

## 2017-03-28 MED ORDER — PREDNISONE 20 MG PO TABS: 20.0000 mg | ORAL_TABLET | Freq: Every day | ORAL | 0 refills | Status: DC

## 2017-03-28 NOTE — Progress Notes (Deleted)
Subjective:  Patient ID: Randall Cook, male    DOB: 1956-05-25  Age: 61 y.o. MRN: [id number]  CC:  Chief Complaint  Patient presents with  . Follow-up    muscle spasms    HPI Randall Cook is a 61 y.o. male with a PMH of   Past Medical History:  Diagnosis Date  . Arthritis   . Dysrhythmia    tachycardia- EKG,clearance 02/29/12 Dr Azucena CecilSwayne  on chart  . GERD (gastroesophageal reflux disease)   . Hyperlipidemia   . Hypertension    LOV,   Dr Azucena CecilSwayne 02/29/12 on chart-   CBC with diff, [facility] 02/29/12 on chart  . Sleep apnea    STOP BANG SCORE 4    that presents with a {Numbers; 5-78:46962}0-30:31392} {Time; day/week/month:13537} history of     Outpatient Medications Prior to Visit  Medication Sig Dispense Refill  . Aspirin-Salicylamide-Caffeine (BC FAST PAIN RELIEF) 650-195-33.3 MG PACK Take 1 packet by mouth daily as needed (for pain).    . methocarbamol (ROBAXIN) 500 MG tablet Take 1 tablet (500 mg total) by mouth 2 (two) times daily as needed for muscle spasms. 20 tablet 0  . naproxen (NAPROSYN) 250 MG tablet Take 1 tablet (250 mg total) by mouth 2 (two) times daily with a meal. 30 tablet 0   No facility-administered medications prior to visit.      ROS ROS  Objective:  BP (!) 161/108 (BP Location: Right Arm, Patient Position: Sitting, Cuff Size: Large)   Pulse 70   Temp 97.9 F (36.6 C) (Oral)   Ht 5' 10.5" (1.791 m)   Wt 256 lb 6.4 oz (116.3 kg)   SpO2 95%   BMI 36.27 kg/m   BP/Weight 03/28/2017 03/25/2017 07/16/2016  Systolic BP 161 143 129  Diastolic BP 108 110 98  Wt. (Lbs) 256.4 245 -  BMI 36.27 32.32 -      Physical Exam   Assessment & Plan:   1. Acute right-sided low back pain with right-sided sciatica *** - predniSONE (DELTASONE) 20 MG tablet; Take 1 tablet (20 mg total) by mouth daily with breakfast.  Dispense: 5 tablet; Refill: 0  2. Hypertension, unspecified type *** - hydrochlorothiazide (HYDRODIURIL) 25 MG tablet; Take 1 tablet (25 mg total) by mouth  daily. Take on tablet in the morning.  Dispense: 90 tablet; Refill: 1 - CBC with Differential; Future - Comprehensive metabolic panel; Future - Lipid Panel; Future - losartan (COZAAR) 50 MG tablet; Take 1 tablet (50 mg total) by mouth daily.  Dispense: 30 tablet; Refill: 0   Meds ordered this encounter  Medications  . hydrochlorothiazide (HYDRODIURIL) 25 MG tablet    Sig: Take 1 tablet (25 mg total) by mouth daily. Take on tablet in the morning.    Dispense:  90 tablet    Refill:  1    Order Specific Question:   Supervising Provider    Answer:   Quentin AngstJEGEDE, OLUGBEMIGA E [id number]]  . losartan (COZAAR) 50 MG tablet    Sig: Take 1 tablet (50 mg total) by mouth daily.    Dispense:  30 tablet    Refill:  0    Order Specific Question:   Supervising Provider    Answer:   Quentin AngstJEGEDE, OLUGBEMIGA E [id number]  . predniSONE (DELTASONE) 20 MG tablet    Sig: Take 1 tablet (20 mg total) by mouth daily with breakfast.    Dispense:  5 tablet    Refill:  0    Order Specific Question:  Supervising Provider    Answer:   Quentin Angst [[id number]]    Follow-up: Return in about 2 weeks (around 04/11/2017).   Loletta Specter PA

## 2017-03-28 NOTE — Progress Notes (Signed)
Subjective:  Patient ID: Randall Cook, male    DOB: 23-Jul-1956  Age: 61 y.o. MRN: [id number]  CC: f/u muscle spasms  HPI Randall Cook is a 61 y.o. male with a PMH of HLD, HTN, GERD, and OSA presents on f/u of recent ED visit for muscle spasms of the lower back. Happened when he bent over to pick up an item off the floor. Item was not heavy and it seems to be poor mechanics that caused muscle spasm. He is feeling mild to moderately better with Naproxen and Robaxin. Denies saddle paresthesia, urinary incontinence, and bowel incontinence.    Pt's BP is noted to be elevated today. Says he used to take anti-hypertensives until he lost weight and exercised, at which time, he was told to stop taking anti-hypertensives by his PCP. Has not been dieting and exercising any more and has noted his blood pressure to be elevated. Denies CP, palpitations, SOB, HA, abdominal pain, f/c/n/v, rash, GI/GU sxs.    Outpatient Medications Prior to Visit  Medication Sig Dispense Refill  . Aspirin-Salicylamide-Caffeine (BC FAST PAIN RELIEF) 650-195-33.3 MG PACK Take 1 packet by mouth daily as needed (for pain).    . methocarbamol (ROBAXIN) 500 MG tablet Take 1 tablet (500 mg total) by mouth 2 (two) times daily as needed for muscle spasms. 20 tablet 0  . naproxen (NAPROSYN) 250 MG tablet Take 1 tablet (250 mg total) by mouth 2 (two) times daily with a meal. 30 tablet 0   No facility-administered medications prior to visit.      ROS Review of Systems  Constitutional: Negative for chills, fever and malaise/fatigue.  Eyes: Negative for blurred vision.  Respiratory: Negative for shortness of breath.   Cardiovascular: Negative for chest pain and palpitations.  Gastrointestinal: Negative for abdominal pain and nausea.  Genitourinary: Negative for dysuria and hematuria.  Musculoskeletal: Positive for back pain. Negative for joint pain and myalgias.  Skin: Negative for rash.  Neurological: Negative for tingling and  headaches.  Psychiatric/Behavioral: Negative for depression. The patient is not nervous/anxious.     Objective:  BP (!) 161/108 (BP Location: Right Arm, Patient Position: Sitting, Cuff Size: Large)   Pulse 70   Temp 97.9 F (36.6 C) (Oral)   Ht 5' 10.5" (1.791 m)   Wt 256 lb 6.4 oz (116.3 kg)   SpO2 95%   BMI 36.27 kg/m   BP/Weight 03/28/2017 03/25/2017 07/16/2016  Systolic BP 161 143 129  Diastolic BP 108 110 98  Wt. (Lbs) 256.4 245 -  BMI 36.27 32.32 -      Physical Exam  Constitutional: He is oriented to person, place, and time.  Well developed, obese, NAD, polite  HENT:  Head: Normocephalic and atraumatic.  Eyes: No scleral icterus.  Neck: Normal range of motion. Neck supple. No thyromegaly present.  Cardiovascular: Normal rate, regular rhythm and normal heart sounds.   Pulmonary/Chest: Effort normal and breath sounds normal.  Musculoskeletal: He exhibits no edema.  No increased muscular tonicity of the paraspinals. Mild TTP along the lumbar paraspinals.  Neurological: He is alert and oriented to person, place, and time. No cranial nerve deficit. Coordination normal.  Seated SLR positive on right side. Normal gait.  Skin: Skin is warm and dry. No rash noted. No erythema. No pallor.  Psychiatric: He has a normal mood and affect. His behavior is normal. Thought content normal.  Vitals reviewed.    Assessment & Plan:   1. Acute right-sided low back pain with right-sided sciatica -  predniSONE (DELTASONE) 20 MG tablet; Take 1 tablet (20 mg total) by mouth daily with breakfast.  Dispense: 5 tablet; Refill: 0  2. Hypertension, unspecified type -  Begin hydrochlorothiazide (HYDRODIURIL) 25 MG tablet; Take 1 tablet (25 mg total) by mouth daily. Take on tablet in the morning.  Dispense: 90 tablet; Refill: 1 - Begin losartan (COZAAR) 50 MG tablet; Take 1 tablet (50 mg total) by mouth daily.  Dispense: 30 tablet; Refill: 0 - CBC with Differential; Future - Comprehensive  metabolic panel; Future - Lipid Panel; Future    Meds ordered this encounter  Medications  . hydrochlorothiazide (HYDRODIURIL) 25 MG tablet    Sig: Take 1 tablet (25 mg total) by mouth daily. Take on tablet in the morning.    Dispense:  90 tablet    Refill:  1    Order Specific Question:   Supervising Provider    Answer:   Quentin Angst [id number]  . losartan (COZAAR) 50 MG tablet    Sig: Take 1 tablet (50 mg total) by mouth daily.    Dispense:  30 tablet    Refill:  0    Order Specific Question:   Supervising Provider    Answer:   Quentin Angst [id number]  . predniSONE (DELTASONE) 20 MG tablet    Sig: Take 1 tablet (20 mg total) by mouth daily with breakfast.    Dispense:  5 tablet    Refill:  0    Order Specific Question:   Supervising Provider    Answer:   Quentin Angst [[id number]]    Follow-up: Return in about 2 weeks (around 04/11/2017).   Loletta Specter PA

## 2017-03-28 NOTE — Patient Instructions (Signed)
Sciatica Sciatica is pain, numbness, weakness, or tingling along the path of the sciatic nerve. The sciatic nerve starts in the lower back and runs down the back of each leg. The nerve controls the muscles in the lower leg and in the back of the knee. It also provides feeling (sensation) to the back of the thigh, the lower leg, and the sole of the foot. Sciatica is a symptom of another medical condition that pinches or puts pressure on the sciatic nerve. Generally, sciatica only affects one side of the body. Sciatica usually goes away on its own or with treatment. In some cases, sciatica may keep coming back (recur). What are the causes? This condition is caused by pressure on the sciatic nerve, or pinching of the sciatic nerve. This may be the result of:  A disk in between the bones of the spine (vertebrae) bulging out too far (herniated disk).  Age-related changes in the spinal disks (degenerative disk disease).  A pain disorder that affects a muscle in the buttock (piriformis syndrome).  Extra bone growth (bone spur) near the sciatic nerve.  An injury or break (fracture) of the pelvis.  Pregnancy.  Tumor (rare). What increases the risk? The following factors may make you more likely to develop this condition:  Playing sports that place pressure or stress on the spine, such as football or weight lifting.  Having poor strength and flexibility.  A history of back injury.  A history of back surgery.  Sitting for long periods of time.  Doing activities that involve repetitive bending or lifting.  Obesity. What are the signs or symptoms? Symptoms can vary from mild to very severe, and they may include:  Any of these problems in the lower back, leg, hip, or buttock:  Mild tingling or dull aches.  Burning sensations.  Sharp pains.  Numbness in the back of the calf or the sole of the foot.  Leg weakness.  Severe back pain that makes movement difficult. These symptoms may  get worse when you cough, sneeze, or laugh, or when you sit or stand for long periods of time. Being overweight may also make symptoms worse. In some cases, symptoms may recur over time. How is this diagnosed? This condition may be diagnosed based on:  Your symptoms.  A physical exam. Your health care provider may ask you to do certain movements to check whether those movements trigger your symptoms.  You may have tests, including:  Blood tests.  X-rays.  MRI.  CT scan. How is this treated? In many cases, this condition improves on its own, without any treatment. However, treatment may include:  Reducing or modifying physical activity during periods of pain.  Exercising and stretching to strengthen your abdomen and improve the flexibility of your spine.  Icing and applying heat to the affected area.  Medicines that help:  To relieve pain and swelling.  To relax your muscles.  Injections of medicines that help to relieve pain, irritation, and inflammation around the sciatic nerve (steroids).  Surgery. Follow these instructions at home: Medicines   Take over-the-counter and prescription medicines only as told by your health care provider.  Do not drive or operate heavy machinery while taking prescription pain medicine. Managing pain   If directed, apply ice to the affected area.  Put ice in a plastic bag.  Place a towel between your skin and the bag.  Leave the ice on for 20 minutes, 2-3 times a day.  After icing, apply heat to the   heat to the affected area before you exercise or as often as told by your health care provider. Use the heat source that your health care provider recommends, such as a moist heat pack or a heating pad. ? Place a towel between your skin and the heat source. ? Leave the heat on for 20-30 minutes. ? Remove the heat if your skin turns bright red. This is especially important if you are unable to feel pain, heat, or cold. You may have a  greater risk of getting burned. Activity  Return to your normal activities as told by your health care provider. Ask your health care provider what activities are safe for you. ? Avoid activities that make your symptoms worse.  Take brief periods of rest throughout the day. Resting in a lying or standing position is usually better than sitting to rest. ? When you rest for longer periods, mix in some mild activity or stretching between periods of rest. This will help to prevent stiffness and pain. ? Avoid sitting for long periods of time without moving. Get up and move around at least one time each hour.  Exercise and stretch regularly, as told by your health care provider.  Do not lift anything that is heavier than 10 lb (4.5 kg) while you have symptoms of sciatica. When you do not have symptoms, you should still avoid heavy lifting, especially repetitive heavy lifting.  When you lift objects, always use proper lifting technique, which includes: ? Bending your knees. ? Keeping the load close to your body. ? Avoiding twisting. General instructions  Use good posture. ? Avoid leaning forward while sitting. ? Avoid hunching over while standing.  Maintain a healthy weight. Excess weight puts extra stress on your back and makes it difficult to maintain good posture.  Wear supportive, comfortable shoes. Avoid wearing high heels.  Avoid sleeping on a mattress that is too soft or too hard. A mattress that is firm enough to support your back when you sleep may help to reduce your pain.  Keep all follow-up visits as told by your health care provider. This is important. Contact a health care provider if:  You have pain that wakes you up when you are sleeping.  You have pain that gets worse when you lie down.  Your pain is worse than you have experienced in the past.  Your pain lasts longer than 4 weeks.  You experience unexplained weight loss. Get help right away if:  You lose control  of your bowel or bladder (incontinence).  You have: ? Weakness in your lower back, pelvis, buttocks, or legs that gets worse. ? Redness or swelling of your back. ? A burning sensation when you urinate. This information is not intended to replace advice given to you by your health care provider. Make sure you discuss any questions you have with your health care provider. Document Released: 09/04/2001 Document Revised: 02/14/2016 Document Reviewed: 05/20/2015 Elsevier Interactive Patient Education  2017 [employer] Hypertension Hypertension, commonly called high blood pressure, is when the force of blood pumping through the arteries is too strong. The arteries are the blood vessels that carry blood from the heart throughout the body. Hypertension forces the heart to work harder to pump blood and may cause arteries to become narrow or stiff. Having untreated or uncontrolled hypertension can cause heart attacks, strokes, kidney disease, and other problems. A blood pressure reading consists of a higher number over a lower number. Ideally, your blood pressure should be below 120/80.  The first ("top") number is called the systolic pressure. It is a measure of the pressure in your arteries as your heart beats. The second ("bottom") number is called the diastolic pressure. It is a measure of the pressure in your arteries as the heart relaxes. What are the causes? The cause of this condition is not known. What increases the risk? Some risk factors for high blood pressure are under your control. Others are not. Factors you can change  Smoking.  Having type 2 diabetes mellitus, high cholesterol, or both.  Not getting enough exercise or physical activity.  Being overweight.  Having too much fat, sugar, calories, or salt (sodium) in your diet.  Drinking too much alcohol. Factors that are difficult or impossible to change  Having chronic kidney disease.  Having a family history of high blood  pressure.  Age. Risk increases with age.  Race. You may be at higher risk if you are African-American.  Gender. Men are at higher risk than women before age 74. After age 48, women are at higher risk than men.  Having obstructive sleep apnea.  Stress. What are the signs or symptoms? Extremely high blood pressure (hypertensive crisis) may cause:  Headache.  Anxiety.  Shortness of breath.  Nosebleed.  Nausea and vomiting.  Severe chest pain.  Jerky movements you cannot control (seizures).  How is this diagnosed? This condition is diagnosed by measuring your blood pressure while you are seated, with your arm resting on a surface. The cuff of the blood pressure monitor will be placed directly against the skin of your upper arm at the level of your heart. It should be measured at least twice using the same arm. Certain conditions can cause a difference in blood pressure between your right and left arms. Certain factors can cause blood pressure readings to be lower or higher than normal (elevated) for a short period of time:  When your blood pressure is higher when you are in a health care provider's office than when you are at home, this is called white coat hypertension. Most people with this condition do not need medicines.  When your blood pressure is higher at home than when you are in a health care provider's office, this is called masked hypertension. Most people with this condition may need medicines to control blood pressure.  If you have a high blood pressure reading during one visit or you have normal blood pressure with other risk factors:  You may be asked to return on a different day to have your blood pressure checked again.  You may be asked to monitor your blood pressure at home for 1 week or longer.  If you are diagnosed with hypertension, you may have other blood or imaging tests to help your health care provider understand your overall risk for other  conditions. How is this treated? This condition is treated by making healthy lifestyle changes, such as eating healthy foods, exercising more, and reducing your alcohol intake. Your health care provider may prescribe medicine if lifestyle changes are not enough to get your blood pressure under control, and if:  Your systolic blood pressure is above 130.  Your diastolic blood pressure is above 80.  Your personal target blood pressure may vary depending on your medical conditions, your age, and other factors. Follow these instructions at home: Eating and drinking  Eat a diet that is high in fiber and potassium, and low in sodium, added sugar, and fat. An example eating plan is called the  DASH (Dietary Approaches to Stop Hypertension) diet. To eat this way: ? Eat plenty of fresh fruits and vegetables. Try to fill half of your plate at each meal with fruits and vegetables. ? Eat whole grains, such as whole wheat pasta, brown rice, or whole grain bread. Fill about one quarter of your plate with whole grains. ? Eat or drink low-fat dairy products, such as skim milk or low-fat yogurt. ? Avoid fatty cuts of meat, processed or cured meats, and poultry with skin. Fill about one quarter of your plate with lean proteins, such as fish, chicken without skin, beans, eggs, and tofu. ? Avoid premade and processed foods. These tend to be higher in sodium, added sugar, and fat.  Reduce your daily sodium intake. Most people with hypertension should eat less than 1,500 mg of sodium a day.  Limit alcohol intake to no more than 1 drink a day for nonpregnant women and 2 drinks a day for men. One drink equals 12 oz of beer, 5 oz of wine, or 1 oz of hard liquor. Lifestyle  Work with your health care provider to maintain a healthy body weight or to lose weight. Ask what an ideal weight is for you.  Get at least 30 minutes of exercise that causes your heart to beat faster (aerobic exercise) most days of the week.  Activities may include walking, swimming, or biking.  Include exercise to strengthen your muscles (resistance exercise), such as pilates or lifting weights, as part of your weekly exercise routine. Try to do these types of exercises for 30 minutes at least 3 days a week.  Do not use any products that contain nicotine or tobacco, such as cigarettes and e-cigarettes. If you need help quitting, ask your health care provider.  Monitor your blood pressure at home as told by your health care provider.  Keep all follow-up visits as told by your health care provider. This is important. Medicines  Take over-the-counter and prescription medicines only as told by your health care provider. Follow directions carefully. Blood pressure medicines must be taken as prescribed.  Do not skip doses of blood pressure medicine. Doing this puts you at risk for problems and can make the medicine less effective.  Ask your health care provider about side effects or reactions to medicines that you should watch for. Contact a health care provider if:  You think you are having a reaction to a medicine you are taking.  You have headaches that keep coming back (recurring).  You feel dizzy.  You have swelling in your ankles.  You have trouble with your vision. Get help right away if:  You develop a severe headache or confusion.  You have unusual weakness or numbness.  You feel faint.  You have severe pain in your chest or abdomen.  You vomit repeatedly.  You have trouble breathing. Summary  Hypertension is when the force of blood pumping through your arteries is too strong. If this condition is not controlled, it may put you at risk for serious complications.  Your personal target blood pressure may vary depending on your medical conditions, your age, and other factors. For most people, a normal blood pressure is less than 120/80.  Hypertension is treated with lifestyle changes, medicines, or a  combination of both. Lifestyle changes include weight loss, eating a healthy, low-sodium diet, exercising more, and limiting alcohol. This information is not intended to replace advice given to you by your health care provider. Make sure you discuss any questions  you have with your health care provider. Document Released: 09/10/2005 Document Revised: 08/08/2016 Document Reviewed: 08/08/2016 Elsevier Interactive Patient Education  [employer]2018 [employer] Inc[employer]

## 2017-04-08 ENCOUNTER — Telehealth (INDEPENDENT_AMBULATORY_CARE_PROVIDER_SITE_OTHER): Payer: Self-pay | Admitting: Physician Assistant

## 2017-04-08 ENCOUNTER — Other Ambulatory Visit (INDEPENDENT_AMBULATORY_CARE_PROVIDER_SITE_OTHER): Payer: Self-pay

## 2017-04-08 DIAGNOSIS — I10 Essential (primary) hypertension: Secondary | ICD-10-CM

## 2017-04-08 NOTE — Telephone Encounter (Signed)
Patient called back and was given the information. Randall Cook S [name], CMA

## 2017-04-08 NOTE — Telephone Encounter (Signed)
Left patient a message to call office back. Randall Cook, CMA

## 2017-04-08 NOTE — Telephone Encounter (Signed)
Please tell pt that he should make sure to drink 8-10 cups of water per day. 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 I will review his labs once resulted. Make appointment to be seen sooner if symptoms persist despite adequate hydration.

## 2017-04-08 NOTE — Telephone Encounter (Signed)
FWD to PCP. Tempestt S Roberts, CMA  

## 2017-04-08 NOTE — Telephone Encounter (Signed)
Patient came in for labs and stated that concerned because while at work he almost blacked out and was dizzy. He did not pass out.  Only felt dizzy. Patient sat down felt better but vision was blurry.  Patient concerned it might be his blood pressure medication but needs to know if needs to adjust medication.  Stated he is taking 2 blood pressure medication, but not sure if both are for blood pressure.  Please follow up with patient.

## 2017-04-09 ENCOUNTER — Other Ambulatory Visit (INDEPENDENT_AMBULATORY_CARE_PROVIDER_SITE_OTHER): Payer: Self-pay | Admitting: Physician Assistant

## 2017-04-09 DIAGNOSIS — E785 Hyperlipidemia, unspecified: Secondary | ICD-10-CM | POA: Insufficient documentation

## 2017-04-09 LAB — COMPREHENSIVE METABOLIC PANEL
ALT: 19 IU/L (ref 0–44)
AST: 26 IU/L (ref 0–40)
Albumin/Globulin Ratio: 1.5 (ref 1.2–2.2)
Albumin: 4.3 g/dL (ref 3.6–4.8)
Alkaline Phosphatase: 82 IU/L (ref 39–117)
BUN/Creatinine Ratio: 13 (ref 10–24)
BUN: 20 mg/dL (ref 8–27)
Bilirubin Total: 0.6 mg/dL (ref 0.0–1.2)
CO2: 24 mmol/L (ref 20–29)
Calcium: 9.6 mg/dL (ref 8.6–10.2)
Chloride: 97 mmol/L (ref 96–106)
Creatinine, Ser: 1.49 mg/dL — ABNORMAL HIGH (ref 0.76–1.27)
GFR calc Af Amer: 58 mL/min/{1.73_m2} — ABNORMAL LOW (ref >59)
GFR, EST NON AFRICAN AMERICAN: 50 mL/min/{1.73_m2} — AB (ref >59)
Globulin, Total: 2.8 g/dL (ref 1.5–4.5)
Glucose: 83 mg/dL (ref 65–99)
POTASSIUM: 4.2 mmol/L (ref 3.5–5.2)
Sodium: 139 mmol/L (ref 134–144)
TOTAL PROTEIN: 7.1 g/dL (ref 6.0–8.5)

## 2017-04-09 LAB — CBC WITH DIFFERENTIAL/PLATELET
BASOS: 1 %
Basophils Absolute: 0.1 10*3/uL (ref 0.0–0.2)
EOS (ABSOLUTE): 0 10*3/uL (ref 0.0–0.4)
EOS: 0 %
HEMATOCRIT: 45.5 % (ref 37.5–51.0)
Hemoglobin: 15.1 g/dL (ref 13.0–17.7)
IMMATURE GRANS (ABS): 0 10*3/uL (ref 0.0–0.1)
IMMATURE GRANULOCYTES: 0 %
LYMPHS: 18 %
Lymphocytes Absolute: 1.7 10*3/uL (ref 0.7–3.1)
MCH: 31.5 pg (ref 26.6–33.0)
MCHC: 33.2 g/dL (ref 31.5–35.7)
MCV: 95 fL (ref 79–97)
Monocytes Absolute: 0.6 10*3/uL (ref 0.1–0.9)
Monocytes: 7 %
NEUTROS PCT: 74 %
Neutrophils Absolute: 7.3 10*3/uL — ABNORMAL HIGH (ref 1.4–7.0)
PLATELETS: 260 10*3/uL (ref 150–379)
RBC: 4.79 x10E6/uL (ref 4.14–5.80)
RDW: 13.9 % (ref 12.3–15.4)
WBC: 9.7 10*3/uL (ref 3.4–10.8)

## 2017-04-09 LAB — LIPID PANEL
CHOL/HDL RATIO: 3.7 ratio (ref 0.0–5.0)
CHOLESTEROL TOTAL: 218 mg/dL — AB (ref 100–199)
HDL: 59 mg/dL (ref >39)
LDL CALC: 134 mg/dL — AB (ref 0–99)
TRIGLYCERIDES: 123 mg/dL (ref 0–149)
VLDL CHOLESTEROL CAL: 25 mg/dL (ref 5–40)

## 2017-04-09 MED ORDER — LOVASTATIN 20 MG PO TABS: 20.0000 mg | ORAL_TABLET | Freq: Every day | ORAL | 3 refills | Status: DC

## 2017-04-09 NOTE — Telephone Encounter (Signed)
Patient called left voicemail would like to know lab result.  Tempest contacted patient at 2;09 pm to inform labs

## 2017-04-11 ENCOUNTER — Ambulatory Visit (HOSPITAL_COMMUNITY)
Admission: RE | Admit: 2017-04-11 | Discharge: 2017-04-11 | Disposition: A | Discharge status: Home / Self Care | Payer: Self-pay | Source: Ambulatory Visit | Attending: Physician Assistant | Admitting: Physician Assistant

## 2017-04-11 ENCOUNTER — Ambulatory Visit (INDEPENDENT_AMBULATORY_CARE_PROVIDER_SITE_OTHER): Payer: Self-pay | Admitting: Physician Assistant

## 2017-04-11 ENCOUNTER — Encounter (INDEPENDENT_AMBULATORY_CARE_PROVIDER_SITE_OTHER): Payer: Self-pay | Admitting: Physician Assistant

## 2017-04-11 VITALS — BP 118/81 | HR 90 | Temp 97.8°F | Wt 255.6 lb

## 2017-04-11 DIAGNOSIS — M25561 Pain in right knee: Secondary | ICD-10-CM

## 2017-04-11 DIAGNOSIS — G8929 Other chronic pain: Secondary | ICD-10-CM

## 2017-04-11 DIAGNOSIS — M25562 Pain in left knee: Secondary | ICD-10-CM

## 2017-04-11 DIAGNOSIS — I10 Essential (primary) hypertension: Secondary | ICD-10-CM

## 2017-04-11 DIAGNOSIS — M170 Bilateral primary osteoarthritis of knee: Secondary | ICD-10-CM | POA: Insufficient documentation

## 2017-04-11 DIAGNOSIS — Z96652 Presence of left artificial knee joint: Secondary | ICD-10-CM | POA: Insufficient documentation

## 2017-04-11 DIAGNOSIS — R7989 Other specified abnormal findings of blood chemistry: Secondary | ICD-10-CM

## 2017-04-11 MED ORDER — LOSARTAN POTASSIUM 50 MG PO TABS: 50.0000 mg | ORAL_TABLET | Freq: Every day | ORAL | 1 refills | Status: DC

## 2017-04-11 MED ORDER — ACETAMINOPHEN 500 MG PO TABS: 1000.0000 mg | ORAL_TABLET | Freq: Three times a day (TID) | ORAL | 0 refills | Status: AC | PRN

## 2017-04-11 NOTE — Progress Notes (Signed)
Subjective:  Patient ID: Randall Cook, male    DOB: [birth year]  Age: 61 y.o. MRN: [id number]  CC: HTN and leg pain  HPI Randall Cook is a 61 y.o. male with a PMH of HTN, HLD, dysrhythmia, OSA, and GERD presents of f/u of HTN. Last visit BP 161/108. Prescribed HCTZ 25 mg and Losartan 50 mg. BP today is 118/81. Generally feels well and reports no side effects on anti-hypertensives. Denies CP, palpitations, SOB, HA, abdominal pain, f/c/n/v, rash, LE edema, or GI/GU sxs.   Only complaint is of bilateral knee pain for many years. Had a partial left knee replacement and sometimes feels sharp pains in the lateral aspect of left knee. Right knee is also painful with pain felt mostly in the popliteal region. Complains of stiffness and limited flexion of both knees. Denies radiculopathy and paresthesia of the lower extremities.     Outpatient Medications Prior to Visit  Medication Sig Dispense Refill  . hydrochlorothiazide (HYDRODIURIL) 25 MG tablet Take 1 tablet (25 mg total) by mouth daily. Take on tablet in the morning. 90 tablet 1  . lovastatin (MEVACOR) 20 MG tablet Take 1 tablet (20 mg total) by mouth at bedtime. 90 tablet 3  . methocarbamol (ROBAXIN) 500 MG tablet Take 1 tablet (500 mg total) by mouth 2 (two) times daily as needed for muscle spasms. 20 tablet 0  . losartan (COZAAR) 50 MG tablet Take 1 tablet (50 mg total) by mouth daily. 30 tablet 0  . naproxen (NAPROSYN) 250 MG tablet Take 1 tablet (250 mg total) by mouth 2 (two) times daily with a meal. 30 tablet 0  . predniSONE (DELTASONE) 20 MG tablet Take 1 tablet (20 mg total) by mouth daily with breakfast. 5 tablet 0  . Aspirin-Salicylamide-Caffeine (BC FAST PAIN RELIEF) 650-195-33.3 MG PACK Take 1 packet by mouth daily as needed (for pain).     No facility-administered medications prior to visit.      ROS Review of Systems  Constitutional: Negative for chills, fever and malaise/fatigue.  Eyes: Negative for blurred vision.   Respiratory: Negative for shortness of breath.   Cardiovascular: Negative for chest pain and palpitations.  Gastrointestinal: Negative for abdominal pain and nausea.  Genitourinary: Negative for dysuria and hematuria.  Musculoskeletal: Positive for joint pain. Negative for myalgias.  Skin: Negative for rash.  Neurological: Negative for tingling and headaches.  Psychiatric/Behavioral: Negative for depression. The patient is not nervous/anxious.     Objective:  BP 118/81 (BP Location: Left Arm, Patient Position: Sitting, Cuff Size: Large)   Pulse 90   Temp 97.8 F (36.6 C) (Oral)   Wt 255 lb 9.6 oz (115.9 kg)   SpO2 92%   BMI 36.16 kg/m   BP/Weight 04/11/2017 03/28/2017 03/25/2017  Systolic BP 118 161 143  Diastolic BP 81 108 110  Wt. (Lbs) 255.6 256.4 245  BMI 36.16 36.27 32.32      Physical Exam  Constitutional: He is oriented to person, place, and time.  Well developed, overweight, NAD, polite  HENT:  Head: Normocephalic and atraumatic.  Eyes: No scleral icterus.  Neck: Normal range of motion. Neck supple. No thyromegaly present.  Cardiovascular: Normal rate, regular rhythm and normal heart sounds.   Pulmonary/Chest: Effort normal and breath sounds normal.  Musculoskeletal: He exhibits no edema.  Knees without deformity, midline joint tenderness, increased laxity of MCL or LCL. Negative posterior and anterior drawer. Negative Baker's cyst. No edema, erythema, or ecchymosis. Full extension, reduced flexion of both knees to approximately  110 degrees.  Neurological: He is alert and oriented to person, place, and time.  Skin: Skin is warm and dry. No rash noted. No erythema. No pallor.  Psychiatric: He has a normal mood and affect. His behavior is normal. Thought content normal.  Vitals reviewed.    Assessment & Plan:   1. Hypertension, unspecified type - Refill losartan (COZAAR) 50 MG tablet; Take 1 tablet (50 mg total) by mouth daily.  Dispense: 90 tablet; Refill: 1 -  Continue HCTZ 25 mg qam  2. Chronic pain of both knees - DG Knee Complete 4 Views Left; Future - DG Knee Complete 4 Views Right; Future - Ambulatory referral to Physical Therapy  3. Elevated serum creatinine - BMP  Meds ordered this encounter  Medications  . losartan (COZAAR) 50 MG tablet    Sig: Take 1 tablet (50 mg total) by mouth daily.    Dispense:  90 tablet    Refill:  1    Order Specific Question:   Supervising Provider    Answer:   Quentin Angst [id number]  . acetaminophen (TYLENOL) 500 MG tablet    Sig: Take 2 tablets (1,000 mg total) by mouth every 8 (eight) hours as needed.    Dispense:  180 tablet    Refill:  0    Order Specific Question:   Supervising Provider    Answer:   Quentin Angst [id number]    Follow-up: Return in about 4 weeks (around 05/09/2017) for knee pain.   Loletta Specter PA

## 2017-04-11 NOTE — Patient Instructions (Signed)

## 2017-04-12 LAB — BASIC METABOLIC PANEL
BUN/Creatinine Ratio: 14 (ref 10–24)
BUN: 17 mg/dL (ref 8–27)
CHLORIDE: 101 mmol/L (ref 96–106)
CO2: 23 mmol/L (ref 20–29)
Calcium: 9.3 mg/dL (ref 8.6–10.2)
Creatinine, Ser: 1.2 mg/dL (ref 0.76–1.27)
GFR, EST AFRICAN AMERICAN: 76 mL/min/{1.73_m2} (ref >59)
GFR, EST NON AFRICAN AMERICAN: 65 mL/min/{1.73_m2} (ref >59)
Glucose: 82 mg/dL (ref 65–99)
POTASSIUM: 4.5 mmol/L (ref 3.5–5.2)
SODIUM: 140 mmol/L (ref 134–144)

## 2017-04-26 ENCOUNTER — Ambulatory Visit: Payer: Self-pay | Admitting: Physical Therapy

## 2017-07-12 ENCOUNTER — Emergency Department (HOSPITAL_BASED_OUTPATIENT_CLINIC_OR_DEPARTMENT_OTHER): Admit: 2017-07-12 | Discharge: 2017-07-12 | Disposition: A | Discharge status: Home / Self Care | Payer: Self-pay

## 2017-07-12 ENCOUNTER — Encounter (HOSPITAL_COMMUNITY): Payer: Self-pay

## 2017-07-12 ENCOUNTER — Emergency Department (HOSPITAL_COMMUNITY)
Admission: EM | Admit: 2017-07-12 | Discharge: 2017-07-12 | Disposition: A | Discharge status: Home / Self Care | Payer: Self-pay | Attending: Emergency Medicine | Admitting: Emergency Medicine

## 2017-07-12 ENCOUNTER — Emergency Department (HOSPITAL_COMMUNITY): Payer: Self-pay

## 2017-07-12 DIAGNOSIS — M25562 Pain in left knee: Secondary | ICD-10-CM | POA: Insufficient documentation

## 2017-07-12 DIAGNOSIS — Z96652 Presence of left artificial knee joint: Secondary | ICD-10-CM | POA: Insufficient documentation

## 2017-07-12 DIAGNOSIS — M25561 Pain in right knee: Secondary | ICD-10-CM | POA: Insufficient documentation

## 2017-07-12 DIAGNOSIS — M79609 Pain in unspecified limb: Secondary | ICD-10-CM

## 2017-07-12 NOTE — ED Provider Notes (Signed)
[facility] Emergency Department Provider Note  ED Clinical Impression  Pain in both knees, unspecified chronicity  History   Chief Complaint Knee Pain   HPI  Patient is a 61 y.o. male with a PMH of arthritis, GERD, htn, and hld who presents to ED for bilateral knee pain, states initial onset approx 20 years ago but yesterday he was getting out of truck and felt his right knee "give out," subsequently falling onto both knees, now with pain to bilateral knees, describes as dull ache, no radiation, has tried Tylenol with mild relief. Able to ambulate since. Previous hx of left partial knee arthroplasty in 2013 by Dr. Charlann Boxerlin. No extremity numbness or tingling. Denies redness/swelling/warmth to area. No head trauma or LOC. Denies fevers, chills, unexplained weight loss, dizziness, headaches, neck pain, CP, SOB, cough, abd pain, n/v/d, dysuria, extremity numbness or tingling, calf pain/swelling, or any additional concerns. No hx of DVT/PE. No anticoagulant use.  Past Medical History:  Diagnosis Date  . Arthritis   . Dysrhythmia    tachycardia- EKG,clearance 02/29/12 Dr Azucena CecilSwayne  on chart  . GERD (gastroesophageal reflux disease)   . Hyperlipidemia   . Hypertension    LOV,   Dr Azucena CecilSwayne 02/29/12 on chart-   CBC with diff, CMET 02/29/12 on chart  . Sleep apnea    STOP BANG SCORE 4    Past Surgical History:  Procedure Laterality Date  . NO PAST SURGERIES    . PARTIAL KNEE ARTHROPLASTY  03/10/2012   Procedure: UNICOMPARTMENTAL KNEE;  Surgeon: Shelda PalMatthew D Olin, MD;  Location: [facility];  Service: Orthopedics;  Laterality: Left;    Current Outpatient Rx  . Order #: [id number] Class: Normal  . Order #: [id number] Class: Normal  . Order #: [id number] Class: Normal  . Order #: [id number] Class: Print  . Order #: [id number] Class: Normal    Allergies Penicillins  No family history on file.  Social History Social History  Substance Use Topics  . Smoking status: Former Smoker    Packs/day: 0.50    Years:  20.00    Types: Cigarettes    Quit date: 03/05/1999  . Smokeless tobacco: Never Used  . Alcohol use Yes     Comment: none x 20 yrs    Review of Systems  Constitutional: Negative for fever, chills, or unexplained weight loss. Eyes: Negative for visual changes. Cardiovascular: Negative for chest pain, palpitations, or extremity swelling. Respiratory: Negative for shortness of breath or cough. Gastrointestinal: Negative for abdominal pain, nausea, vomiting, or diarrhea. Genitourinary: Negative for dysuria or hematuria. Musculoskeletal: +knee pain. Negative for back pain. Skin: Negative for rash. Neurological: Negative for headaches, dizziness, focal weakness, or numbness/tingling.  Physical Exam   VITAL SIGNS:   ED Triage Vitals  Enc Vitals Group     BP 07/12/17 1149 111/81     Pulse Rate 07/12/17 1149 63     Resp 07/12/17 1149 18     Temp 07/12/17 1149 97.8 F (36.6 C)     Temp Source 07/12/17 1149 Oral     SpO2 07/12/17 1149 95 %     Weight --      Height --      Head Circumference --      Peak Flow --      Pain Score 07/12/17 0834 9     Pain Loc --      Pain Edu? --      Excl. in GC? --     Constitutional: Alert and oriented. Well appearing and in no respiratory  apparent distress. Eyes: PERRL, EOMI, Conjunctivae normal ENT      Head: Normocephalic and atraumatic.      Ears: TM intact bilaterally without erythema or effusion, no hemotympanum, external ear canals normal.       Mouth/Throat: Mucous membranes are moist. Oropharynx without erythema or exudate. No trismus. Normal voice, handling secretions normally.      Neck: Supple, no nuchal signs, no cervical midline tenderness, full active ROM of neck.  Cardiovascular: Normal S1 S2, regular rhythm, normal rate. Normal and symmetric distal pulses are present in all extremities. Respiratory: Breath sounds clear and equal bilaterally. No wheezes, rales, or rhonchi. Normal respiratory effort.  Gastrointestinal: Abdomen  soft and nontender. No rebound or guarding. There is no CVA tenderness. Back: No midline or paraspinal tenderness.  Musculoskeletal: + bilateral anterior knees very mildly ttp, full active ROM; no sig effusion, no STS; skin intact without erythema/warmth/ecchymosis to area. No medial or lateral joint line tenderness. Negative anterior posterior drawer, no patellar tenderness, fibular head nontender, able to flex 90 degrees. Sharp and dull sensation grossly intact to bilateral LE, dorsalis pedis and posterior tibial pulses 2+, cap refill < 2sec. No calf pain, erythema, or generalized swelling noted. No acute signs in all other extremities. Neurologic: Speech clear. Alert and appropriate, no gross focal neurologic deficits are appreciated. Gait steady with ambulation. Equal strength in all four extremities. Extremities neurovascularly intact.  Skin: Skin is warm, dry, and intact. No rash noted. Psychiatric: Mood and affect are normal. Speech and behavior are normal.  Labs   Labs Reviewed - No data to display  Radiology   DG Knee Complete 4 Views Left    (Results Pending)  DG Knee Complete 4 Views Right    (Results Pending)     ED Course, Assessment and Plan   Patient is a 61 y/o M, afebrile, who presents to ED for bilateral knee pain, concern for arthritis. Will get xray for acute osseous abnormality given recent fall. Denies preceding symptoms prior to call, no acute neuro deficits. No evidence of neurovascular compromise. Low suspicion for DVT (no calf pain or swelling, no hx of malignancy, no recent surgery, no edema). Compartments soft to bilateral LE, doubt compartment syndrome. Doubt osteomyelitis, gout, or septic joint. Will get imaging and re-eval.  3:39 PM Xray of bilateral knees negative for acute abnormality.   5:00 PM Patient in ultrasound. Sign out to K. Ala Dach, [place] pending ultrasound results  Previous chart, nursing notes, and vital signs reviewed.    Pertinent labs & imaging  results that were available during my care of the patient were reviewed by me and considered in my medical decision making (see chart for details).    Wojeck, Hinton Dyer, NP 07/17/17 1145    Alvira Monday, MD 07/17/17 1336

## 2017-07-12 NOTE — Progress Notes (Signed)
*  PRELIMINARY RESULTS* Vascular Ultrasound Bilateral lower extremity venous duplex has been completed.  Preliminary findings: No evidence of deep vein thrombosis bilaterally. Right sided bakers cysts noted.   Chauncey FischerCharlotte C Juline Sanderford 07/12/2017, 4:44 PM

## 2017-07-12 NOTE — Discharge Instructions (Addendum)
You were seen in the ER for knee pain. Your xrays did not show an acute fracture. Please take Tylenol as prescribed as needed for pain--take with food to prevent GI upset. Follow RICE (rest, ice, compress, and elevate) instructions below. Call to schedule a follow up appointment with your primary care doctor for follow up in 5 days as well as Dr. Charlann Boxerlin orthopedics in 1 week. Return to the ER if you experience fevers, chills, unexplained weight loss, dizziness, headaches, neck pain, chest pain, shortness of breath, abdominal pain, nausea, vomiting, diarrhea, extremity numbness or tingling, swelling/redness/warmth to knee, increased pain, weakness, decreased sensation, calf pain/swelling, worsening symptoms, or any additional concerns.  Self-care for Knee Injury: Rest your knee. Rest helps decrease swelling and allows the injury to heal. You can do gentle range of motion (ROM) exercises as directed. This will prevent stiffness. Apply ice to your knee for 20 minutes four times a day. Use an ice pack, or put crushed ice in a plastic bag. Cover it with a towel. Do not apply ice directly to skin. Ice helps prevent tissue damage and decreases swelling and pain. Apply compression to your knee as directed. You may need to wear an elastic bandage or knee immobilizer. This helps keep your injured knee from moving too much while it heals. You can loosen or tighten the elastic bandage and knee immobilizer to make it comfortable. It should be tight enough for you to feel support. It should not be so tight that it causes your toes to be numb or tingly. If you are wearing an elastic bandage and knee immobilizer, take it off and rewrap it at least once a day. Do not wear knee immobilizer at night or for more than 1 week. Elevate your knee above the level of your heart as often as you can. This will help decrease swelling and pain. Prop your leg on pillows or blankets to keep it elevated comfortably. Do not put pillows directly  behind your knee.

## 2017-07-12 NOTE — ED Triage Notes (Signed)
Bilateral knee pain began a few months ago.  IN the past week , the pain has become worse.  His legs gave out yesterday, and went down onto his knees at work.    The pain is burning with spasms.   He has difficulty  Walking .  Pt. Alert and oriented X 4.

## 2017-07-12 NOTE — ED Provider Notes (Signed)
Care [facility] at shift change, please see her note for full details in brief [name] is a 61 year old male who presents with bilateral knee pain for a few months, worsening over the past week yesterday he fell his right knee "give out" and fell to both knees. History of left knee arthroplasty in 2013 by Dr. Charlann Boxerlin.   Today x-ray of bilateral knees is negative for acute abnormality, but given prolonged immobilization and chronicity of pain in NP Cook ordered bilateral venous duplex to rule out DVT. If these studies are negative, plan is for patient to follow-up with Dr. Charlann Boxerlin in the office.   Physical Exam  BP 107/72 (BP Location: Left Arm)   Pulse 77   Temp 97.8 Cook (36.6 C) (Oral)   Resp 20   SpO2 96%   Physical Exam  Constitutional: He appears well-developed and well-nourished. No distress.  HENT:  Head: Normocephalic and atraumatic.  Eyes: Right eye exhibits no discharge. Left eye exhibits no discharge.  Pulmonary/Chest: Effort normal. No respiratory distress.  Musculoskeletal:  + bilateral anterior knees very mildly ttp, full active ROM; no sig effusion, skin intact without erythema/warmth/ecchymosis to area. Patient ambulated with a cane in the ED without difficulty.  Neurological: He is alert. Coordination normal.  Skin: He is not diaphoretic.  Psychiatric: He has a normal mood and affect. His behavior is normal.  Nursing note and vitals reviewed.   ED Course  Procedures  Vascular Ultrasound Bilateral lower extremity venous duplex has been completed.  Preliminary findings: No evidence of deep vein thrombosis bilaterally. Right sided bakers cysts noted.   Randall FischerCharlotte C Cook 07/12/2017, 4:44 PM  MDM  Patient presents with chronic bilateral knee pain, otherwise well-appearing and in no acute distress. Bilateral knee x-ray show no acute abnormality, and bilateral DVT studies negative. DVT study does show right-sided Baker's cyst. All results were discussed  with the patient, recommend Rice therapy and Tylenol and follow-up with PCP as well as Dr. Charlann Boxerlin with orthopedics. Return precautions discussed. Patient is amenable to this plan, expresses understanding.  Please see Randall SchoolsRobyn Cook's note for full details.      Randall LodgeFord, Randall Cook N, PA-C 07/13/17 [id number]    Randall Cook, Randall F, MD 07/14/17 [phone]

## 2017-08-19 ENCOUNTER — Other Ambulatory Visit (INDEPENDENT_AMBULATORY_CARE_PROVIDER_SITE_OTHER): Payer: Self-pay | Admitting: Physician Assistant

## 2017-08-19 DIAGNOSIS — I10 Essential (primary) hypertension: Secondary | ICD-10-CM

## 2017-08-19 NOTE — Telephone Encounter (Signed)
FWD to PCP. Jax Kentner S Mads Borgmeyer, CMA  

## 2017-11-16 ENCOUNTER — Encounter (HOSPITAL_COMMUNITY): Payer: Self-pay | Admitting: Emergency Medicine

## 2017-11-16 ENCOUNTER — Emergency Department (HOSPITAL_COMMUNITY): Payer: Self-pay

## 2017-11-16 ENCOUNTER — Emergency Department (HOSPITAL_COMMUNITY)
Admission: EM | Admit: 2017-11-16 | Discharge: 2017-11-16 | Disposition: A | Discharge status: Home / Self Care | Payer: Self-pay | Attending: Physician Assistant | Admitting: Physician Assistant

## 2017-11-16 DIAGNOSIS — Z79899 Other long term (current) drug therapy: Secondary | ICD-10-CM | POA: Insufficient documentation

## 2017-11-16 DIAGNOSIS — Z96652 Presence of left artificial knee joint: Secondary | ICD-10-CM | POA: Insufficient documentation

## 2017-11-16 DIAGNOSIS — Z87891 Personal history of nicotine dependence: Secondary | ICD-10-CM | POA: Insufficient documentation

## 2017-11-16 DIAGNOSIS — L03019 Cellulitis of unspecified finger: Secondary | ICD-10-CM

## 2017-11-16 DIAGNOSIS — L03011 Cellulitis of right finger: Secondary | ICD-10-CM | POA: Insufficient documentation

## 2017-11-16 DIAGNOSIS — I10 Essential (primary) hypertension: Secondary | ICD-10-CM | POA: Insufficient documentation

## 2017-11-16 MED ORDER — CEPHALEXIN 500 MG PO CAPS: 500.0000 mg | ORAL_CAPSULE | Freq: Four times a day (QID) | ORAL | 0 refills | Status: DC

## 2017-11-16 MED ORDER — LIDOCAINE HCL (PF) 1 % IJ SOLN
30.0000 mL | Freq: Once | INTRAMUSCULAR | Status: AC
  Administered 2017-11-16: 30 mL via INTRADERMAL
  Filled 2017-11-16: qty 30

## 2017-11-16 NOTE — ED Provider Notes (Addendum)
[facility]-EMERGENCY DEPT Provider Note   CSN: [id number] Arrival date & time: 11/16/17  [id number]     History   Chief Complaint Chief Complaint  Patient presents with  . right middle finger swelling    HPI Randall Cook is a 62 y.o. male.  HPI  62 year old male presenting with 5 days of swelling to his right middle finger.  Patient reports that it started 5 days ago.  He is been using Epson salts soaks and is not been improving.  Past Medical History:  Diagnosis Date  . Arthritis   . Dysrhythmia    tachycardia- EKG,clearance 02/29/12 Dr Azucena CecilSwayne  on chart  . GERD (gastroesophageal reflux disease)   . Hyperlipidemia   . Hypertension    LOV,   Dr Azucena CecilSwayne 02/29/12 on chart-   CBC with diff, CMET 02/29/12 on chart  . Sleep apnea    STOP BANG SCORE 4    Patient Active Problem List   Diagnosis Date Noted  . HLD (hyperlipidemia) 04/09/2017  . 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 NO PAST SURGERIES    . PARTIAL KNEE ARTHROPLASTY  03/10/2012   Procedure: UNICOMPARTMENTAL KNEE;  Surgeon: Shelda PalMatthew D Olin, MD;  Location: [facility];  Service: Orthopedics;  Laterality: Left;       Home Medications    Prior to Admission medications   Medication Sig Start Date End Date Taking? Authorizing Provider  hydrochlorothiazide (HYDRODIURIL) 25 MG tablet TAKE 1 TABLET BY MOUTH ONCE DAILY IN THE MORNING 08/19/17   Loletta SpecterGomez, Roger David, PA-C  losartan (COZAAR) 50 MG tablet Take 1 tablet (50 mg total) by mouth daily. 04/11/17   Loletta SpecterGomez, Roger David, PA-C  lovastatin (MEVACOR) 20 MG tablet Take 1 tablet (20 mg total) by mouth at bedtime. 04/09/17   Loletta SpecterGomez, Roger David, PA-C  methocarbamol (ROBAXIN) 500 MG tablet Take 1 tablet (500 mg total) by mouth 2 (two) times daily as needed for muscle spasms. 03/25/17   Everlene Farrieransie, William, PA-C  predniSONE (DELTASONE) 20 MG tablet Take 1 tablet (20 mg total) by mouth daily with breakfast. 03/28/17   Loletta SpecterGomez, Roger  David, PA-C    Family History No family history on file.  Social History Social History   Tobacco Use  . Smoking status: Former Smoker    Packs/day: 0.50    Years: 20.00    Pack years: 10.00    Types: Cigarettes    Last attempt to quit: 03/05/1999    Years since quitting: 18.7  . Smokeless tobacco: Never Used  Substance Use Topics  . Alcohol use: Yes    Comment: none x 20 yrs  . Drug use: No     Allergies   Penicillins   Review of Systems Review of Systems  Constitutional: Negative for activity change.  Respiratory: Negative for shortness of breath.   Cardiovascular: Negative for chest pain.  Gastrointestinal: Negative for abdominal pain.  All other systems reviewed and are negative.    Physical Exam Updated Vital Signs BP (!) 141/102 (BP Location: Left Arm)   Pulse 78   Temp 98.6 F (37 C) (Oral)   Resp 15   SpO2 94%   Physical Exam  Constitutional: He is oriented to person, place, and time. He appears well-nourished.  HENT:  Head: Normocephalic.  Eyes: Conjunctivae are normal.  Cardiovascular: Normal rate.  Pulmonary/Chest: Effort normal.  Musculoskeletal:  R middle finger pics below. Mild tenderness but no fluctuance, no erythema.  Neurological: He is oriented to person, place, and time.  Skin: Skin is warm and dry. He is not diaphoretic.  Psychiatric: He has a normal mood and affect. His behavior is normal.         ED Treatments / Results  Labs (all labs ordered are listed, but only abnormal results are displayed) Labs Reviewed - No data to display  EKG  EKG Interpretation None       Radiology No results found.  Procedures .Marland KitchenIncision and Drainage Date/Time: 11/16/2017 11:58 AM Performed by: Abelino Derrick, MD Authorized by: Abelino Derrick, MD   Consent:    Consent obtained:  Verbal Location:    Type:  Abscess Procedure type:    Complexity:  Simple Procedure details:    Incision types:  Stab incision    Scalpel blade:  10   Drainage:  Serosanguinous   Drainage amount:  Scant   Wound treatment:  Wound left open   Packing materials:  None Post-procedure details:    Patient tolerance of procedure:  Tolerated well, no immediate complications   (including critical care time)  Medications Ordered in ED Medications  lidocaine (PF) (XYLOCAINE) 1 % injection 30 mL (not administered)     Initial Impression / Assessment and Plan / ED Course  I have reviewed the triage vital signs and the nursing notes.  Pertinent labs & imaging results that were available during my care of the patient were reviewed by me and considered in my medical decision making (see chart for details).       62 year old male presenting with 5 days of swelling to his right middle finger.  Patient reports that it started 5 days ago.  He is been using Epson salts soaks and is not been improving.  Patient had pain and swelling in that area that was atypical for him.  Concern for a felon.  No real fluctuance.  Or erythema.  But given the pain, concern for underlying infection.  Made a small incision in the lateral aspect in order to promote drainage.  We will also give him antibiotics.  Home follow-up with any increased swelling, signs of infection, or other concerns.  Final Clinical Impressions(s) / ED Diagnoses   Final diagnoses:  None    ED Discharge Orders    None       Abelino Derrick, MD 11/16/17 1201    Abelino Derrick, MD 11/16/17 1202

## 2017-11-16 NOTE — Discharge Instructions (Signed)
We think that you likely had what is called a felon.  Which is an infection in the soft tissue in your fingertip.  We have put a small incision there is no drains.  Please use those Epson salt soaks 3 times a day and keep it clean and dry otherwise.  Please return with any increased redness or other signs of infection.

## 2017-11-16 NOTE — ED Triage Notes (Signed)
Patient reports that noticed Monday morning when he woke up that right middle finger was swollen and painful. Tried soaking in peroxide and epsom salt but still has swelling and painful with touch. Denies any drainage.

## 2017-12-19 ENCOUNTER — Emergency Department (HOSPITAL_COMMUNITY): Payer: Self-pay

## 2017-12-19 ENCOUNTER — Encounter (HOSPITAL_COMMUNITY): Payer: Self-pay | Admitting: Emergency Medicine

## 2017-12-19 ENCOUNTER — Emergency Department (HOSPITAL_COMMUNITY)
Admission: EM | Admit: 2017-12-19 | Discharge: 2017-12-19 | Disposition: A | Discharge status: Home / Self Care | Payer: Self-pay | Attending: Emergency Medicine | Admitting: Emergency Medicine

## 2017-12-19 DIAGNOSIS — Z79899 Other long term (current) drug therapy: Secondary | ICD-10-CM | POA: Insufficient documentation

## 2017-12-19 DIAGNOSIS — I10 Essential (primary) hypertension: Secondary | ICD-10-CM | POA: Insufficient documentation

## 2017-12-19 DIAGNOSIS — M542 Cervicalgia: Secondary | ICD-10-CM | POA: Insufficient documentation

## 2017-12-19 DIAGNOSIS — M545 Low back pain: Secondary | ICD-10-CM | POA: Insufficient documentation

## 2017-12-19 DIAGNOSIS — Z87891 Personal history of nicotine dependence: Secondary | ICD-10-CM | POA: Insufficient documentation

## 2017-12-19 MED ORDER — METHOCARBAMOL 500 MG PO TABS: 500.0000 mg | ORAL_TABLET | Freq: Two times a day (BID) | ORAL | 0 refills | Status: DC | PRN

## 2017-12-19 MED ORDER — OXYCODONE-ACETAMINOPHEN 5-325 MG PO TABS
1.0000 | ORAL_TABLET | Freq: Once | ORAL | Status: AC
  Administered 2017-12-19: 1 via ORAL
  Filled 2017-12-19: qty 1

## 2017-12-19 MED ORDER — IBUPROFEN 600 MG PO TABS: 600.0000 mg | ORAL_TABLET | Freq: Four times a day (QID) | ORAL | 0 refills | Status: DC | PRN
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
## 2017-12-19 NOTE — ED Notes (Signed)
Pt ambulatory at discharge.  

## 2017-12-19 NOTE — ED Notes (Signed)
Patient transported to X-ray 

## 2017-12-23 ENCOUNTER — Other Ambulatory Visit: Payer: Self-pay

## 2017-12-23 ENCOUNTER — Ambulatory Visit (HOSPITAL_COMMUNITY)
Admission: EM | Admit: 2017-12-23 | Discharge: 2017-12-23 | Disposition: A | Discharge status: Home / Self Care | Payer: Self-pay | Attending: Urgent Care | Admitting: Urgent Care

## 2017-12-23 ENCOUNTER — Encounter (HOSPITAL_COMMUNITY): Payer: Self-pay

## 2017-12-23 DIAGNOSIS — M542 Cervicalgia: Secondary | ICD-10-CM

## 2017-12-23 MED ORDER — MELOXICAM 7.5 MG PO TABS: 7.5000 mg | ORAL_TABLET | Freq: Every day | ORAL | 0 refills | Status: DC

## 2017-12-23 NOTE — Discharge Instructions (Signed)
Hydrate well with at least 2 liters (1 gallon) of water daily.  °
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
## 2017-12-26 ENCOUNTER — Other Ambulatory Visit: Payer: Self-pay

## 2017-12-26 ENCOUNTER — Encounter (HOSPITAL_COMMUNITY): Payer: Self-pay | Admitting: Emergency Medicine

## 2017-12-26 ENCOUNTER — Emergency Department (HOSPITAL_COMMUNITY)
Admission: EM | Admit: 2017-12-26 | Discharge: 2017-12-26 | Disposition: A | Discharge status: Home / Self Care | Payer: Self-pay | Attending: Emergency Medicine | Admitting: Emergency Medicine

## 2017-12-26 DIAGNOSIS — Z79899 Other long term (current) drug therapy: Secondary | ICD-10-CM | POA: Insufficient documentation

## 2017-12-26 DIAGNOSIS — R1013 Epigastric pain: Secondary | ICD-10-CM | POA: Insufficient documentation

## 2017-12-26 DIAGNOSIS — R112 Nausea with vomiting, unspecified: Secondary | ICD-10-CM | POA: Insufficient documentation

## 2017-12-26 DIAGNOSIS — R197 Diarrhea, unspecified: Secondary | ICD-10-CM | POA: Insufficient documentation

## 2017-12-26 DIAGNOSIS — I10 Essential (primary) hypertension: Secondary | ICD-10-CM | POA: Insufficient documentation

## 2017-12-26 DIAGNOSIS — R42 Dizziness and giddiness: Secondary | ICD-10-CM | POA: Insufficient documentation

## 2017-12-26 DIAGNOSIS — Z87891 Personal history of nicotine dependence: Secondary | ICD-10-CM | POA: Insufficient documentation

## 2017-12-26 LAB — CBC WITH DIFFERENTIAL/PLATELET
BASOS PCT: 0 %
Basophils Absolute: 0 10*3/uL (ref 0.0–0.1)
EOS ABS: 0 10*3/uL (ref 0.0–0.7)
Eosinophils Relative: 0 %
HCT: 43.3 % (ref 39.0–52.0)
Hemoglobin: 14.9 g/dL (ref 13.0–17.0)
LYMPHS ABS: 0.4 10*3/uL — AB (ref 0.7–4.0)
Lymphocytes Relative: 4 %
MCH: 31.5 pg (ref 26.0–34.0)
MCHC: 34.4 g/dL (ref 30.0–36.0)
MCV: 91.5 fL (ref 78.0–100.0)
Monocytes Absolute: 0.6 10*3/uL (ref 0.1–1.0)
Monocytes Relative: 7 %
Neutro Abs: 7.9 10*3/uL — ABNORMAL HIGH (ref 1.7–7.7)
Neutrophils Relative %: 89 %
PLATELETS: 250 10*3/uL (ref 150–400)
RBC: 4.73 MIL/uL (ref 4.22–5.81)
RDW: 13.1 % (ref 11.5–15.5)
WBC: 8.9 10*3/uL (ref 4.0–10.5)

## 2017-12-26 LAB — I-STAT CHEM 8, ED
BUN: 16 mg/dL (ref 6–20)
CREATININE: 1.1 mg/dL (ref 0.61–1.24)
Calcium, Ion: 1.2 mmol/L (ref 1.15–1.40)
Chloride: 105 mmol/L (ref 101–111)
GLUCOSE: 104 mg/dL — AB (ref 65–99)
HEMATOCRIT: 46 % (ref 39.0–52.0)
HEMOGLOBIN: 15.6 g/dL (ref 13.0–17.0)
Potassium: 3.9 mmol/L (ref 3.5–5.1)
Sodium: 141 mmol/L (ref 135–145)
TCO2: 26 mmol/L (ref 22–32)

## 2017-12-26 LAB — I-STAT TROPONIN, ED: TROPONIN I, POC: 0 ng/mL (ref 0.00–0.08)

## 2017-12-26 MED ORDER — SODIUM CHLORIDE 0.9 % IV BOLUS
1000.0000 mL | Freq: Once | INTRAVENOUS | Status: AC
  Administered 2017-12-26: 1000 mL via INTRAVENOUS

## 2017-12-26 MED ORDER — ONDANSETRON HCL 4 MG/2ML IJ SOLN
4.0000 mg | Freq: Once | INTRAMUSCULAR | Status: AC
  Administered 2017-12-26: 4 mg via INTRAVENOUS
  Filled 2017-12-26: qty 2

## 2017-12-26 MED ORDER — MORPHINE SULFATE (PF) 4 MG/ML IV SOLN
4.0000 mg | Freq: Once | INTRAVENOUS | Status: AC
  Administered 2017-12-26: 4 mg via INTRAVENOUS
  Filled 2017-12-26: qty 1

## 2017-12-26 MED ORDER — ONDANSETRON 4 MG PO TBDP: 4.0000 mg | ORAL_TABLET | Freq: Three times a day (TID) | ORAL | 0 refills | Status: DC | PRN

## 2017-12-26 NOTE — ED Notes (Signed)
Pt was given gingerale. Tolerated well.
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
## 2017-12-26 NOTE — ED Triage Notes (Signed)
Pt arriving with complaint of chest pain and abdominal pain that began last night. Pt reports he is also having numbness all over.

## 2018-01-07 ENCOUNTER — Ambulatory Visit (INDEPENDENT_AMBULATORY_CARE_PROVIDER_SITE_OTHER): Payer: PRIVATE HEALTH INSURANCE | Admitting: Nurse Practitioner

## 2018-01-07 ENCOUNTER — Other Ambulatory Visit (INDEPENDENT_AMBULATORY_CARE_PROVIDER_SITE_OTHER): Payer: Self-pay | Admitting: Physician Assistant

## 2018-01-07 ENCOUNTER — Encounter (INDEPENDENT_AMBULATORY_CARE_PROVIDER_SITE_OTHER): Payer: Self-pay | Admitting: Nurse Practitioner

## 2018-01-07 ENCOUNTER — Other Ambulatory Visit: Payer: Self-pay

## 2018-01-07 VITALS — BP 121/76 | HR 63 | Temp 97.7°F | Ht 73.0 in | Wt 251.8 lb

## 2018-01-07 DIAGNOSIS — I10 Essential (primary) hypertension: Secondary | ICD-10-CM

## 2018-01-07 DIAGNOSIS — M542 Cervicalgia: Secondary | ICD-10-CM | POA: Diagnosis not present

## 2018-01-07 DIAGNOSIS — E785 Hyperlipidemia, unspecified: Secondary | ICD-10-CM

## 2018-01-07 MED ORDER — MELOXICAM 15 MG PO TABS: 15.0000 mg | ORAL_TABLET | Freq: Every day | ORAL | 0 refills | Status: AC

## 2018-01-07 MED ORDER — LOSARTAN POTASSIUM 50 MG PO TABS: 50.0000 mg | ORAL_TABLET | Freq: Every day | ORAL | 1 refills | Status: DC

## 2018-01-07 MED ORDER — METHOCARBAMOL 500 MG PO TABS: 500.0000 mg | ORAL_TABLET | Freq: Two times a day (BID) | ORAL | 0 refills | Status: DC | PRN

## 2018-01-07 MED ORDER — LOVASTATIN 20 MG PO TABS: 20.0000 mg | ORAL_TABLET | Freq: Every day | ORAL | 3 refills | Status: DC

## 2018-01-07 NOTE — Patient Instructions (Addendum)
You can use your home TENS therapy and alternate with a heating pad on your neck and shoulders Methocarbamol at night and meloxicam during the day for pain relief.   Musculoskeletal Pain Musculoskeletal pain is muscle and bone aches and pains. This pain can occur in any part of the body. Follow these instructions at home:  Only take medicines for pain, discomfort, or fever as told by your health care provider.  You may continue all activities unless the activities cause more pain. When the pain lessens, slowly resume normal activities. Gradually increase the intensity and duration of the activities or exercise.  During periods of severe pain, bed rest may be helpful. Lie or sit in any position that is comfortable, but get out of bed and walk around at least every several hours.  If directed, put ice on the injured area. ? Put ice in a plastic bag. ? Place a towel between your skin and the bag. ? Leave the ice on for 20 minutes, 2-3 times a day. Contact a health care provider if:  Your pain is getting worse.  Your pain is not relieved with medicines.  You lose function in the area of the pain if the pain is in your arms, legs, or neck. This information is not intended to replace advice given to you by your health care provider. Make sure you discuss any questions you have with your health care provider. Document Released: 09/10/2005 Document Revised: 02/21/2016 Document Reviewed: 05/15/2013 Elsevier Interactive Patient Education  2017 [employer]  Muscle Strain A muscle strain is an injury that occurs when a muscle is stretched beyond its normal length. Usually a small number of muscle fibers are torn when this happens. Muscle strain is rated in degrees. First-degree strains have the least amount of muscle fiber tearing and pain. Second-degree and third-degree strains have increasingly more tearing and pain. Usually, recovery from muscle strain takes 1-2 weeks. Complete healing takes  5-6 weeks. What are the causes? Muscle strain happens when a sudden, violent force placed on a muscle stretches it too far. This may occur with lifting, sports, or a fall. What increases the risk? Muscle strain is especially common in athletes. What are the signs or symptoms? At the site of the muscle strain, there may be:  Pain.  Bruising.  Swelling.  Difficulty using the muscle due to pain or lack of normal function.  How is this diagnosed? Your health care provider will perform a physical exam and ask about your medical history. How is this treated? Often, the best treatment for a muscle strain is resting, icing, and applying cold compresses to the injured area. Follow these instructions at home:  Use the PRICE method of treatment to promote muscle healing during the first 2-3 days after your injury. The PRICE method involves: ? Protecting the muscle from being injured again. ? Restricting your activity and resting the injured body part. ? Icing your injury. To do this, put ice in a plastic bag. Place a towel between your skin and the bag. Then, apply the ice and leave it on from 15-20 minutes each hour. After the third day, switch to moist heat packs. ? Apply compression to the injured area with a splint or elastic bandage. Be careful not to wrap it too tightly. This may interfere with blood circulation or increase swelling. ? Elevate the injured body part above the level of your heart as often as you can.  Only take over-the-counter or prescription medicines for pain, discomfort,  or fever as directed by your health care provider.  Warming up prior to exercise helps to prevent future muscle strains. Contact a health care provider if:  You have increasing pain or swelling in the injured area.  You have numbness, tingling, or a significant loss of strength in the injured area. This information is not intended to replace advice given to you by your health care provider. Make  sure you discuss any questions you have with your health care provider. Document Released: 09/10/2005 Document Revised: 02/16/2016 Document Reviewed: 04/09/2013 Elsevier Interactive Patient Education  2017 [employer].
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
## 2018-03-10 ENCOUNTER — Ambulatory Visit (INDEPENDENT_AMBULATORY_CARE_PROVIDER_SITE_OTHER): Payer: PRIVATE HEALTH INSURANCE | Admitting: Physician Assistant

## 2018-07-14 IMAGING — CR DG ELBOW COMPLETE 3+V*L*
5 series · 5 of 5 positions shown · non-contrast
Comparison: None.

CLINICAL DATA: Olecranon pain and swelling after hitting funny bone
1 month ago.

EXAM:
LEFT ELBOW - COMPLETE 3+ VIEW

[x elbow ap left]
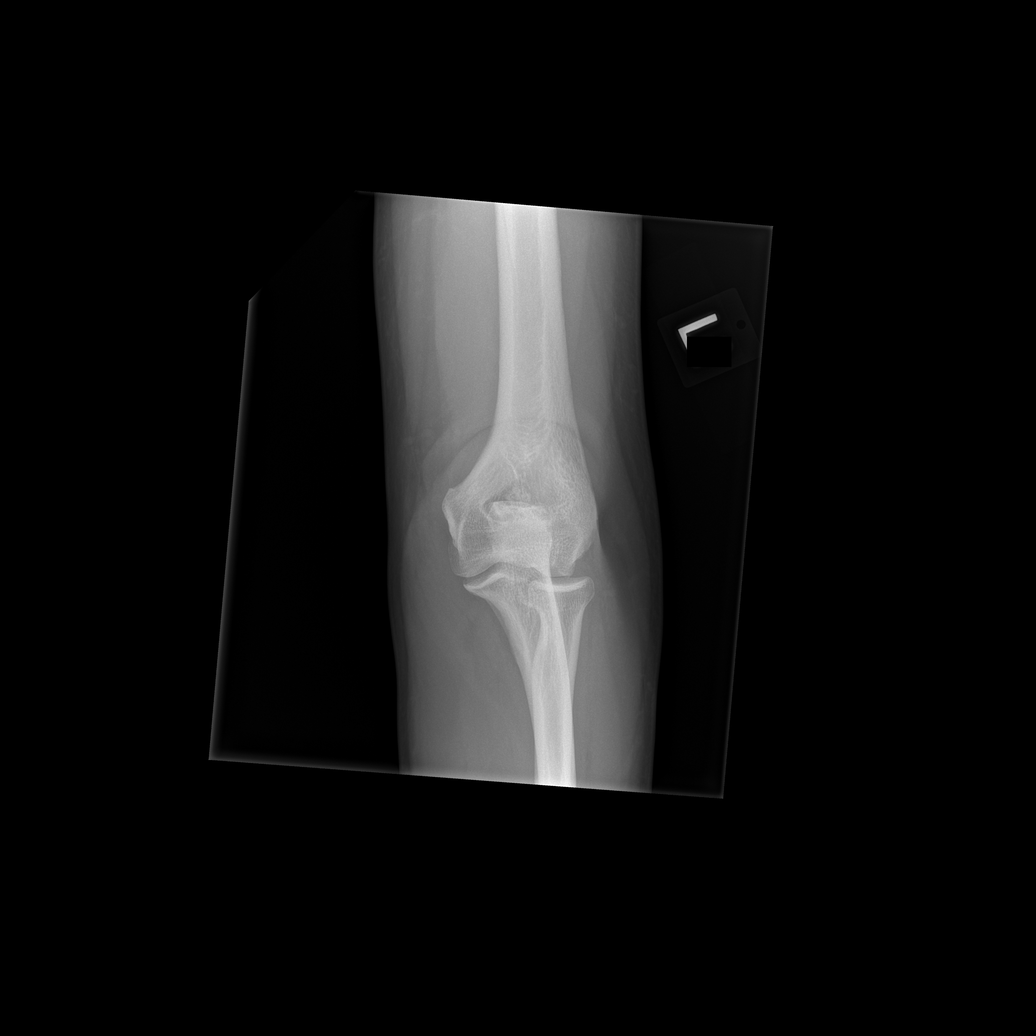

[x elbow obl left (1 of 2)]
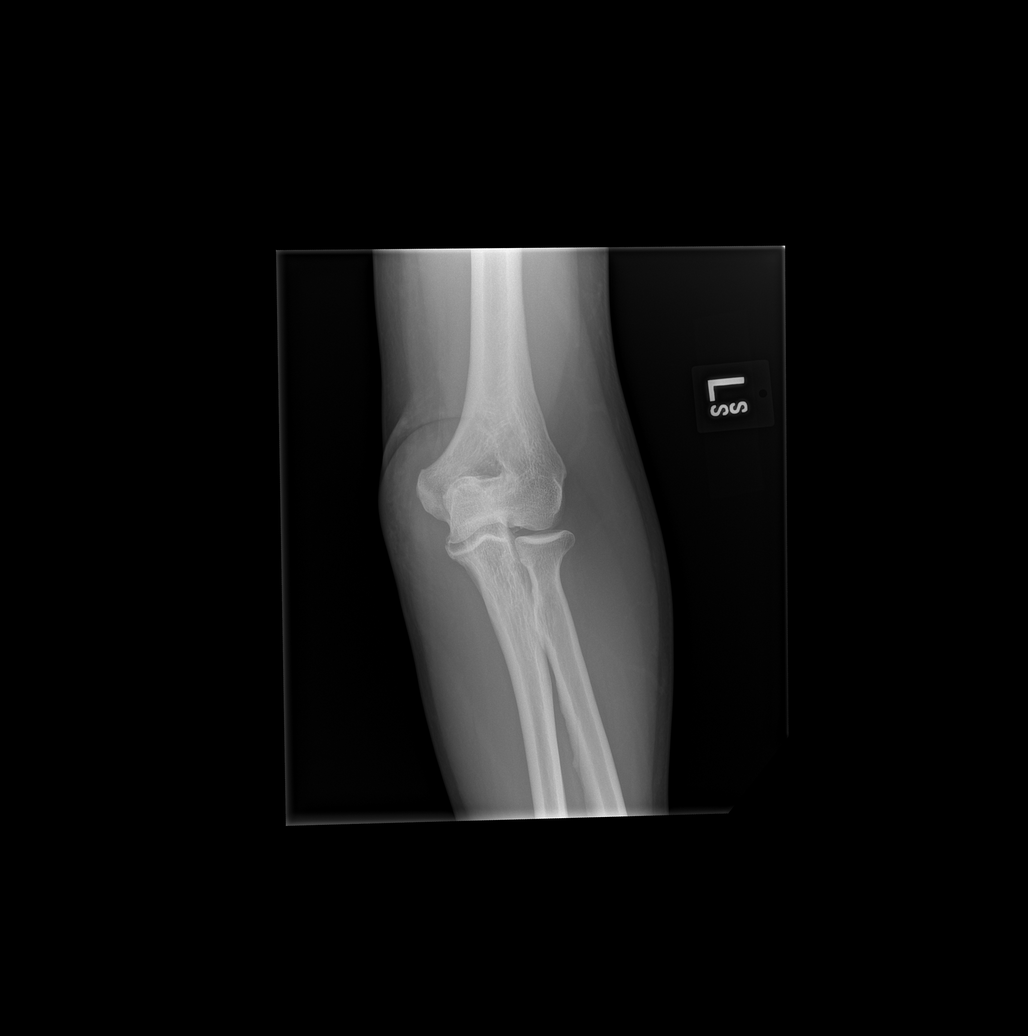

[x elbow obl left (2 of 2)]
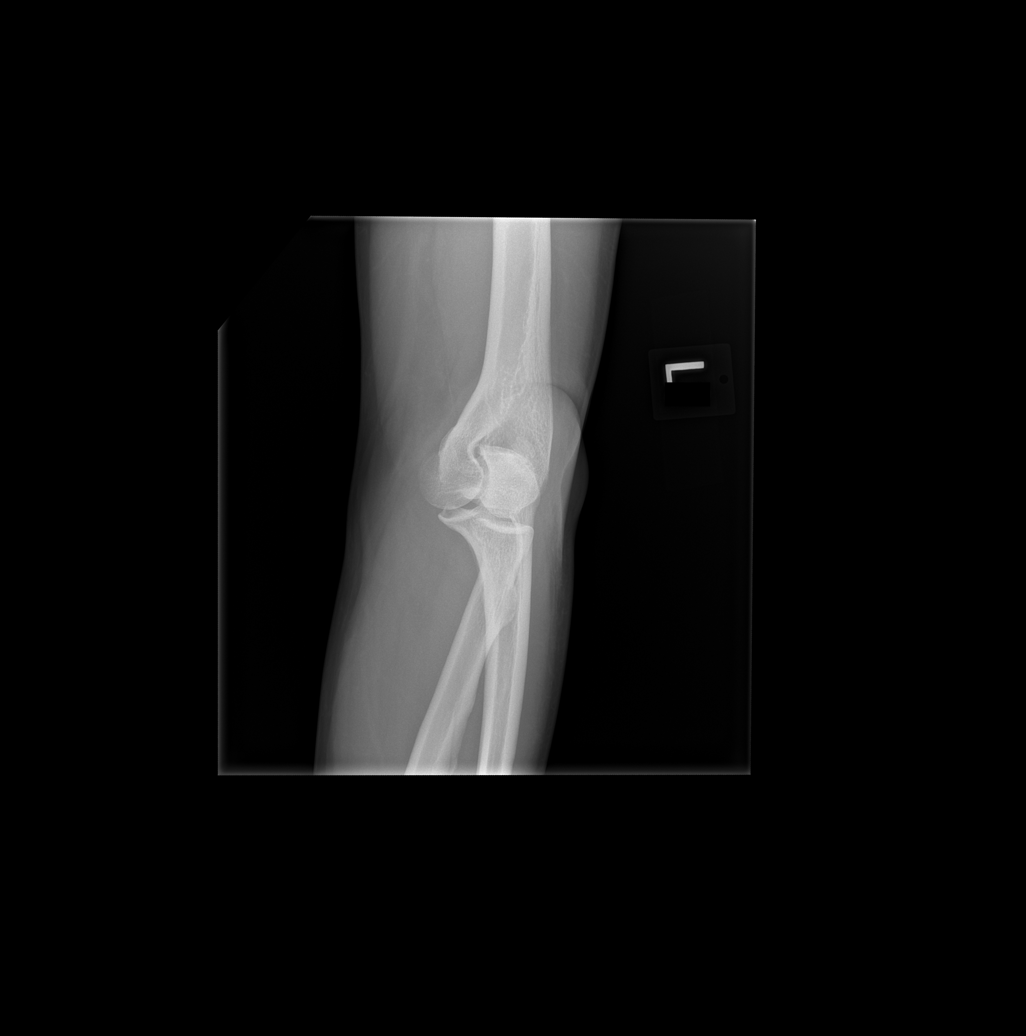

[x elbow lat left (1 of 2)]
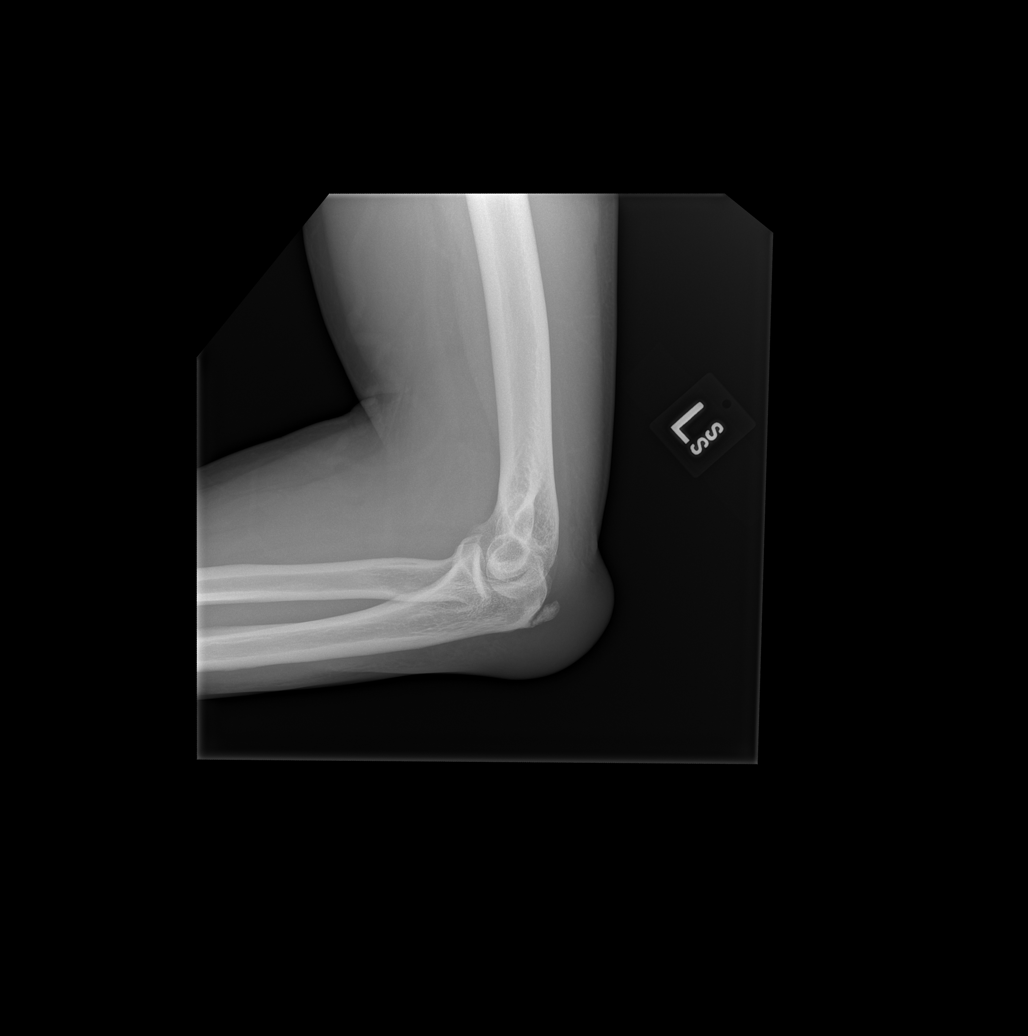

[x elbow lat left (2 of 2)]
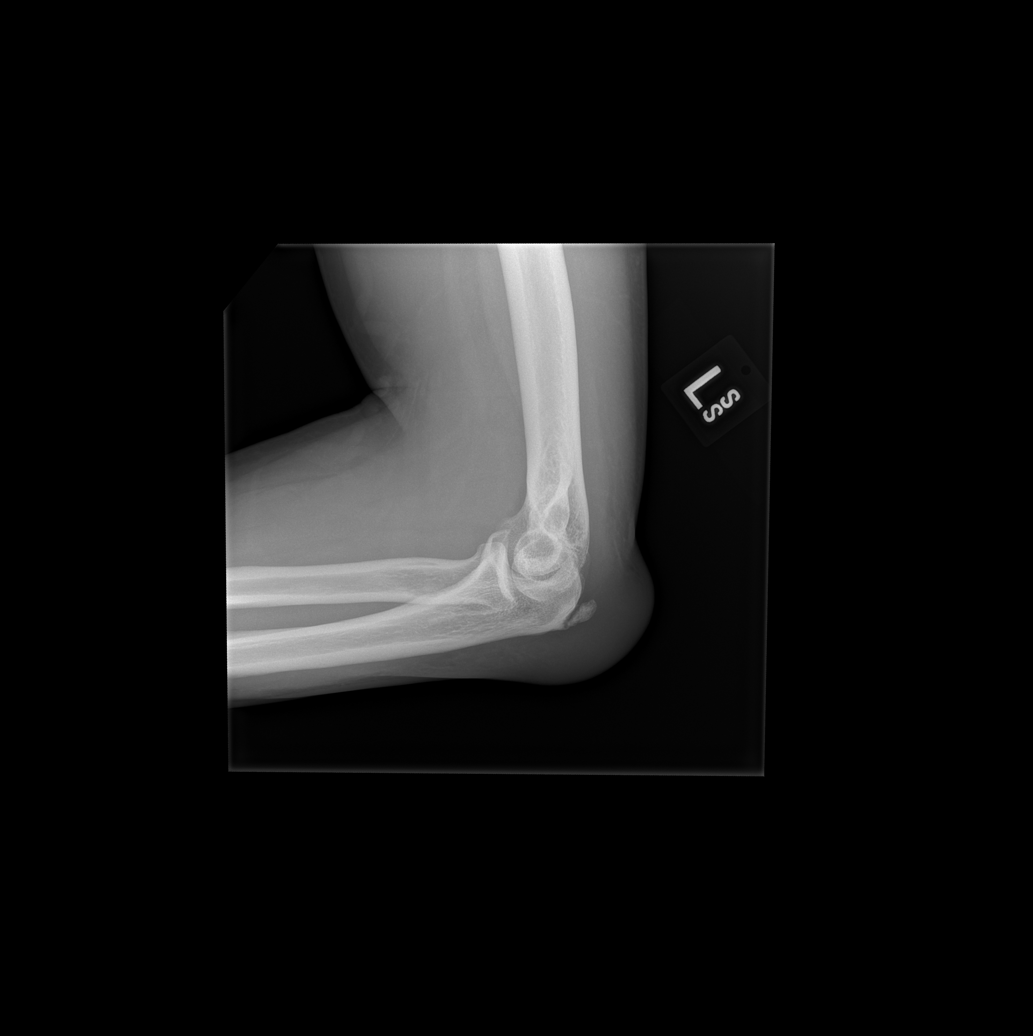

[5 of 5 positions shown; findings below may reference images not displayed]

FINDINGS: No acute fracture deformity or dislocation. Olecranon at
enthesopathy at triceps insertion. Overlying soft tissue swelling
without subcutaneous gas radiopaque foreign bodies. No destructive
bony lesions.
IMPRESSION: Olecranon soft tissue swelling compatible with bursitis which could
be posttraumatic or possibly secondary to calcific tendinopathy.

No acute fracture deformity nor dislocation.

## 2018-11-24 ENCOUNTER — Ambulatory Visit (INDEPENDENT_AMBULATORY_CARE_PROVIDER_SITE_OTHER): Payer: PRIVATE HEALTH INSURANCE | Admitting: Primary Care

## 2018-11-24 ENCOUNTER — Encounter (INDEPENDENT_AMBULATORY_CARE_PROVIDER_SITE_OTHER): Payer: Self-pay | Admitting: Primary Care

## 2018-11-24 ENCOUNTER — Other Ambulatory Visit: Payer: Self-pay

## 2018-11-24 VITALS — BP 150/84 | HR 119 | Temp 97.7°F | Ht 73.0 in | Wt 238.6 lb

## 2018-11-24 DIAGNOSIS — M25561 Pain in right knee: Secondary | ICD-10-CM

## 2018-11-24 DIAGNOSIS — M25519 Pain in unspecified shoulder: Secondary | ICD-10-CM

## 2018-11-24 DIAGNOSIS — M25562 Pain in left knee: Secondary | ICD-10-CM

## 2018-11-24 DIAGNOSIS — G8929 Other chronic pain: Secondary | ICD-10-CM | POA: Diagnosis not present

## 2018-11-24 DIAGNOSIS — I10 Essential (primary) hypertension: Secondary | ICD-10-CM | POA: Diagnosis not present

## 2018-11-24 MED ORDER — IBUPROFEN 600 MG PO TABS: 600.0000 mg | ORAL_TABLET | Freq: Three times a day (TID) | ORAL | 0 refills | Status: DC | PRN

## 2018-11-24 MED ORDER — HYDROCHLOROTHIAZIDE 25 MG PO TABS: ORAL_TABLET | ORAL | 1 refills | Status: DC

## 2018-11-24 MED ORDER — LOSARTAN POTASSIUM 50 MG PO TABS: 50.0000 mg | ORAL_TABLET | Freq: Every day | ORAL | 1 refills | Status: DC

## 2018-11-24 NOTE — Progress Notes (Signed)
Established Patient Office Visit  Subjective:  Patient ID: Randall Cook, male    DOB: 1956-06-14  Age: 63 y.o. MRN: [id number]  CC:  Chief Complaint  Patient presents with  . Hypertension  . Pain    knee shoulder and leg     HPI Randall Cook presents for chronic pain in his right knee and bilateral shoulder pain. PMH of back pain. He has previously establish care with ortho Dr. Ferd Hibbs and will refer him back to him.  Past Medical History:  Diagnosis Date  . Arthritis   . Dysrhythmia    tachycardia- EKG,clearance 02/29/12 Dr Azucena Cecil  on chart  . GERD (gastroesophageal reflux disease)   . Hyperlipidemia   . Hypertension    LOV,   Dr Azucena Cecil 02/29/12 on chart-   CBC with diff, CMET 02/29/12 on chart  . Sleep apnea    STOP BANG SCORE 4    Past Surgical History:  Procedure Laterality Date  . NO PAST SURGERIES    . PARTIAL KNEE ARTHROPLASTY  03/10/2012   Procedure: UNICOMPARTMENTAL KNEE;  Surgeon: [name], MD;  Location: [facility];  Service: Orthopedics;  Laterality: Left;    History reviewed. No pertinent family history.  Social History   Socioeconomic History  . Marital status: Married    Spouse name: Not on file  . Number of children: Not on file  . Years of education: Not on file  . Highest education level: Not on file  Occupational History  . Not on file  Social Needs  . Financial resource strain: Not on file  . Food insecurity:    Worry: Not on file    Inability: Not on file  . Transportation needs:    Medical: Not on file    Non-medical: Not on file  Tobacco Use  . Smoking status: Former Smoker    Packs/day: 0.50    Years: 20.00    Pack years: 10.00    Types: Cigarettes    Last attempt to quit: 03/05/1999    Years since quitting: 19.7  . Smokeless tobacco: Never Used  Substance and Sexual Activity  . Alcohol use: Yes    Comment: none x 20 yrs  . Drug use: No  . Sexual activity: Not on file  Lifestyle  . Physical activity:    Days per week: Not  on file    Minutes per session: Not on file  . Stress: Not on file  Relationships  . Social connections:    Talks on phone: Not on file    Gets together: Not on file    Attends religious service: Not on file    Active member of club or organization: Not on file    Attends meetings of clubs or organizations: Not on file    Relationship status: Not on file  . Intimate partner violence:    Fear of current or ex partner: Not on file    Emotionally abused: Not on file    Physically abused: Not on file    Forced sexual activity: Not on file  Other Topics Concern  . Not on file  Social History Narrative  . Not on file    Outpatient Medications Prior to Visit  Medication Sig Dispense Refill  . hydrochlorothiazide (HYDRODIURIL) 25 MG tablet TAKE 1 TABLET BY MOUTH ONCE DAILY IN THE MORNING 90 tablet 1  . losartan (COZAAR) 50 MG tablet Take 1 tablet (50 mg total) by mouth daily. 90 tablet 1  .  lovastatin (MEVACOR) 20 MG tablet Take 1 tablet (20 mg total) by mouth at bedtime. (Patient not taking: Reported on 11/24/2018) 90 tablet 3  . methocarbamol (ROBAXIN) 500 MG tablet Take 1 tablet (500 mg total) by mouth 2 (two) times daily as needed for muscle spasms. 30 tablet 0  . ondansetron (ZOFRAN ODT) 4 MG disintegrating tablet Take 1 tablet (4 mg total) by mouth every 8 (eight) hours as needed for nausea or vomiting. 10 tablet 0   No facility-administered medications prior to visit.     Allergies  Allergen Reactions  . Penicillins Rash    Has patient had a PCN reaction causing immediate rash, facial/tongue/throat swelling, SOB or lightheadedness with hypotension: Unknown Has patient had a PCN reaction causing severe rash involving mucus membranes or skin necrosis: No  Has patient had a PCN reaction that required hospitalization: No  Has patient had a PCN reaction occurring within the last 10 years: Yes  If all of the above answers are "NO", then may proceed with Cephalosporin use.      ROS Review of Systems  Constitutional: Negative.   HENT: Positive for dental problem.   Eyes: Negative.   Respiratory: Negative.   Cardiovascular: Positive for leg swelling.  Gastrointestinal: Positive for abdominal distention.  Endocrine: Negative.   Genitourinary: Negative.   Musculoskeletal: Positive for back pain, neck pain and neck stiffness.  Skin: Negative.   Allergic/Immunologic: Negative.   Neurological: Negative.   Hematological: Negative.   Psychiatric/Behavioral: Negative.       Objective:    Physical Exam  Constitutional: He is oriented to person, place, and time. He appears well-developed and well-nourished.  HENT:  Head: Normocephalic.  Eyes: Pupils are equal, round, and reactive to light. EOM are normal.  Neck: Normal range of motion. Neck supple.  Cardiovascular: Normal rate and regular rhythm.  Pulmonary/Chest: Breath sounds normal.  Abdominal: Soft.  Musculoskeletal: Normal range of motion.  Neurological: He is alert and oriented to person, place, and time.  Skin: Skin is warm.  Psychiatric: He has a normal mood and affect.    BP (!) 150/84 (BP Location: Left Arm, Patient Position: Sitting, Cuff Size: Large)   Pulse (!) 119   Temp 97.7 F (36.5 C) (Oral)   Ht 6\' 1"  (1.854 m)   Wt 238 lb 9.6 oz (108.2 kg)   SpO2 91%   BMI 31.48 kg/m  Wt Readings from Last 3 Encounters:  11/24/18 238 lb 9.6 oz (108.2 kg)  01/07/18 251 lb 12.8 oz (114.2 kg)  04/11/17 255 lb 9.6 oz (115.9 kg)     Health Maintenance Due  Topic Date Due  . Hepatitis C Screening  Jul 11, 1956  . HIV Screening  01/17/1972  . TETANUS/TDAP  01/17/1976  . COLONOSCOPY  01/17/2007    There are no preventive care reminders to display for this patient.  No results found for: TSH Lab Results  Component Value Date   WBC 8.9 12/26/2017   HGB 15.6 12/26/2017   HCT 46.0 12/26/2017   MCV 91.5 12/26/2017   PLT 250 12/26/2017   Lab Results  Component Value Date   NA 141  12/26/2017   K 3.9 12/26/2017   CO2 23 04/11/2017   GLUCOSE 104 (H) 12/26/2017   BUN 16 12/26/2017   CREATININE 1.10 12/26/2017   BILITOT 0.6 04/08/2017   ALKPHOS 82 04/08/2017   AST 26 04/08/2017   ALT 19 04/08/2017   PROT 7.1 04/08/2017   ALBUMIN 4.3 04/08/2017   CALCIUM 9.3 04/11/2017  ANIONGAP 8 01/26/2016   Lab Results  Component Value Date   CHOL 218 (H) 04/08/2017   Lab Results  Component Value Date   HDL 59 04/08/2017   Lab Results  Component Value Date   LDLCALC 134 (H) 04/08/2017   Lab Results  Component Value Date   TRIG 123 04/08/2017   Lab Results  Component Value Date   CHOLHDL 3.7 04/08/2017   No results found for: HGBA1C    Assessment & Plan:  1. Essential hypertension Out of medication refilled Bp meds and rtn in 2 weeks for Bp ck.  2. Chronic pain of both knees Past surgery of partial arthroplasty left lateral  Refer to ortho   Problem List Items Addressed This Visit    Hypertension - Primary   Relevant Medications   hydrochlorothiazide (HYDRODIURIL) 25 MG tablet   losartan (COZAAR) 50 MG tablet    Other Visit Diagnoses    Chronic pain of both knees       Relevant Medications   ibuprofen (ADVIL,MOTRIN) 600 MG tablet   Other Relevant Orders   Ambulatory referral to Orthopedic Surgery   Arthralgia of shoulder, unspecified laterality       Relevant Orders   Ambulatory referral to Orthopedic Surgery      Meds ordered this encounter  Medications  . ibuprofen (ADVIL,MOTRIN) 600 MG tablet    Sig: Take 1 tablet (600 mg total) by mouth every 8 (eight) hours as needed.    Dispense:  30 tablet    Refill:  0  . hydrochlorothiazide (HYDRODIURIL) 25 MG tablet    Sig: TAKE 1 TABLET BY MOUTH ONCE DAILY IN THE MORNING    Dispense:  90 tablet    Refill:  1  . losartan (COZAAR) 50 MG tablet    Sig: Take 1 tablet (50 mg total) by mouth daily.    Dispense:  90 tablet    Refill:  1    Follow-up: Return in about 2 weeks (around 12/08/2018)  for follow up on HTN /Bp ck.    Grayce Sessions, NP

## 2018-11-24 NOTE — Patient Instructions (Signed)

## 2018-12-08 ENCOUNTER — Ambulatory Visit (INDEPENDENT_AMBULATORY_CARE_PROVIDER_SITE_OTHER): Payer: PRIVATE HEALTH INSURANCE

## 2019-09-25 ENCOUNTER — Other Ambulatory Visit: Payer: Self-pay

## 2019-09-25 ENCOUNTER — Encounter (HOSPITAL_COMMUNITY): Payer: Self-pay | Admitting: Emergency Medicine

## 2019-09-25 ENCOUNTER — Emergency Department (HOSPITAL_COMMUNITY): Payer: PRIVATE HEALTH INSURANCE

## 2019-09-25 ENCOUNTER — Emergency Department (HOSPITAL_COMMUNITY)
Admission: EM | Admit: 2019-09-25 | Discharge: 2019-09-26 | Disposition: A | Discharge status: Home / Self Care | Payer: PRIVATE HEALTH INSURANCE | Attending: Emergency Medicine | Admitting: Emergency Medicine

## 2019-09-25 DIAGNOSIS — U071 COVID-19: Secondary | ICD-10-CM | POA: Insufficient documentation

## 2019-09-25 DIAGNOSIS — Z7982 Long term (current) use of aspirin: Secondary | ICD-10-CM | POA: Insufficient documentation

## 2019-09-25 DIAGNOSIS — I10 Essential (primary) hypertension: Secondary | ICD-10-CM | POA: Insufficient documentation

## 2019-09-25 DIAGNOSIS — Z87891 Personal history of nicotine dependence: Secondary | ICD-10-CM | POA: Insufficient documentation

## 2019-09-25 LAB — POC SARS CORONAVIRUS 2 AG -  ED: SARS Coronavirus 2 Ag: POSITIVE — AB

## 2019-09-25 MED ORDER — ALUM & MAG HYDROXIDE-SIMETH 200-200-20 MG/5ML PO SUSP
30.0000 mL | Freq: Once | ORAL | Status: AC
  Administered 2019-09-26: 30 mL via ORAL
  Filled 2019-09-25: qty 30

## 2019-09-25 MED ORDER — ACETAMINOPHEN 500 MG PO TABS
1000.0000 mg | ORAL_TABLET | Freq: Once | ORAL | Status: AC
  Administered 2019-09-25: 1000 mg via ORAL
  Filled 2019-09-25: qty 2

## 2019-09-25 NOTE — ED Triage Notes (Signed)
Per pt, states he is having central chest pain-pain after he eats-states right knee hurts-has been around 2 people from church who tested positive for covid

## 2019-09-25 NOTE — ED Provider Notes (Signed)
[facility] DEPT Provider Note   CSN: [id number] Arrival date & time: 09/25/19  1658     History Chief Complaint  Patient presents with  . possible exposure to covid    Randall Cook is a 64 y.o. male.  Patient is a 64 year old male who presents with chills and body aches.  He has had a 2-day history of fatigue and flulike symptoms.  He says that his had some intermittent chills although he does not know if he has had a fever.  His tired and aching in his muscles.  He denies any headache.  He has had some epigastric discomfort associated with some nausea and occasional vomiting.  He did have a fever on arrival to the emergency department today.  No cough or cold symptoms.  No diarrhea.  No urinary symptoms.  No skin wounds.  His wife is here with similar symptoms.  Reportedly they were exposed to people from their church who tested positive for Covid.        Past Medical History:  Diagnosis Date  . Arthritis   . Dysrhythmia    tachycardia- EKG,clearance 02/29/12 Dr Moreen Fowler  on chart  . GERD (gastroesophageal reflux disease)   . Hyperlipidemia   . Hypertension    LOV,   Dr Moreen Fowler 02/29/12 on chart-   CBC with diff, CMET 02/29/12 on chart  . Sleep apnea    STOP BANG SCORE 4    Patient Active Problem List   Diagnosis Date Noted  . Hypertension 01/07/2018  . Hyperlipidemia 04/09/2017  . 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 NO PAST SURGERIES    . PARTIAL KNEE ARTHROPLASTY  03/10/2012   Procedure: UNICOMPARTMENTAL KNEE;  Surgeon: Mauri Pole, MD;  Location: [facility];  Service: Orthopedics;  Laterality: Left;       No family history on file.  Social History   Tobacco Use  . Smoking status: Former Smoker    Packs/day: 0.50    Years: 20.00    Pack years: 10.00    Types: Cigarettes    Quit date: 03/05/1999    Years since quitting: 20.5  . Smokeless tobacco: Never Used  Substance Use Topics  .  Alcohol use: Yes    Comment: none x 20 yrs  . Drug use: No    Home Medications Prior to Admission medications   Medication Sig Start Date End Date Taking? Authorizing Provider  hydrochlorothiazide (HYDRODIURIL) 25 MG tablet TAKE 1 TABLET BY MOUTH ONCE DAILY IN THE MORNING Patient taking differently: Take 25 mg by mouth daily.  11/24/18  Yes Kerin Perna, NP  losartan (COZAAR) 50 MG tablet Take 1 tablet (50 mg total) by mouth daily. 11/24/18  Yes Kerin Perna, NP  ibuprofen (ADVIL,MOTRIN) 600 MG tablet Take 1 tablet (600 mg total) by mouth every 8 (eight) hours as needed. Patient not taking: Reported on 09/25/2019 11/24/18   Kerin Perna, NP  lovastatin (MEVACOR) 20 MG tablet Take 1 tablet (20 mg total) by mouth at bedtime. Patient not taking: Reported on 11/24/2018 01/07/18   Gildardo Pounds, NP    Allergies    Penicillins  Review of Systems   Review of Systems  Constitutional: Positive for chills, fatigue and fever. Negative for diaphoresis.  HENT: Negative for congestion, rhinorrhea and sneezing.   Eyes: Negative.   Respiratory: Negative for cough, chest tightness and shortness of breath.   Cardiovascular: Negative for chest  pain and leg swelling.  Gastrointestinal: Positive for abdominal pain, nausea and vomiting. Negative for blood in stool and diarrhea.  Genitourinary: Negative for difficulty urinating, flank pain, frequency and hematuria.  Musculoskeletal: Positive for myalgias. Negative for arthralgias and back pain.  Skin: Negative for rash.  Neurological: Negative for dizziness, speech difficulty, weakness, numbness and headaches.    Physical Exam Updated Vital Signs BP 107/88   Pulse (!) 37   Temp 100.1 F (37.8 C) (Oral)   Resp 18   SpO2 92%   Physical Exam Constitutional:      Appearance: He is well-developed.  HENT:     Head: Normocephalic and atraumatic.  Eyes:     Pupils: Pupils are equal, round, and reactive to light.  Cardiovascular:      Rate and Rhythm: Normal rate and regular rhythm.     Heart sounds: Normal heart sounds.  Pulmonary:     Effort: Pulmonary effort is normal. No respiratory distress.     Breath sounds: Normal breath sounds. No wheezing or rales.  Chest:     Chest wall: No tenderness.  Abdominal:     General: Bowel sounds are normal.     Palpations: Abdomen is soft.     Tenderness: There is no abdominal tenderness (Mild tenderness to epigastrium). There is no guarding or rebound.  Musculoskeletal:        General: Normal range of motion.     Cervical back: Normal range of motion and neck supple.  Lymphadenopathy:     Cervical: No cervical adenopathy.  Skin:    General: Skin is warm and dry.     Findings: No rash.  Neurological:     Mental Status: He is alert and oriented to person, place, and time.     ED Results / Procedures / Treatments   Labs (all labs ordered are listed, but only abnormal results are displayed) Labs Reviewed  POC SARS CORONAVIRUS 2 AG -  ED - Abnormal; Notable for the following components:      Result Value   SARS Coronavirus 2 Ag POSITIVE (*)    All other components within normal limits  CBC WITH DIFFERENTIAL/PLATELET    EKG EKG Interpretation  Date/Time:  Friday September 25 2019 17:09:07 EST Ventricular Rate:  133 PR Interval:    QRS Duration: 78 QT Interval:  300 QTC Calculation: 446 R Axis:   -3 Text Interpretation: Undetermined rhythm Cannot rule out Anterior infarct , age undetermined Abnormal ECG Confirmed by Rolan Bucco [phone]) on 09/25/2019 8:33:21 PM   Radiology DG Chest Port 1 View  Result Date: 09/25/2019 CLINICAL DATA:  Fever EXAM: PORTABLE CHEST 1 VIEW COMPARISON:  10/02/2012 FINDINGS: Vague right upper and lower lobe opacities. Hazy opacity left lung base. Normal heart size. No pneumothorax. IMPRESSION: Vague hazy right upper lobe and basilar opacities, atelectasis versus early pneumonia. Electronically Signed   By: Jasmine Pang M.D.   On: 09/25/2019  21:23    Procedures Procedures (including critical care time)  Medications Ordered in ED Medications  alum & mag hydroxide-simeth (MAALOX/MYLANTA) 200-200-20 MG/5ML suspension 30 mL (has no administration in time range)  acetaminophen (TYLENOL) tablet 1,000 mg (1,000 mg Oral Given 09/25/19 2232)    ED Course  I have reviewed the triage vital signs and the nursing notes.  Pertinent labs & imaging results that were available during my care of the patient were reviewed by me and considered in my medical decision making (see chart for details).    MDM Rules/Calculators/A&P  Patient is a 64 year old male who presents with upper abdominal pain.  He also has fever and chills.  He denies any shortness of breath but at times seems to have some mild tachypnea.  His chest x-ray shows bilateral infiltrates.  His Covid test is positive.  I initially had ordered some labs given the abdominal pain but these hemolyzed and I did not feel that they need to be reordered because his symptoms are likely related to the Covid.  I checked back on the patient and he does not have any ongoing abdominal pain.  He feels better after dose of Tylenol.  He has no hypoxia.  He has no reports of shortness of breath.  He is ambulating in the room without hypoxia.  His oxygen saturations stayed around 94 to 95% on room air.  He was discharged home in good condition.  He was given Covid precautions and return instructions.  Randall Cook was evaluated in Emergency Department on 09/25/2019 for the symptoms described in the history of present illness. He was evaluated in the context of the global COVID-19 pandemic, which necessitated consideration that the patient might be at risk for infection with the SARS-CoV-2 virus that causes COVID-19. Institutional protocols and algorithms that pertain to the evaluation of patients at risk for COVID-19 are in a state of rapid change based on information released by  regulatory bodies including the CDC and federal and state organizations. These policies and algorithms were followed during the patient's care in the ED.  Final Clinical Impression(s) / ED Diagnoses Final diagnoses:  COVID-19 virus infection    Rx / DC Orders ED Discharge Orders    None       Rolan Bucco, MD 09/25/19 2350

## 2019-09-25 NOTE — Discharge Instructions (Addendum)
Stay at home and quarantine for at least 10 days but you need to have improving symptoms and no fevers for at least 3 days before you can in the quarantine.  Return to the emergency room if you have any worsening symptoms or worsening shortness of breath.

## 2019-09-27 ENCOUNTER — Emergency Department (HOSPITAL_COMMUNITY): Payer: BC Managed Care – PPO

## 2019-09-27 ENCOUNTER — Other Ambulatory Visit: Payer: Self-pay

## 2019-09-27 ENCOUNTER — Inpatient Hospital Stay (HOSPITAL_COMMUNITY)
Admission: EM | Admit: 2019-09-27 | Discharge: 2019-10-03 | DRG: 177 | Disposition: A | Discharge status: Home Health | Payer: BC Managed Care – PPO | Attending: Family Medicine | Admitting: Family Medicine

## 2019-09-27 ENCOUNTER — Encounter (HOSPITAL_COMMUNITY): Payer: Self-pay | Admitting: Emergency Medicine

## 2019-09-27 DIAGNOSIS — G4730 Sleep apnea, unspecified: Secondary | ICD-10-CM | POA: Diagnosis present

## 2019-09-27 DIAGNOSIS — Z96652 Presence of left artificial knee joint: Secondary | ICD-10-CM | POA: Diagnosis present

## 2019-09-27 DIAGNOSIS — J9601 Acute respiratory failure with hypoxia: Secondary | ICD-10-CM | POA: Diagnosis not present

## 2019-09-27 DIAGNOSIS — Z87891 Personal history of nicotine dependence: Secondary | ICD-10-CM | POA: Diagnosis not present

## 2019-09-27 DIAGNOSIS — E669 Obesity, unspecified: Secondary | ICD-10-CM | POA: Diagnosis not present

## 2019-09-27 DIAGNOSIS — U071 COVID-19: Secondary | ICD-10-CM | POA: Diagnosis present

## 2019-09-27 DIAGNOSIS — Z6831 Body mass index (BMI) 31.0-31.9, adult: Secondary | ICD-10-CM | POA: Diagnosis not present

## 2019-09-27 DIAGNOSIS — K219 Gastro-esophageal reflux disease without esophagitis: Secondary | ICD-10-CM | POA: Diagnosis present

## 2019-09-27 DIAGNOSIS — M1990 Unspecified osteoarthritis, unspecified site: Secondary | ICD-10-CM | POA: Diagnosis present

## 2019-09-27 DIAGNOSIS — Z880 Allergy status to penicillin: Secondary | ICD-10-CM | POA: Diagnosis not present

## 2019-09-27 DIAGNOSIS — I10 Essential (primary) hypertension: Secondary | ICD-10-CM | POA: Diagnosis present

## 2019-09-27 DIAGNOSIS — R0602 Shortness of breath: Secondary | ICD-10-CM | POA: Diagnosis present

## 2019-09-27 DIAGNOSIS — E66811 Obesity, class 1: Secondary | ICD-10-CM | POA: Diagnosis present

## 2019-09-27 DIAGNOSIS — E785 Hyperlipidemia, unspecified: Secondary | ICD-10-CM | POA: Diagnosis not present

## 2019-09-27 LAB — CBC WITH DIFFERENTIAL/PLATELET
Abs Immature Granulocytes: 0.02 10*3/uL (ref 0.00–0.07)
Basophils Absolute: 0 10*3/uL (ref 0.0–0.1)
Basophils Relative: 0 %
Eosinophils Absolute: 0 10*3/uL (ref 0.0–0.5)
Eosinophils Relative: 0 %
HCT: 50.1 % (ref 39.0–52.0)
Hemoglobin: 16.8 g/dL (ref 13.0–17.0)
Immature Granulocytes: 0 %
Lymphocytes Relative: 10 %
Lymphs Abs: 0.6 10*3/uL — ABNORMAL LOW (ref 0.7–4.0)
MCH: 31.2 pg (ref 26.0–34.0)
MCHC: 33.5 g/dL (ref 30.0–36.0)
MCV: 92.9 fL (ref 80.0–100.0)
Monocytes Absolute: 0.4 10*3/uL (ref 0.1–1.0)
Monocytes Relative: 6 %
Neutro Abs: 5 10*3/uL (ref 1.7–7.7)
Neutrophils Relative %: 84 %
Platelets: 162 10*3/uL (ref 150–400)
RBC: 5.39 MIL/uL (ref 4.22–5.81)
RDW: 13 % (ref 11.5–15.5)
WBC: 5.9 10*3/uL (ref 4.0–10.5)
nRBC: 0 % (ref 0.0–0.2)

## 2019-09-27 LAB — BLOOD GAS, VENOUS
Acid-Base Excess: 2.4 mmol/L — ABNORMAL HIGH (ref 0.0–2.0)
Bicarbonate: 28.9 mmol/L — ABNORMAL HIGH (ref 20.0–28.0)
FIO2: 21
O2 Saturation: 26.3 %
Patient temperature: 98.6
pCO2, Ven: 53.1 mmHg (ref 44.0–60.0)
pH, Ven: 7.355 (ref 7.250–7.430)
pO2, Ven: <31 mmHg — CL (ref 32.0–45.0)

## 2019-09-27 LAB — COMPREHENSIVE METABOLIC PANEL
ALT: 22 U/L (ref 0–44)
AST: 33 U/L (ref 15–41)
Albumin: 3.4 g/dL — ABNORMAL LOW (ref 3.5–5.0)
Alkaline Phosphatase: 46 U/L (ref 38–126)
Anion gap: 11 (ref 5–15)
BUN: 14 mg/dL (ref 8–23)
CO2: 23 mmol/L (ref 22–32)
Calcium: 8.4 mg/dL — ABNORMAL LOW (ref 8.9–10.3)
Chloride: 99 mmol/L (ref 98–111)
Creatinine, Ser: 1.19 mg/dL (ref 0.61–1.24)
GFR calc Af Amer: >60 mL/min (ref >60)
GFR calc non Af Amer: >60 mL/min (ref >60)
Glucose, Bld: 107 mg/dL — ABNORMAL HIGH (ref 70–99)
Potassium: 4.2 mmol/L (ref 3.5–5.1)
Sodium: 133 mmol/L — ABNORMAL LOW (ref 135–145)
Total Bilirubin: 0.5 mg/dL (ref 0.3–1.2)
Total Protein: 7.2 g/dL (ref 6.5–8.1)

## 2019-09-27 LAB — URINALYSIS, ROUTINE W REFLEX MICROSCOPIC
Bacteria, UA: NONE SEEN
Bilirubin Urine: NEGATIVE
Glucose, UA: NEGATIVE mg/dL
Ketones, ur: 5 mg/dL — AB
Leukocytes,Ua: NEGATIVE
Nitrite: NEGATIVE
Protein, ur: 100 mg/dL — AB
Specific Gravity, Urine: 1.028 (ref 1.005–1.030)
pH: 5 (ref 5.0–8.0)

## 2019-09-27 LAB — C-REACTIVE PROTEIN: CRP: 4.1 mg/dL — ABNORMAL HIGH (ref <1.0)

## 2019-09-27 LAB — PROCALCITONIN: Procalcitonin: <0.1 ng/mL

## 2019-09-27 LAB — TROPONIN I (HIGH SENSITIVITY)
Troponin I (High Sensitivity): 13 ng/L (ref <18)
Troponin I (High Sensitivity): 15 ng/L (ref <18)

## 2019-09-27 LAB — ABO/RH: ABO/RH(D): O POS

## 2019-09-27 LAB — FIBRINOGEN: Fibrinogen: 478 mg/dL — ABNORMAL HIGH (ref 210–475)

## 2019-09-27 LAB — D-DIMER, QUANTITATIVE: D-Dimer, Quant: 1.12 ug/mL-FEU — ABNORMAL HIGH (ref 0.00–0.50)

## 2019-09-27 LAB — FERRITIN: Ferritin: 660 ng/mL — ABNORMAL HIGH (ref 24–336)

## 2019-09-27 LAB — HIV ANTIBODY (ROUTINE TESTING W REFLEX): HIV Screen 4th Generation wRfx: NONREACTIVE

## 2019-09-27 MED ORDER — ALBUTEROL SULFATE HFA 108 (90 BASE) MCG/ACT IN AERS
2.0000 | INHALATION_SPRAY | Freq: Four times a day (QID) | RESPIRATORY_TRACT | Status: DC
  Administered 2019-09-27 – 2019-10-03 (×23): 2 via RESPIRATORY_TRACT
  Filled 2019-09-27 (×2): qty 6.7

## 2019-09-27 MED ORDER — ACETAMINOPHEN 325 MG PO TABS
650.0000 mg | ORAL_TABLET | Freq: Once | ORAL | Status: AC
  Administered 2019-09-27: 16:00:00 650 mg via ORAL
  Filled 2019-09-27: qty 2

## 2019-09-27 MED ORDER — GUAIFENESIN-DM 100-10 MG/5ML PO SYRP: 10.0000 mL | ORAL_SOLUTION | ORAL | Status: DC | PRN

## 2019-09-27 MED ORDER — ENOXAPARIN SODIUM 40 MG/0.4ML ~~LOC~~ SOLN
40.0000 mg | SUBCUTANEOUS | Status: DC
  Administered 2019-09-27 – 2019-10-02 (×6): 40 mg via SUBCUTANEOUS
  Filled 2019-09-27 (×6): qty 0.4

## 2019-09-27 MED ORDER — SODIUM CHLORIDE 0.9 % IV BOLUS
500.0000 mL | Freq: Once | INTRAVENOUS | Status: AC
  Administered 2019-09-27: 21:00:00 500 mL via INTRAVENOUS

## 2019-09-27 MED ORDER — SODIUM CHLORIDE 0.9 % IV SOLN
200.0000 mg | Freq: Once | INTRAVENOUS | Status: AC
  Administered 2019-09-27: 21:00:00 200 mg via INTRAVENOUS
  Filled 2019-09-27: qty 200

## 2019-09-27 MED ORDER — ONDANSETRON HCL 4 MG PO TABS
4.0000 mg | ORAL_TABLET | Freq: Four times a day (QID) | ORAL | Status: DC | PRN
  Administered 2019-09-30: 4 mg via ORAL
  Filled 2019-09-27: qty 1

## 2019-09-27 MED ORDER — ACETAMINOPHEN 325 MG PO TABS
650.0000 mg | ORAL_TABLET | Freq: Four times a day (QID) | ORAL | Status: DC | PRN
  Administered 2019-09-28 – 2019-10-03 (×7): 650 mg via ORAL
  Filled 2019-09-27 (×7): qty 2

## 2019-09-27 MED ORDER — ONDANSETRON HCL 4 MG/2ML IJ SOLN: 4.0000 mg | Freq: Four times a day (QID) | INTRAMUSCULAR | Status: DC | PRN

## 2019-09-27 MED ORDER — DEXAMETHASONE 6 MG PO TABS
6.0000 mg | ORAL_TABLET | Freq: Every day | ORAL | Status: DC
  Administered 2019-09-27 – 2019-10-03 (×7): 6 mg via ORAL
  Filled 2019-09-27 (×2): qty 1
  Filled 2019-09-27: qty 2
  Filled 2019-09-27: qty 1
  Filled 2019-09-27: qty 2
  Filled 2019-09-27: qty 1
  Filled 2019-09-27: qty 2

## 2019-09-27 MED ORDER — SODIUM CHLORIDE 0.9 % IV SOLN
100.0000 mg | Freq: Every day | INTRAVENOUS | Status: AC
  Administered 2019-09-28 – 2019-10-01 (×4): 100 mg via INTRAVENOUS
  Filled 2019-09-27: qty 100
  Filled 2019-09-27: qty 20
  Filled 2019-09-27 (×2): qty 100

## 2019-09-27 MED ORDER — HYDROCOD POLST-CPM POLST ER 10-8 MG/5ML PO SUER
5.0000 mL | Freq: Two times a day (BID) | ORAL | Status: DC | PRN
  Administered 2019-09-30 – 2019-10-01 (×2): 5 mL via ORAL
  Filled 2019-09-27 (×2): qty 5

## 2019-09-27 NOTE — ED Notes (Addendum)
Date and time results received: 09/27/19 9:59 PM  (use smartphrase ".now" to insert current time)  Test: Blood Gas, venous Critical Value: O2 <31 Name of Provider Notified: Julian Reil  Orders Received? Or Actions Taken?: Actions Taken: MD notified

## 2019-09-27 NOTE — ED Provider Notes (Signed)
[facility]-EMERGENCY DEPT Provider Note   CSN: [id number] Arrival date & time: 09/27/19  1232     History Chief Complaint  Patient presents with  . covid positive  . Shortness of Breath    Randall Cook is a 64 y.o. male.  HPI   Patient presents for evaluation of generalized illness, associated with Covid positive status.  He complains of cough with upper abdominal pain that he feels is secondary to her coughing.  He complains of shortness of breath, which is worse with exertion.  He has had periods of hot and cold but has not taken his temperature.  He denies vomiting, dizziness or focal weakness.  He is taking his usual medications without relief.  He has chronic arthritis, with generalized joint achiness, which is ongoing.  Uses Tylenol, Voltaren gel, and ibuprofen.  There are no other known modifying factors.     Past Medical History:  Diagnosis Date  . Arthritis   . Dysrhythmia    tachycardia- EKG,clearance 02/29/12 Dr Azucena Cecil  on chart  . GERD (gastroesophageal reflux disease)   . Hyperlipidemia   . Hypertension    LOV,   Dr Azucena Cecil 02/29/12 on chart-   CBC with diff, CMET 02/29/12 on chart  . Sleep apnea    STOP BANG SCORE 4    Patient Active Problem List   Diagnosis Date Noted  . Acute hypoxemic respiratory failure due to COVID-19 (HCC) 09/27/2019  . Hypertension 01/07/2018  . Hyperlipidemia 04/09/2017  . 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 NO PAST SURGERIES    . PARTIAL KNEE ARTHROPLASTY  03/10/2012   Procedure: UNICOMPARTMENTAL KNEE;  Surgeon: [name], MD;  Location: [facility];  Service: Orthopedics;  Laterality: Left;       No family history on file.  Social History   Tobacco Use  . Smoking status: Former Smoker    Packs/day: 0.50    Years: 20.00    Pack years: 10.00    Types: Cigarettes    Quit date: 03/05/1999    Years since quitting: 20.5  . Smokeless tobacco: Never Used    Substance Use Topics  . Alcohol use: Yes    Comment: none x 20 yrs  . Drug use: No    Home Medications Prior to Admission medications   Medication Sig Start Date End Date Taking? Authorizing Provider  hydrochlorothiazide (HYDRODIURIL) 25 MG tablet TAKE 1 TABLET BY MOUTH ONCE DAILY IN THE MORNING Patient taking differently: Take 25 mg by mouth daily.  11/24/18   Grayce Sessions, NP  ibuprofen (ADVIL,MOTRIN) 600 MG tablet Take 1 tablet (600 mg total) by mouth every 8 (eight) hours as needed. Patient not taking: Reported on 09/25/2019 11/24/18   Grayce Sessions, NP  losartan (COZAAR) 50 MG tablet Take 1 tablet (50 mg total) by mouth daily. 11/24/18   Grayce Sessions, NP  lovastatin (MEVACOR) 20 MG tablet Take 1 tablet (20 mg total) by mouth at bedtime. Patient not taking: Reported on 11/24/2018 01/07/18   Claiborne Rigg, NP    Allergies    Penicillins  Review of Systems   Review of Systems  All other systems reviewed and are negative.   Physical Exam Updated Vital Signs BP (!) 130/97 (BP Location: Left Arm)   Pulse (!) 114   Temp 99.2 F (37.3 C) (Oral)   Resp (!) 25   SpO2 95%   Physical Exam Vitals and  nursing note reviewed.  Constitutional:      Appearance: He is well-developed.  HENT:     Head: Normocephalic and atraumatic.     Right Ear: External ear normal.     Left Ear: External ear normal.  Eyes:     Conjunctiva/sclera: Conjunctivae normal.     Pupils: Pupils are equal, round, and reactive to light.  Neck:     Trachea: Phonation normal.  Cardiovascular:     Rate and Rhythm: Normal rate.  Pulmonary:     Effort: Pulmonary effort is normal. No respiratory distress.     Breath sounds: No stridor.  Chest:     Chest wall: No tenderness.  Abdominal:     General: There is no distension.     Palpations: Abdomen is soft.     Tenderness: There is no abdominal tenderness.  Musculoskeletal:        General: No swelling or tenderness. Normal range of motion.      Cervical back: Normal range of motion and neck supple.  Skin:    General: Skin is warm and dry.  Neurological:     Mental Status: He is alert and oriented to person, place, and time.     Cranial Nerves: No cranial nerve deficit.     Sensory: No sensory deficit.     Motor: No abnormal muscle tone.     Coordination: Coordination normal.  Psychiatric:        Mood and Affect: Mood normal.        Behavior: Behavior normal.        Thought Content: Thought content normal.        Judgment: Judgment normal.     ED Results / Procedures / Treatments   Labs (all labs ordered are listed, but only abnormal results are displayed) Labs Reviewed  COMPREHENSIVE METABOLIC PANEL - Abnormal; Notable for the following components:      Result Value   Sodium 133 (*)    Glucose, Bld 107 (*)    Calcium 8.4 (*)    Albumin 3.4 (*)    All other components within normal limits  CBC WITH DIFFERENTIAL/PLATELET - Abnormal; Notable for the following components:   Lymphs Abs 0.6 (*)    All other components within normal limits  C-REACTIVE PROTEIN - Abnormal; Notable for the following components:   CRP 4.1 (*)    All other components within normal limits  D-DIMER, QUANTITATIVE (NOT AT [facility]) - Abnormal; Notable for the following components:   D-Dimer, Quant 1.12 (*)    All other components within normal limits  FERRITIN - Abnormal; Notable for the following components:   Ferritin 660 (*)    All other components within normal limits  FIBRINOGEN - Abnormal; Notable for the following components:   Fibrinogen 478 (*)    All other components within normal limits  BLOOD GAS, VENOUS  URINALYSIS, ROUTINE W REFLEX MICROSCOPIC  HIV ANTIBODY (ROUTINE TESTING W REFLEX)  CBC WITH DIFFERENTIAL/PLATELET  COMPREHENSIVE METABOLIC PANEL  C-REACTIVE PROTEIN  D-DIMER, QUANTITATIVE (NOT AT [facility])  PROCALCITONIN  ABO/RH  ABO/RH  TROPONIN I (HIGH SENSITIVITY)  TROPONIN I (HIGH SENSITIVITY)    EKG EKG  Interpretation  Date/Time:  Sunday September 27 2019 13:48:24 EST Ventricular Rate:  112 PR Interval:    QRS Duration: 86 QT Interval:  320 QTC Calculation: 437 R Axis:   -13 Text Interpretation: Sinus tachycardia Atrial premature complexes Probable left atrial enlargement Abnormal R-wave progression, early transition since last tracing no significant change Confirmed  by Mancel Bale [phone]) on 09/27/2019 2:04:23 PM   Radiology DG Chest Port 1 View  Result Date: 09/27/2019 CLINICAL DATA:  Short of breath. EXAM: PORTABLE CHEST 1 VIEW COMPARISON:  09/25/2019 FINDINGS: Heart size appears normal. No pleural effusion or edema. Asymmetric opacity within the right upper lobe is again noted and appears unchanged from previous exam. No new findings. Osseous structures unremarkable. IMPRESSION: 1. No change in appearance of right upper lobe opacity. 2. No new findings. Electronically Signed   By: Signa Kell M.D.   On: 09/27/2019 14:45   DG Chest Port 1 View  Result Date: 09/25/2019 CLINICAL DATA:  Fever EXAM: PORTABLE CHEST 1 VIEW COMPARISON:  10/02/2012 FINDINGS: Vague right upper and lower lobe opacities. Hazy opacity left lung base. Normal heart size. No pneumothorax. IMPRESSION: Vague hazy right upper lobe and basilar opacities, atelectasis versus early pneumonia. Electronically Signed   By: Jasmine Pang M.D.   On: 09/25/2019 21:23    Procedures .Critical Care Performed by: Mancel Bale, MD Authorized by: Mancel Bale, MD   Critical care provider statement:    Critical care time (minutes):  50   Critical care start time:  09/27/2019 1:50 PM   Critical care time was exclusive of:  Separately billable procedures and treating other patients   Critical care was necessary to treat or prevent imminent or life-threatening deterioration of the following conditions:  Respiratory failure   Critical care was time spent personally by me on the following activities:  Blood draw for specimens,  development of treatment plan with patient or surrogate, discussions with consultants, evaluation of patient's response to treatment, examination of patient, obtaining history from patient or surrogate, ordering and performing treatments and interventions, ordering and review of laboratory studies, pulse oximetry, re-evaluation of patient's condition, review of old charts and ordering and review of radiographic studies   (including critical care time)  Medications Ordered in ED Medications  sodium chloride 0.9 % bolus 500 mL (has no administration in time range)  enoxaparin (LOVENOX) injection 40 mg (has no administration in time range)  albuterol (VENTOLIN HFA) 108 (90 Base) MCG/ACT inhaler 2 puff (has no administration in time range)  dexamethasone (DECADRON) tablet 6 mg (has no administration in time range)  guaiFENesin-dextromethorphan (ROBITUSSIN DM) 100-10 MG/5ML syrup 10 mL (has no administration in time range)  chlorpheniramine-HYDROcodone (TUSSIONEX) 10-8 MG/5ML suspension 5 mL (has no administration in time range)  remdesivir 200 mg in sodium chloride 0.9% 250 mL IVPB (has no administration in time range)    Followed by  remdesivir 100 mg in sodium chloride 0.9 % 100 mL IVPB (has no administration in time range)  acetaminophen (TYLENOL) tablet 650 mg (has no administration in time range)  ondansetron (ZOFRAN) tablet 4 mg (has no administration in time range)    Or  ondansetron (ZOFRAN) injection 4 mg (has no administration in time range)  acetaminophen (TYLENOL) tablet 650 mg (650 mg Oral Given 09/27/19 1606)    ED Course  I have reviewed the triage vital signs and the nursing notes.  Pertinent labs & imaging results that were available during my care of the patient were reviewed by me and considered in my medical decision making (see chart for details).  Clinical Course as of Sep 26 1933  Wynelle Link Sep 27, 2019  1745 Elevated  Fibrinogen(!) [EW]  1745 Normal except sodium low,  glucose high, calcium low, albumin low  Comprehensive metabolic panel(!) [EW]  1745 Normal  Troponin I (High Sensitivity) [EW]  1745 Elevated,  ordered as Covid marker  D-dimer, quantitative (not at [facility])(!) [EW]  1746 Normal  CBC with Differential(!) [EW]  1746 Right upper lobe opacity, per radiology unchanged from 2 days ago.  DG Chest Port 1 View [EW]  1747 Abnormal, low.  Improved to 95% on 2 L oxygen by nasal cannula.  SpO2(!): 88 % [EW]    Clinical Course User Index [EW] Mancel Bale, MD   MDM Rules/Calculators/A&P                       Patient Vitals for the past 24 hrs:  BP Temp Temp src Pulse Resp SpO2  09/27/19 1558 (!) 130/97 -- -- (!) 114 (!) 25 95 %  09/27/19 1341 114/85 99.2 F (37.3 C) Oral (!) 38 (!) 26 (!) 88 %    7:12 PM Reevaluation with update and discussion. After initial assessment and treatment, an updated evaluation reveals oxygen saturation 98% on 3 L nasal cannula oxygen.  He has no further complaints.  I discussed the findings for his illness, and his wife's.  She is also being admitted for problems from COVID-19 infection.  All questions answered. Mancel Bale   Medical Decision Making: COVID-19 infection with respiratory failure and hypoxia.  He requires oxygenation with nasal cannula oxygen at 3 L.  Elevated markers consistent with moderate inflammatory disease associated with COVID-19 infection.  Patient being admitted for stabilization and monitoring.  Randall Cook was evaluated in Emergency Department on 09/27/2019 for the symptoms described in the history of present illness. He was evaluated in the context of the global COVID-19 pandemic, which necessitated consideration that the patient might be at risk for infection with the SARS-CoV-2 virus that causes COVID-19. Institutional protocols and algorithms that pertain to the evaluation of patients at risk for COVID-19 are in a state of rapid change based on information released by regulatory bodies  including the CDC and federal and state organizations. These policies and algorithms were followed during the patient's care in the ED.   CRITICAL CARE-yes Performed by: Mancel Bale  Nursing Notes Reviewed/ Care Coordinated Applicable Imaging Reviewed Interpretation of Laboratory Data incorporated into ED treatment  Case discussed with hospitalist who will admit the patient.  Final Clinical Impression(s) / ED Diagnoses Final diagnoses:  Acute respiratory failure with hypoxia (HCC)  COVID-19 virus infection    Rx / DC Orders ED Discharge Orders    None       Mancel Bale, MD 09/27/19 1935

## 2019-09-27 NOTE — ED Triage Notes (Signed)
Patient and his wife tested positive on 1/1-states increased SOB, dyspnea upon exertion

## 2019-09-27 NOTE — H&P (Signed)
History and Physical    Randall Cook XNA:[id number] DOB: 1956-05-16 DOA: 09/27/2019  PCP: Randall Cook  Patient coming from: Home  I have personally briefly reviewed patient's old medical records in [facility]  Chief Complaint: COVID  HPI: Randall Cook is a 64 y.o. male with medical history significant of HTN.  Patient tested positive for COVID on 1/1, now having worsening SOB, cough with upper abd pain.  No vomiting, focal weakness.   ED Course: New O2 requirement of 3L.  CRP 4.1.  WBC 5.9k.  CXR with RUL opacity.   Review of Systems: As per HPI, otherwise all review of systems negative.  Past Medical History:  Diagnosis Date  . Arthritis   . Dysrhythmia    tachycardia- EKG,clearance 02/29/12 Randall Cook  on chart  . GERD (gastroesophageal reflux disease)   . Hyperlipidemia   . Hypertension    LOV,   Randall Cook 02/29/12 on chart-   CBC with diff, CMET 02/29/12 on chart  . Sleep apnea    STOP BANG SCORE 4    Past Surgical History:  Procedure Laterality Date  . NO PAST SURGERIES    . PARTIAL KNEE ARTHROPLASTY  03/10/2012   Procedure: UNICOMPARTMENTAL KNEE;  Surgeon: Randall Pal, MD;  Location: [facility];  Service: Orthopedics;  Laterality: Left;     reports that he quit smoking about 20 years ago. His smoking use included cigarettes. He has a 10.00 pack-year smoking history. He has never used smokeless tobacco. He reports current alcohol use. He reports that he does not use drugs.  Allergies  Allergen Reactions  . Penicillins Rash    Has patient had a PCN reaction causing immediate rash, facial/tongue/throat swelling, SOB or lightheadedness with hypotension: Unknown Has patient had a PCN reaction causing severe rash involving mucus membranes or skin necrosis: No  Has patient had a PCN reaction that required hospitalization: No  Has patient had a PCN reaction occurring within the last 10 years: Yes  If all of the above answers are "NO", then may proceed with  Cephalosporin use.     No family history on file. Wife also sick with COVID.  Prior to Admission medications   Medication Sig Start Date End Date Taking? Authorizing Provider  hydrochlorothiazide (HYDRODIURIL) 25 MG tablet TAKE 1 TABLET BY MOUTH ONCE DAILY IN THE MORNING Patient taking differently: Take 25 mg by mouth daily.  11/24/18   Randall Cook  ibuprofen (ADVIL,MOTRIN) 600 MG tablet Take 1 tablet (600 mg total) by mouth every 8 (eight) hours as needed. Patient not taking: Reported on 09/25/2019 11/24/18   Randall Cook  losartan (COZAAR) 50 MG tablet Take 1 tablet (50 mg total) by mouth daily. 11/24/18   Randall Cook  lovastatin (MEVACOR) 20 MG tablet Take 1 tablet (20 mg total) by mouth at bedtime. Patient not taking: Reported on 11/24/2018 01/07/18   Randall Rigg, Cook    Physical Exam: Vitals:   09/27/19 1341 09/27/19 1558  BP: 114/85 (!) 130/97  Pulse: (!) 38 (!) 114  Resp: (!) 26 (!) 25  Temp: 99.2 F (37.3 C)   TempSrc: Oral   SpO2: (!) 88% 95%    Constitutional: NAD, calm, comfortable Eyes: PERRL, lids and conjunctivae normal ENMT: Mucous membranes are moist. Posterior pharynx clear of any exudate or lesions.Normal dentition.  Neck: normal, supple, no masses, no thyromegaly Respiratory: clear to auscultation bilaterally, no wheezing, no crackles. Normal respiratory effort. No  accessory muscle use.  Cardiovascular: Regular rate and rhythm, no murmurs / rubs / gallops. No extremity edema. 2+ pedal pulses. No carotid bruits.  Abdomen: no tenderness, no masses palpated. No hepatosplenomegaly. Bowel sounds positive.  Musculoskeletal: no clubbing / cyanosis. No joint deformity upper and lower extremities. Good ROM, no contractures. Normal muscle tone.  Skin: no rashes, lesions, ulcers. No induration Neurologic: CN 2-12 grossly intact. Sensation intact, DTR normal. Strength 5/5 in all 4.  Psychiatric: Normal judgment and insight. Alert and oriented  x 3. Normal mood.    Labs on Admission: I have personally reviewed following labs and imaging studies  CBC: Recent Labs  Lab 09/27/19 1614  WBC 5.9  NEUTROABS 5.0  HGB 16.8  HCT 50.1  MCV 92.9  PLT 132   Basic Metabolic Panel: Recent Labs  Lab 09/27/19 1614  NA 133*  K 4.2  CL 99  CO2 23  GLUCOSE 107*  BUN 14  CREATININE 1.19  CALCIUM 8.4*   GFR: CrCl cannot be calculated (Unknown ideal weight.). Liver Function Tests: Recent Labs  Lab 09/27/19 1614  AST 33  ALT 22  ALKPHOS 46  BILITOT 0.5  PROT 7.2  ALBUMIN 3.4*   No results for input(s): LIPASE, AMYLASE in the last 168 hours. No results for input(s): AMMONIA in the last 168 hours. Coagulation Profile: No results for input(s): INR, PROTIME in the last 168 hours. Cardiac Enzymes: No results for input(s): CKTOTAL, CKMB, CKMBINDEX, TROPONINI in the last 168 hours. BNP (last 3 results) No results for input(s): PROBNP in the last 8760 hours. HbA1C: No results for input(s): HGBA1C in the last 72 hours. CBG: No results for input(s): GLUCAP in the last 168 hours. Lipid Profile: No results for input(s): CHOL, HDL, LDLCALC, TRIG, CHOLHDL, LDLDIRECT in the last 72 hours. Thyroid Function Tests: No results for input(s): TSH, T4TOTAL, FREET4, T3FREE, THYROIDAB in the last 72 hours. Anemia Panel: Recent Labs    09/27/19 1614  FERRITIN 660*   Urine analysis:    Component Value Date/Time   COLORURINE YELLOW 03/04/2012 1030   APPEARANCEUR CLEAR 03/04/2012 1030   LABSPEC 1.021 03/04/2012 1030   PHURINE 6.0 03/04/2012 1030   GLUCOSEU NEGATIVE 03/04/2012 1030   HGBUR NEGATIVE 03/04/2012 1030   BILIRUBINUR NEGATIVE 03/04/2012 1030   KETONESUR NEGATIVE 03/04/2012 1030   PROTEINUR NEGATIVE 03/04/2012 1030   UROBILINOGEN 0.2 03/04/2012 1030   NITRITE NEGATIVE 03/04/2012 1030   LEUKOCYTESUR NEGATIVE 03/04/2012 1030    Radiological Exams on Admission: DG Chest Port 1 View  Result Date: 09/27/2019 CLINICAL  DATA:  Short of breath. EXAM: PORTABLE CHEST 1 VIEW COMPARISON:  09/25/2019 FINDINGS: Heart size appears normal. No pleural effusion or edema. Asymmetric opacity within the right upper lobe is again noted and appears unchanged from previous exam. No new findings. Osseous structures unremarkable. IMPRESSION: 1. No change in appearance of right upper lobe opacity. 2. No new findings. Electronically Signed   By: Kerby Moors M.D.   On: 09/27/2019 14:45   DG Chest Port 1 View  Result Date: 09/25/2019 CLINICAL DATA:  Fever EXAM: PORTABLE CHEST 1 VIEW COMPARISON:  10/02/2012 FINDINGS: Vague right upper and lower lobe opacities. Hazy opacity left lung base. Normal heart size. No pneumothorax. IMPRESSION: Vague hazy right upper lobe and basilar opacities, atelectasis versus early pneumonia. Electronically Signed   By: Donavan Foil M.D.   On: 09/25/2019 21:23    EKG: Independently reviewed.  Assessment/Plan Active Problems:   Acute hypoxemic respiratory failure due to COVID-19 [facility])  1. COVID-19 PNA with new O2 requirement - 1. COVID pathway 2. remdesivir 3. Decadron 4. Daily labs 5. Cont pulse ox 6. CRP only 4.1 so not candidate for actemra 7. Nl WBC, pro calcitonin ordered and pending.  DVT prophylaxis: Lovenox Code Status: Full Family Communication: No family in room, looks like wife also in ED with COVID and also getting admitted to hospital though. Disposition Plan: Home after admit Consults called: None Admission status: Admit to inpatient  Severity of Illness: The appropriate patient status for this patient is INPATIENT. Inpatient status is judged to be reasonable and necessary in order to provide the required intensity of service to ensure the patient's safety. The patient's presenting symptoms, physical exam findings, and initial radiographic and laboratory data in the context of their chronic comorbidities is felt to place them at high risk for further clinical deterioration.  Furthermore, it is not anticipated that the patient will be medically stable for discharge from the hospital within 2 midnights of admission. The following factors support the patient status of inpatient.   IP status due to COVID with new O2 requirement.   * I certify that at the point of admission it is my clinical judgment that the patient will require inpatient hospital care spanning beyond 2 midnights from the point of admission due to high intensity of service, high risk for further deterioration and high frequency of surveillance required.*    Altan Kraai M. DO [facility]  How to contact the [facility] Attending or Consulting provider 7A - 7P or covering provider during after hours 7P -7A, for this patient?  1. Check the care team in [facility] and look for a) attending/consulting [facility] provider listed and b) the [facility] team listed 2. Log into [email or web address]  Amion Physician Scheduling and messaging for groups and whole hospitals  On call and physician scheduling software for group practices, residents, hospitalists and other medical providers for call, clinic, rotation and shift schedules. OnCall [employer] is a hospital-wide system for scheduling doctors and paging doctors on call. EasyPlot is for scientific plotting and data analysis.  [email or web address]  and use [facility] universal password to access. If you do not have the password, please contact the hospital operator.  3. Locate the [facility] provider you are looking for under [facility] and page to a number that you can be directly reached. 4. If you still have difficulty reaching the provider, please page the [facility] (Director on Call) for the Hospitalists listed on amion for assistance.  09/27/2019, 7:47 PM

## 2019-09-28 LAB — CBC WITH DIFFERENTIAL/PLATELET
Abs Immature Granulocytes: 0.02 10*3/uL (ref 0.00–0.07)
Basophils Absolute: 0 10*3/uL (ref 0.0–0.1)
Basophils Relative: 0 %
Eosinophils Absolute: 0 10*3/uL (ref 0.0–0.5)
Eosinophils Relative: 0 %
HCT: 46.8 % (ref 39.0–52.0)
Hemoglobin: 15.5 g/dL (ref 13.0–17.0)
Immature Granulocytes: 1 %
Lymphocytes Relative: 10 %
Lymphs Abs: 0.4 10*3/uL — ABNORMAL LOW (ref 0.7–4.0)
MCH: 30.9 pg (ref 26.0–34.0)
MCHC: 33.1 g/dL (ref 30.0–36.0)
MCV: 93.2 fL (ref 80.0–100.0)
Monocytes Absolute: 0.2 10*3/uL (ref 0.1–1.0)
Monocytes Relative: 5 %
Neutro Abs: 3.7 10*3/uL (ref 1.7–7.7)
Neutrophils Relative %: 84 %
Platelets: 162 10*3/uL (ref 150–400)
RBC: 5.02 MIL/uL (ref 4.22–5.81)
RDW: 13.2 % (ref 11.5–15.5)
WBC: 4.4 10*3/uL (ref 4.0–10.5)
nRBC: 0 % (ref 0.0–0.2)

## 2019-09-28 LAB — COMPREHENSIVE METABOLIC PANEL
ALT: 21 U/L (ref 0–44)
AST: 33 U/L (ref 15–41)
Albumin: 3.2 g/dL — ABNORMAL LOW (ref 3.5–5.0)
Alkaline Phosphatase: 42 U/L (ref 38–126)
Anion gap: 10 (ref 5–15)
BUN: 13 mg/dL (ref 8–23)
CO2: 24 mmol/L (ref 22–32)
Calcium: 8.2 mg/dL — ABNORMAL LOW (ref 8.9–10.3)
Chloride: 101 mmol/L (ref 98–111)
Creatinine, Ser: 1.06 mg/dL (ref 0.61–1.24)
GFR calc Af Amer: >60 mL/min (ref >60)
GFR calc non Af Amer: >60 mL/min (ref >60)
Glucose, Bld: 127 mg/dL — ABNORMAL HIGH (ref 70–99)
Potassium: 4.3 mmol/L (ref 3.5–5.1)
Sodium: 135 mmol/L (ref 135–145)
Total Bilirubin: 0.4 mg/dL (ref 0.3–1.2)
Total Protein: 6.7 g/dL (ref 6.5–8.1)

## 2019-09-28 LAB — C-REACTIVE PROTEIN: CRP: 5.4 mg/dL — ABNORMAL HIGH (ref <1.0)

## 2019-09-28 LAB — ABO/RH: ABO/RH(D): O POS

## 2019-09-28 LAB — D-DIMER, QUANTITATIVE: D-Dimer, Quant: 1.08 ug/mL-FEU — ABNORMAL HIGH (ref 0.00–0.50)

## 2019-09-28 MED ORDER — LOSARTAN POTASSIUM 25 MG PO TABS
50.0000 mg | ORAL_TABLET | Freq: Every day | ORAL | Status: DC
  Administered 2019-09-28 – 2019-10-03 (×6): 50 mg via ORAL
  Filled 2019-09-28: qty 1
  Filled 2019-09-28 (×2): qty 2
  Filled 2019-09-28: qty 1
  Filled 2019-09-28: qty 2
  Filled 2019-09-28: qty 1

## 2019-09-28 MED ORDER — HYDROCHLOROTHIAZIDE 25 MG PO TABS
25.0000 mg | ORAL_TABLET | Freq: Every day | ORAL | Status: DC
  Administered 2019-09-28 – 2019-10-03 (×6): 25 mg via ORAL
  Filled 2019-09-28 (×6): qty 1

## 2019-09-28 MED ORDER — ORAL CARE MOUTH RINSE
15.0000 mL | Freq: Two times a day (BID) | OROMUCOSAL | Status: DC
  Administered 2019-09-29 – 2019-10-03 (×8): 15 mL via OROMUCOSAL

## 2019-09-28 NOTE — Progress Notes (Signed)
PROGRESS NOTE  Randall Cook  DOB: 01/26/1956  PCP: Grayce Sessions, NP ZDG:[id number]  DOA: 09/27/2019 Admitted From: Home  LOS: 1 day   Chief Complaint  Patient presents with  . covid positive  . Shortness of Breath   Brief narrative: Randall Cook is a 64 y.o. male with PMH of hypertension Patient presented to the ED on 09/27/2019 from home along with his wife both of who are Covid positive on 1/1.  Patient had worsening SOB, cough with upper abd pain.  In the ED, patient was noted to be hypoxic and required supplemental oxygen at 2 L/min. WBC count 5.9 CRP 4.1 CXR with RUL opacity.  Subjective: Patient was seen and examined this morning.  Pleasant middle-aged African-American male. Not in distress on oxygen supplementation.  Assessment/Plan: COVID pneumonia Acute respiratory failure with hypoxia  -Presented with worsening shortness of breath, cough -Chest imaging -right upper lobe opacity -Treatment: Decadron 6 mg daily for 10 days, IV Remdesivir for 5 days to complete on 1/7, -Supportive care: Vitamin C, Zinc, inhalers, Tylenol, Antitussives - benzonatate, Mucinex -Oxygen - SpO2: 95 % O2 Flow Rate (L/min): 3 L/min -Continue airborne/contact isolation precautions. -Labs and biomarker trend as below  No results found for: SARSCOV2NAA  Recent Labs  Lab 09/27/19 1614 09/28/19 0436  WBC 5.9 4.4   Recent Labs    09/27/19 1614 09/28/19 0436  DDIMER 1.12* 1.08*  FERRITIN 660*  --   CRP 4.1* 5.4*   Hypertension -Blood pressure controlled on HCTZ, losartan.  HLD -On lovastatin at home.  Mobility: Encourage ambulation in the room.May need PT eval once oxygen requirement improves Diet: Cardiac diet Fluid: None DVT prophylaxis:  Lovenox Code Status:  Full code Family Communication:  Wife is hospitalized as well with Covid Expected Discharge:  Continue inpatient care  Consultants:  None  Procedures:  None  Antimicrobials: Anti-infectives (From  admission, onward)   Start     Dose/Rate Route Frequency Ordered Stop   09/28/19 1000  remdesivir 100 mg in sodium chloride 0.9 % 100 mL IVPB     100 mg 200 mL/hr over 30 Minutes Intravenous Daily 09/27/19 1922 10/02/19 0959   09/27/19 2030  remdesivir 200 mg in sodium chloride 0.9% 250 mL IVPB     200 mg 580 mL/hr over 30 Minutes Intravenous Once 09/27/19 1922 09/27/19 2242        Code Status: Full Code   Diet Order            Diet Heart Room service appropriate? Yes; Fluid consistency: Thin  Diet effective now              Infusions:  . remdesivir 100 mg in NS 100 mL 100 mg (09/28/19 1037)    Scheduled Meds: . albuterol  2 puff Inhalation Q6H  . dexamethasone  6 mg Oral Daily  . enoxaparin (LOVENOX) injection  40 mg Subcutaneous Q24H  . hydrochlorothiazide  25 mg Oral Daily  . losartan  50 mg Oral Daily  . [START ON 09/29/2019] mouth rinse  15 mL Mouth Rinse BID    PRN meds: acetaminophen, chlorpheniramine-HYDROcodone, guaiFENesin-dextromethorphan, ondansetron **OR** ondansetron (ZOFRAN) IV   Objective: Vitals:   09/28/19 0100 09/28/19 0132  BP: 122/80 (!) 145/101  Pulse: 88 89  Resp: (!) 26 (!) 28  Temp:  99.7 F (37.6 C)  SpO2: 92% 95%    Intake/Output Summary (Last 24 hours) at 09/28/2019 1253 Last data filed at 09/28/2019 1037 Gross per 24 hour  Intake  970 ml  Output --  Net 970 ml   Filed Weights   09/28/19 0132  Weight: 107.3 kg   Weight change:  Body mass index is 31.21 kg/m.   Physical Exam: General exam: Appears calm and comfortable.  Skin: No rashes, lesions or ulcers. HEENT: Atraumatic, normocephalic, supple neck, no obvious bleeding Lungs: Clear to auscultation bilaterally CVS: Regular rate and rhythm, no murmur GI/Abd soft, nontender, nondistended, bowel sound present CNS: Alert, awake monitor x3 Psychiatry: Mood appropriate Extremities: No pedal edema, no calf tenderness  Data Review: I have personally reviewed the laboratory data  and studies available.  Recent Labs  Lab 09/27/19 1614 09/28/19 0436  WBC 5.9 4.4  NEUTROABS 5.0 3.7  HGB 16.8 15.5  HCT 50.1 46.8  MCV 92.9 93.2  PLT 162 162   Recent Labs  Lab 09/27/19 1614 09/28/19 0436  NA 133* 135  K 4.2 4.3  CL 99 101  CO2 23 24  GLUCOSE 107* 127*  BUN 14 13  CREATININE 1.19 1.06  CALCIUM 8.4* 8.2*    Terrilee Croak, MD  [facility] 09/28/2019

## 2019-09-28 NOTE — Plan of Care (Signed)
  Problem: Pain Managment: Goal: General experience of comfort will improve Outcome: Progressing   Problem: Education: Goal: Knowledge of General Education information will improve Description: Including pain rating scale, medication(s)/side effects and non-pharmacologic comfort measures Outcome: Completed/Met   Problem: Health Behavior/Discharge Planning: Goal: Ability to manage health-related needs will improve Outcome: Completed/Met   Problem: Activity: Goal: Risk for activity intolerance will decrease Outcome: Completed/Met

## 2019-09-28 NOTE — Progress Notes (Signed)
Pt ambulated to BR on room air, oxygen sat decreased to 84% while ambulating. Pt placed back on 4L [place] and sat increased to 90%. Will continue to monitor. Randall Cook

## 2019-09-29 LAB — CBC WITH DIFFERENTIAL/PLATELET
Abs Immature Granulocytes: 0.02 10*3/uL (ref 0.00–0.07)
Basophils Absolute: 0 10*3/uL (ref 0.0–0.1)
Basophils Relative: 0 %
Eosinophils Absolute: 0 10*3/uL (ref 0.0–0.5)
Eosinophils Relative: 0 %
HCT: 47.5 % (ref 39.0–52.0)
Hemoglobin: 15.6 g/dL (ref 13.0–17.0)
Immature Granulocytes: 0 %
Lymphocytes Relative: 16 %
Lymphs Abs: 0.9 10*3/uL (ref 0.7–4.0)
MCH: 30.2 pg (ref 26.0–34.0)
MCHC: 32.8 g/dL (ref 30.0–36.0)
MCV: 91.9 fL (ref 80.0–100.0)
Monocytes Absolute: 0.5 10*3/uL (ref 0.1–1.0)
Monocytes Relative: 8 %
Neutro Abs: 4.3 10*3/uL (ref 1.7–7.7)
Neutrophils Relative %: 76 %
Platelets: 191 10*3/uL (ref 150–400)
RBC: 5.17 MIL/uL (ref 4.22–5.81)
RDW: 12.9 % (ref 11.5–15.5)
WBC: 5.7 10*3/uL (ref 4.0–10.5)
nRBC: 0 % (ref 0.0–0.2)

## 2019-09-29 LAB — COMPREHENSIVE METABOLIC PANEL
ALT: 20 U/L (ref 0–44)
AST: 27 U/L (ref 15–41)
Albumin: 3.1 g/dL — ABNORMAL LOW (ref 3.5–5.0)
Alkaline Phosphatase: 43 U/L (ref 38–126)
Anion gap: 11 (ref 5–15)
BUN: 20 mg/dL (ref 8–23)
CO2: 23 mmol/L (ref 22–32)
Calcium: 8.4 mg/dL — ABNORMAL LOW (ref 8.9–10.3)
Chloride: 99 mmol/L (ref 98–111)
Creatinine, Ser: 1.03 mg/dL (ref 0.61–1.24)
GFR calc Af Amer: >60 mL/min (ref >60)
GFR calc non Af Amer: >60 mL/min (ref >60)
Glucose, Bld: 113 mg/dL — ABNORMAL HIGH (ref 70–99)
Potassium: 4 mmol/L (ref 3.5–5.1)
Sodium: 133 mmol/L — ABNORMAL LOW (ref 135–145)
Total Bilirubin: 0.4 mg/dL (ref 0.3–1.2)
Total Protein: 6.6 g/dL (ref 6.5–8.1)

## 2019-09-29 LAB — C-REACTIVE PROTEIN: CRP: 4.4 mg/dL — ABNORMAL HIGH (ref <1.0)

## 2019-09-29 LAB — D-DIMER, QUANTITATIVE: D-Dimer, Quant: 0.89 ug/mL-FEU — ABNORMAL HIGH (ref 0.00–0.50)

## 2019-09-29 NOTE — Progress Notes (Signed)
PROGRESS NOTE  Randall Cook  DOB: 17-Feb-1956  PCP: Kerin Perna, NP DGL:[id number]  DOA: 09/27/2019 Admitted From: Home  LOS: 2 days   Chief Complaint  Patient presents with  . covid positive  . Shortness of Breath   Brief narrative: Randall Cook is a 64 y.o. male with PMH of hypertension Patient presented to the ED on 09/27/2019 from home along with his wife both of who are Covid positive on 1/1.  Patient had worsening SOB, cough with upper abd pain.  In the ED, patient was noted to be hypoxic and required supplemental oxygen at 2 L/min. WBC count 5.9 CRP 4.1 CXR with RUL opacity.  Subjective: Patient was seen and examined this morning.  Pleasant middle-aged African-American male. Not in distress on oxygen supplementation.  Assessment/Plan: COVID pneumonia Acute respiratory failure with hypoxia  -Presented with worsening shortness of breath, cough -Chest imaging -right upper lobe opacity. -Patient is requiring high flow oxygen this morning at 5 L/min despite improving serum biomarkers.  He clinically feels better. -Treatment: Decadron 6 mg daily for 10 days, IV Remdesivir for 5 days to complete on 1/7,  -Supportive care: Vitamin C, Zinc, inhalers, Tylenol, Antitussives - benzonatate, Mucinex -Oxygen - SpO2: 94 % O2 Flow Rate (L/min): 5 L/min -Continue airborne/contact isolation precautions. -Labs and biomarker trend as below showing improvement.   Recent Labs  Lab 09/27/19 1614 09/28/19 0436 09/29/19 0316  WBC 5.9 4.4 5.7   Recent Labs    09/27/19 1614 09/28/19 0436 09/29/19 0316  DDIMER 1.12* 1.08* 0.89*  FERRITIN 660*  --   --   CRP 4.1* 5.4* 4.4*   Hypertension -Blood pressure controlled on HCTZ, losartan.  HLD -On lovastatin at home.  Mobility: Encourage ambulation in the room.May need PT eval once oxygen requirement improves Diet: Cardiac diet Fluid: None DVT prophylaxis:  Lovenox Code Status:  Full code Family Communication:  Wife is  hospitalized as well with Covid.  Discussed with patient, his wife and daughter about the treatment plan of both patients. Expected Discharge:  Continue inpatient care  Consultants:  None  Procedures:  None  Antimicrobials: Anti-infectives (From admission, onward)   Start     Dose/Rate Route Frequency Ordered Stop   09/28/19 1000  remdesivir 100 mg in sodium chloride 0.9 % 100 mL IVPB     100 mg 200 mL/hr over 30 Minutes Intravenous Daily 09/27/19 1922 10/02/19 0959   09/27/19 2030  remdesivir 200 mg in sodium chloride 0.9% 250 mL IVPB     200 mg 580 mL/hr over 30 Minutes Intravenous Once 09/27/19 1922 09/27/19 2242        Code Status: Full Code   Diet Order            Diet Heart Room service appropriate? Yes; Fluid consistency: Thin  Diet effective now              Infusions:  . remdesivir 100 mg in NS 100 mL 100 mg (09/29/19 0847)    Scheduled Meds: . albuterol  2 puff Inhalation Q6H  . dexamethasone  6 mg Oral Daily  . enoxaparin (LOVENOX) injection  40 mg Subcutaneous Q24H  . hydrochlorothiazide  25 mg Oral Daily  . losartan  50 mg Oral Daily  . mouth rinse  15 mL Mouth Rinse BID    PRN meds: acetaminophen, chlorpheniramine-HYDROcodone, guaiFENesin-dextromethorphan, ondansetron **OR** ondansetron (ZOFRAN) IV   Objective: Vitals:   09/28/19 2105 09/29/19 0536  BP: 119/88 127/88  Pulse: 85 85  Resp: 20 (!) 22  Temp: 97.7 F (36.5 C) 98.6 F (37 C)  SpO2: 90% 94%    Intake/Output Summary (Last 24 hours) at 09/29/2019 1208 Last data filed at 09/29/2019 0951 Gross per 24 hour  Intake 240 ml  Output 950 ml  Net -710 ml   Filed Weights   09/28/19 0132  Weight: 107.3 kg   Weight change:  Body mass index is 31.21 kg/m.   Physical Exam: General exam: Appears calm and comfortable.  Not in distress. Skin: No rashes, lesions or ulcers. HEENT: Atraumatic, normocephalic, supple neck, no obvious bleeding Lungs: Clear to auscultation bilaterally CVS:  Regular rate and rhythm, no murmur GI/Abd soft, nontender, nondistended, bowel sound present CNS: Alert, awake monitor x3 Psychiatry: Mood appropriate Extremities: No pedal edema, no calf tenderness  Data Review: I have personally reviewed the laboratory data and studies available.  Recent Labs  Lab 09/27/19 1614 09/28/19 0436 09/29/19 0316  WBC 5.9 4.4 5.7  NEUTROABS 5.0 3.7 4.3  HGB 16.8 15.5 15.6  HCT 50.1 46.8 47.5  MCV 92.9 93.2 91.9  PLT 162 162 191   Recent Labs  Lab 09/27/19 1614 09/28/19 0436 09/29/19 0316  NA 133* 135 133*  K 4.2 4.3 4.0  CL 99 101 99  CO2 23 24 23   GLUCOSE 107* 127* 113*  BUN 14 13 20   CREATININE 1.19 1.06 1.03  CALCIUM 8.4* 8.2* 8.4*    , MD  [facility] 09/29/2019

## 2019-09-29 NOTE — TOC Progression Note (Signed)
Transition of Care [facility]) - Progression Note    Patient Details  Name: Randall Cook MRN: [id number] Date of Birth: Oct 18, 1955  [facility]) CM/SW Contact  Geni Bers, RN Phone Number: 09/29/2019, 12:03 PM  Clinical Narrative:    Both pt and wife are admitted with COVID from home.    Expected Discharge Plan: Home w Home Health Services Barriers to Discharge: No Barriers Identified  Expected Discharge Plan and Services Expected Discharge Plan: Home w Home Health Services       Living arrangements for the past 2 months: Single Family Home                                       Social Determinants of Health (SDOH) Interventions    Readmission Risk Interventions No flowsheet data found.

## 2019-09-30 LAB — CBC WITH DIFFERENTIAL/PLATELET
Abs Immature Granulocytes: 0.04 10*3/uL (ref 0.00–0.07)
Basophils Absolute: 0 10*3/uL (ref 0.0–0.1)
Basophils Relative: 0 %
Eosinophils Absolute: 0 10*3/uL (ref 0.0–0.5)
Eosinophils Relative: 0 %
HCT: 47.9 % (ref 39.0–52.0)
Hemoglobin: 15.8 g/dL (ref 13.0–17.0)
Immature Granulocytes: 1 %
Lymphocytes Relative: 14 %
Lymphs Abs: 1.2 10*3/uL (ref 0.7–4.0)
MCH: 30.3 pg (ref 26.0–34.0)
MCHC: 33 g/dL (ref 30.0–36.0)
MCV: 91.8 fL (ref 80.0–100.0)
Monocytes Absolute: 0.6 10*3/uL (ref 0.1–1.0)
Monocytes Relative: 8 %
Neutro Abs: 6.3 10*3/uL (ref 1.7–7.7)
Neutrophils Relative %: 77 %
Platelets: 191 10*3/uL (ref 150–400)
RBC: 5.22 MIL/uL (ref 4.22–5.81)
RDW: 12.9 % (ref 11.5–15.5)
WBC: 8.2 10*3/uL (ref 4.0–10.5)
nRBC: 0 % (ref 0.0–0.2)

## 2019-09-30 LAB — COMPREHENSIVE METABOLIC PANEL
ALT: 24 U/L (ref 0–44)
AST: 33 U/L (ref 15–41)
Albumin: 3.1 g/dL — ABNORMAL LOW (ref 3.5–5.0)
Alkaline Phosphatase: 42 U/L (ref 38–126)
Anion gap: 13 (ref 5–15)
BUN: 26 mg/dL — ABNORMAL HIGH (ref 8–23)
CO2: 23 mmol/L (ref 22–32)
Calcium: 8.5 mg/dL — ABNORMAL LOW (ref 8.9–10.3)
Chloride: 97 mmol/L — ABNORMAL LOW (ref 98–111)
Creatinine, Ser: 1.09 mg/dL (ref 0.61–1.24)
GFR calc Af Amer: >60 mL/min (ref >60)
GFR calc non Af Amer: >60 mL/min (ref >60)
Glucose, Bld: 102 mg/dL — ABNORMAL HIGH (ref 70–99)
Potassium: 4.5 mmol/L (ref 3.5–5.1)
Sodium: 133 mmol/L — ABNORMAL LOW (ref 135–145)
Total Bilirubin: 0.8 mg/dL (ref 0.3–1.2)
Total Protein: 6.9 g/dL (ref 6.5–8.1)

## 2019-09-30 LAB — C-REACTIVE PROTEIN: CRP: 2 mg/dL — ABNORMAL HIGH (ref <1.0)

## 2019-09-30 LAB — D-DIMER, QUANTITATIVE: D-Dimer, Quant: 0.82 ug/mL-FEU — ABNORMAL HIGH (ref 0.00–0.50)

## 2019-09-30 MED ORDER — SALINE SPRAY 0.65 % NA SOLN
1.0000 | NASAL | Status: DC | PRN
  Filled 2019-09-30: qty 44

## 2019-09-30 MED ORDER — TOCILIZUMAB 400 MG/20ML IV SOLN
800.0000 mg | Freq: Once | INTRAVENOUS | Status: AC
  Administered 2019-09-30: 12:00:00 800 mg via INTRAVENOUS
  Filled 2019-09-30: qty 40

## 2019-09-30 NOTE — Progress Notes (Signed)
Report called to [facility]. Randall Cook

## 2019-09-30 NOTE — Progress Notes (Addendum)
PROGRESS NOTE  Randall Cook  DOB: 06/04/1956  PCP: Kerin Perna, NP CHE:[id number]  DOA: 09/27/2019 Admitted From: Home  LOS: 3 days   Chief Complaint  Patient presents with  . covid positive  . Shortness of Breath   Brief narrative: Randall Cook is a 64 y.o. male with PMH of hypertension Patient presented to the ED on 09/27/2019 from home along with his wife both of who are Covid positive on 1/1.  Patient had worsening SOB, cough with upper abd pain.  In the ED, patient was noted to be hypoxic and required supplemental oxygen at 2 L/min. WBC count 5.9 CRP 4.1 CXR with RUL opacity.  Subjective: Patient was seen and examined this morning.  Pleasant middle-aged African-American male. Not in distress on oxygen supplementation.  Assessment/Plan: COVID pneumonia Acute respiratory failure with hypoxia  -Presented with worsening shortness of breath, cough -Chest imaging -right upper lobe opacity. -Patient has been on IV Decadron and IV remdesivir.  In last 24 hours, his oxygen requirement has significantly increased from 5 L/min to 15 L/min. -At the same time, CRP level is improved and is less than 7. Although his CRP level is less than 7, it could be falsely low because of steroids.  Based on his worsening oxygen requirement, I think patient will benefit from 1 dose of Actemra.  -I discussed with patient and his wife about Actemra and got the consent.  Since patient is wife is also currently being treated for Covid and is improving with Actemra, patient has a positive feeling about it. -Treatment: 1 dose Actemra to be given today.  Decadron 6 mg daily for 10 days, IV Remdesivir for 5 days to complete on 1/7,  -Supportive care: Vitamin C, Zinc, inhalers, Tylenol, Antitussives - benzonatate, Mucinex -Oxygen - SpO2: 90 %(88-90%) O2 Flow Rate (L/min): 15 L/min -Continue airborne/contact isolation precautions.   Recent Labs  Lab 09/27/19 1614 09/28/19 0436 09/29/19 0316  09/30/19 0314  WBC 5.9 4.4 5.7 8.2   Recent Labs    09/27/19 1614 09/28/19 0436 09/29/19 0316 09/30/19 0314  DDIMER 1.12* 1.08* 0.89* 0.82*  FERRITIN 660*  --   --   --   CRP 4.1* 5.4* 4.4* 2.0*   Hypertension -Blood pressure controlled on HCTZ, losartan.  HLD -On lovastatin at home.  Mobility: Encourage ambulation in the room.May need PT eval once oxygen requirement improves Diet: Cardiac diet Fluid: None DVT prophylaxis:  Lovenox Code Status:  Full code Family Communication:  Complicated with patient and his wife.  Could not connect to patient's daughter today.   Expected Discharge:  Continue inpatient care  Consultants:  None  Procedures:  None  Antimicrobials: Anti-infectives (From admission, onward)   Start     Dose/Rate Route Frequency Ordered Stop   09/28/19 1000  remdesivir 100 mg in sodium chloride 0.9 % 100 mL IVPB     100 mg 200 mL/hr over 30 Minutes Intravenous Daily 09/27/19 1922 10/02/19 0959   09/27/19 2030  remdesivir 200 mg in sodium chloride 0.9% 250 mL IVPB     200 mg 580 mL/hr over 30 Minutes Intravenous Once 09/27/19 1922 09/27/19 2242        Code Status: Full Code   Diet Order            Diet Heart Room service appropriate? Yes; Fluid consistency: Thin  Diet effective now              Infusions:  . remdesivir 100 mg in NS  100 mL 100 mg (09/30/19 1035)  . tocilizumab (ACTEMRA) - non-COVID treatment      Scheduled Meds: . albuterol  2 puff Inhalation Q6H  . dexamethasone  6 mg Oral Daily  . enoxaparin (LOVENOX) injection  40 mg Subcutaneous Q24H  . hydrochlorothiazide  25 mg Oral Daily  . losartan  50 mg Oral Daily  . mouth rinse  15 mL Mouth Rinse BID    PRN meds: acetaminophen, chlorpheniramine-HYDROcodone, guaiFENesin-dextromethorphan, ondansetron **OR** ondansetron (ZOFRAN) IV   Objective: Vitals:   09/30/19 0520 09/30/19 0854  BP: 117/86   Pulse: (!) 108   Resp: 20   Temp: 98.5 F (36.9 C)   SpO2: 90% 90%     Intake/Output Summary (Last 24 hours) at 09/30/2019 1058 Last data filed at 09/30/2019 0500 Gross per 24 hour  Intake 780 ml  Output 1750 ml  Net -970 ml   Filed Weights   09/28/19 0132  Weight: 107.3 kg   Weight change:  Body mass index is 31.21 kg/m.   Physical Exam: General exam: Seems tired with labored breathing today. Skin: No rashes, lesions or ulcers. HEENT: Atraumatic, normocephalic, supple neck, no obvious bleeding Lungs: Clear to auscultation bilaterally, no wheezing, crackles. CVS: Regular rate and rhythm, no murmur GI/Abd soft, nontender, nondistended, bowel sound present CNS: Alert, awake monitor x3 Psychiatry: Mood appropriate Extremities: No pedal edema, no calf tenderness  Data Review: I have personally reviewed the laboratory data and studies available.  Recent Labs  Lab 09/27/19 1614 09/28/19 0436 09/29/19 0316 09/30/19 0314  WBC 5.9 4.4 5.7 8.2  NEUTROABS 5.0 3.7 4.3 6.3  HGB 16.8 15.5 15.6 15.8  HCT 50.1 46.8 47.5 47.9  MCV 92.9 93.2 91.9 91.8  PLT 162 162 191 191   Recent Labs  Lab 09/27/19 1614 09/28/19 0436 09/29/19 0316 09/30/19 0314  NA 133* 135 133* 133*  K 4.2 4.3 4.0 4.5  CL 99 101 99 97*  CO2 23 24 23 23   GLUCOSE 107* 127* 113* 102*  BUN 14 13 20  26*  CREATININE 1.19 1.06 1.03 1.09  CALCIUM 8.4* 8.2* 8.4* 8.5*    , MD  [facility] 09/30/2019

## 2019-09-30 NOTE — Plan of Care (Signed)
  Problem: Clinical Measurements: Goal: Ability to maintain clinical measurements within normal limits will improve Outcome: Progressing Goal: Respiratory complications will improve Outcome: Progressing Goal: Cardiovascular complication will be avoided Outcome: Progressing   Problem: Coping: Goal: Level of anxiety will decrease Outcome: Progressing   

## 2019-09-30 NOTE — Plan of Care (Signed)
  Problem: Clinical Measurements: Goal: Ability to maintain clinical measurements within normal limits will improve Outcome: Progressing Goal: Will remain free from infection Outcome: Progressing Goal: Diagnostic test results will improve Outcome: Progressing Goal: Respiratory complications will improve Outcome: Progressing Goal: Cardiovascular complication will be avoided Outcome: Progressing   Problem: Nutrition: Goal: Adequate nutrition will be maintained Outcome: Progressing   Problem: Coping: Goal: Level of anxiety will decrease Outcome: Progressing   Problem: Elimination: Goal: Will not experience complications related to bowel motility Outcome: Progressing Goal: Will not experience complications related to urinary retention Outcome: Progressing   Problem: Pain Managment: Goal: General experience of comfort will improve Outcome: Progressing   Problem: Safety: Goal: Ability to remain free from injury will improve Outcome: Progressing   Problem: Skin Integrity: Goal: Risk for impaired skin integrity will decrease Outcome: Progressing   Problem: Education: Goal: Knowledge of risk factors and measures for prevention of condition will improve Outcome: Progressing   Problem: Coping: Goal: Psychosocial and spiritual needs will be supported Outcome: Progressing   Problem: Respiratory: Goal: Will maintain a patent airway Outcome: Progressing Goal: Complications related to the disease process, condition or treatment will be avoided or minimized Outcome: Progressing   

## 2019-10-01 LAB — CBC WITH DIFFERENTIAL/PLATELET
Abs Immature Granulocytes: 0.03 10*3/uL (ref 0.00–0.07)
Basophils Absolute: 0 10*3/uL (ref 0.0–0.1)
Basophils Relative: 1 %
Eosinophils Absolute: 0 10*3/uL (ref 0.0–0.5)
Eosinophils Relative: 0 %
HCT: 49.8 % (ref 39.0–52.0)
Hemoglobin: 16.7 g/dL (ref 13.0–17.0)
Immature Granulocytes: 1 %
Lymphocytes Relative: 15 %
Lymphs Abs: 0.8 10*3/uL (ref 0.7–4.0)
MCH: 30.7 pg (ref 26.0–34.0)
MCHC: 33.5 g/dL (ref 30.0–36.0)
MCV: 91.5 fL (ref 80.0–100.0)
Monocytes Absolute: 0.6 10*3/uL (ref 0.1–1.0)
Monocytes Relative: 10 %
Neutro Abs: 4.2 10*3/uL (ref 1.7–7.7)
Neutrophils Relative %: 73 %
Platelets: 252 10*3/uL (ref 150–400)
RBC: 5.44 MIL/uL (ref 4.22–5.81)
RDW: 13.2 % (ref 11.5–15.5)
WBC: 5.7 10*3/uL (ref 4.0–10.5)
nRBC: 0 % (ref 0.0–0.2)

## 2019-10-01 LAB — COMPREHENSIVE METABOLIC PANEL
ALT: 26 U/L (ref 0–44)
AST: 30 U/L (ref 15–41)
Albumin: 3.3 g/dL — ABNORMAL LOW (ref 3.5–5.0)
Alkaline Phosphatase: 47 U/L (ref 38–126)
Anion gap: 14 (ref 5–15)
BUN: 24 mg/dL — ABNORMAL HIGH (ref 8–23)
CO2: 23 mmol/L (ref 22–32)
Calcium: 8.7 mg/dL — ABNORMAL LOW (ref 8.9–10.3)
Chloride: 96 mmol/L — ABNORMAL LOW (ref 98–111)
Creatinine, Ser: 1.13 mg/dL (ref 0.61–1.24)
GFR calc Af Amer: >60 mL/min (ref >60)
GFR calc non Af Amer: >60 mL/min (ref >60)
Glucose, Bld: 111 mg/dL — ABNORMAL HIGH (ref 70–99)
Potassium: 4.5 mmol/L (ref 3.5–5.1)
Sodium: 133 mmol/L — ABNORMAL LOW (ref 135–145)
Total Bilirubin: 0.5 mg/dL (ref 0.3–1.2)
Total Protein: 7.3 g/dL (ref 6.5–8.1)

## 2019-10-01 LAB — BRAIN NATRIURETIC PEPTIDE: B Natriuretic Peptide: 17.8 pg/mL (ref 0.0–100.0)

## 2019-10-01 LAB — D-DIMER, QUANTITATIVE: D-Dimer, Quant: 0.87 ug/mL-FEU — ABNORMAL HIGH (ref 0.00–0.50)

## 2019-10-01 LAB — C-REACTIVE PROTEIN: CRP: 3.7 mg/dL — ABNORMAL HIGH (ref <1.0)

## 2019-10-01 LAB — MAGNESIUM: Magnesium: 2.2 mg/dL (ref 1.7–2.4)

## 2019-10-01 LAB — PROCALCITONIN: Procalcitonin: <0.1 ng/mL

## 2019-10-01 NOTE — Plan of Care (Signed)
Pt A&Ox4. VSS, SpO2 >88% on RA. C/o HA,. Given PRN tylenol & tussinex w/ positive effect. Utilizing urinal at bedside appropriately.   Brought to 168 to visit w/ wife  OOBTC for majoroity of shift, tolerated well  Berneice Heinrich    Problem: Clinical Measurements: Goal: Ability to maintain clinical measurements within normal limits will improve Outcome: Progressing Goal: Will remain free from infection Outcome: Progressing Goal: Diagnostic test results will improve Outcome: Progressing Goal: Respiratory complications will improve Outcome: Progressing Goal: Cardiovascular complication will be avoided Outcome: Progressing   Problem: Nutrition: Goal: Adequate nutrition will be maintained Outcome: Progressing   Problem: Coping: Goal: Level of anxiety will decrease Outcome: Progressing   Problem: Elimination: Goal: Will not experience complications related to bowel motility Outcome: Progressing Goal: Will not experience complications related to urinary retention Outcome: Progressing   Problem: Pain Managment: Goal: General experience of comfort will improve Outcome: Progressing   Problem: Safety: Goal: Ability to remain free from injury will improve Outcome: Progressing   Problem: Skin Integrity: Goal: Risk for impaired skin integrity will decrease Outcome: Progressing   Problem: Education: Goal: Knowledge of risk factors and measures for prevention of condition will improve Outcome: Progressing   Problem: Coping: Goal: Psychosocial and spiritual needs will be supported Outcome: Progressing   Problem: Respiratory: Goal: Will maintain a patent airway Outcome: Progressing Goal: Complications related to the disease process, condition or treatment will be avoided or minimized Outcome: Progressing

## 2019-10-01 NOTE — Progress Notes (Signed)
PROGRESS NOTE  Randall Cook  GHW:[id number] DOB: 02-08-1956 DOA: 09/27/2019 PCP: Kerin Perna, NP   Brief Narrative: Randall Cook is a 64 y.o. male with PMH of hypertension Patient presented to the ED on 09/27/2019 from home along with his wife both of who are Covid positive on 1/1.  Patient had worsening SOB, cough with upper abd pain.  In the ED, patient was noted to be hypoxic and required supplemental oxygen at 2 L/min. WBC count 5.9 CRP 4.1 CXR with RUL opacity.   Assessment & Plan: Active Problems:   Acute hypoxemic respiratory failure due to COVID-19 [facility])  Acute hypoxic respiratory failure due to covid-19 pneumonia: BNP low. PCT undetectable. - Continue remdesivir (5th/final dose today) - Continue steroids for 10 days - Continue weaning oxygen, successfully down to 5-6L O2 today from 15L yesterday.  - s/p tocilizumab 1/5.  - CRP rising slightly today of uncertain significance. Continue trending. - Continue airborne, contact precautions.   - Check daily labs: CBC w/diff, CMP, d-dimer, CRP - Vit C, zinc, IS, FV, OOB, PT/OT, proning recommended.   Obesity: BMI 31.  - Noted  HLD:  - Continue statin  HTN:  - Continue current medications.  DVT prophylaxis: Lovenox Code Status: Full Family Communication: Wife also admitted at [facility] currently Disposition Plan: Pending improvement in hypoxia. PT/OT consulted. OOB as much as possible   Consultants:   None  Procedures:   None  Antimicrobials:  Remdesivir   Subjective: Feels shortness of breath is slightly better than yesterday, worse with exertion. No chest pain.  Objective: Vitals:   09/30/19 2358 09/30/19 2359 10/01/19 0000 10/01/19 0345  BP:   105/76 (!) 129/100  Pulse:   74 91  Resp:   16 18  Temp:   98.9 F (37.2 C) 97.8 F (36.6 C)  TempSrc:   Oral Oral  SpO2: 96% 96% 95% 98%  Weight:      Height:        Intake/Output Summary (Last 24 hours) at 10/01/2019 1015 Last data filed at 10/01/2019  0200 Gross per 24 hour  Intake 680 ml  Output 850 ml  Net -170 ml   Filed Weights   09/28/19 0132 09/30/19 1324  Weight: 107.3 kg 107 kg    Gen: 64 y.o. male in no distress  Pulm: Non-labored breathing supplemental oxygen. Clear to auscultation bilaterally.  CV: Regular rate and rhythm. No murmur, rub, or gallop. No JVD, no pedal edema. GI: Abdomen soft, non-tender, non-distended, with normoactive bowel sounds. No organomegaly or masses felt. Ext: Warm, no deformities Skin: No rashes, lesions or ulcers Neuro: Alert and oriented. No focal neurological deficits. Psych: Judgement and insight appear normal. Mood & affect appropriate.   Data Reviewed: I have personally reviewed following labs and imaging studies  CBC: Recent Labs  Lab 09/27/19 1614 09/28/19 0436 09/29/19 0316 09/30/19 0314 10/01/19 0022  WBC 5.9 4.4 5.7 8.2 5.7  NEUTROABS 5.0 3.7 4.3 6.3 4.2  HGB 16.8 15.5 15.6 15.8 16.7  HCT 50.1 46.8 47.5 47.9 49.8  MCV 92.9 93.2 91.9 91.8 91.5  PLT 162 162 191 191 789   Basic Metabolic Panel: Recent Labs  Lab 09/27/19 1614 09/28/19 0436 09/29/19 0316 09/30/19 0314 10/01/19 0022  NA 133* 135 133* 133* 133*  K 4.2 4.3 4.0 4.5 4.5  CL 99 101 99 97* 96*  CO2 23 24 23 23 23   GLUCOSE 107* 127* 113* 102* 111*  BUN 14 13 20  26* 24*  CREATININE 1.19  1.06 1.03 1.09 1.13  CALCIUM 8.4* 8.2* 8.4* 8.5* 8.7*  MG  --   --   --   --  2.2   GFR: Estimated Creatinine Clearance: 87 mL/min (by C-G formula based on SCr of 1.13 mg/dL). Liver Function Tests: Recent Labs  Lab 09/27/19 1614 09/28/19 0436 09/29/19 0316 09/30/19 0314 10/01/19 0022  AST 33 33 27 33 30  ALT 22 21 20 24 26   ALKPHOS 46 42 43 42 47  BILITOT 0.5 0.4 0.4 0.8 0.5  PROT 7.2 6.7 6.6 6.9 7.3  ALBUMIN 3.4* 3.2* 3.1* 3.1* 3.3*   No results for input(s): LIPASE, AMYLASE in the last 168 hours. No results for input(s): AMMONIA in the last 168 hours. Coagulation Profile: No results for input(s): INR,  PROTIME in the last 168 hours. Cardiac Enzymes: No results for input(s): CKTOTAL, CKMB, CKMBINDEX, TROPONINI in the last 168 hours. BNP (last 3 results) No results for input(s): PROBNP in the last 8760 hours. HbA1C: No results for input(s): HGBA1C in the last 72 hours. CBG: No results for input(s): GLUCAP in the last 168 hours. Lipid Profile: No results for input(s): CHOL, HDL, LDLCALC, TRIG, CHOLHDL, LDLDIRECT in the last 72 hours. Thyroid Function Tests: No results for input(s): TSH, T4TOTAL, FREET4, T3FREE, THYROIDAB in the last 72 hours. Anemia Panel: No results for input(s): VITAMINB12, FOLATE, FERRITIN, TIBC, IRON, RETICCTPCT in the last 72 hours. Urine analysis:    Component Value Date/Time   COLORURINE YELLOW 09/27/2019 2100   APPEARANCEUR CLEAR 09/27/2019 2100   LABSPEC 1.028 09/27/2019 2100   PHURINE 5.0 09/27/2019 2100   GLUCOSEU NEGATIVE 09/27/2019 2100   HGBUR SMALL (A) 09/27/2019 2100   BILIRUBINUR NEGATIVE 09/27/2019 2100   KETONESUR 5 (A) 09/27/2019 2100   PROTEINUR 100 (A) 09/27/2019 2100   UROBILINOGEN 0.2 03/04/2012 1030   NITRITE NEGATIVE 09/27/2019 2100   LEUKOCYTESUR NEGATIVE 09/27/2019 2100   No results found for this or any previous visit (from the past 240 hour(s)).    Radiology Studies: No results found.  Scheduled Meds: . albuterol  2 puff Inhalation Q6H  . dexamethasone  6 mg Oral Daily  . enoxaparin (LOVENOX) injection  40 mg Subcutaneous Q24H  . hydrochlorothiazide  25 mg Oral Daily  . losartan  50 mg Oral Daily  . mouth rinse  15 mL Mouth Rinse BID   Continuous Infusions: . remdesivir 100 mg in NS 100 mL Stopped (09/30/19 1140)     LOS: 4 days   Time spent: 25 minutes.  11/28/19, MD [facility] [email or web address] 10/01/2019, 10:15 AM

## 2019-10-01 NOTE — Evaluation (Signed)
Physical Therapy Evaluation Patient Details Name: Randall Cook MRN: [id number] DOB: [birth year] Today's Date: 10/01/2019   History of Present Illness  Randall Cook is a 64 y.o. male with medical history significant of HTN.  Patient tested positive for COVID on 1/1, now having worsening SOB, cough with upper abd pain  Clinical Impression  The patient received in bed on 3 L HFNC. SAO2 95%, RR 30. Patient  Mobilized  To sitting up on bed edge without assistance. Noted SAO2 90%, RR up to 35. Had planned to ambulate but patient only made it to recliner. RR 39 and SAO2 90% on 3 L. encouraged IS and flutter. Patient should progress to Dc home, although wfe is a patient, reports no other family available. Pt admitted with above diagnosis.  Pt currently with functional limitations due to the deficits listed below (see PT Problem List). Pt will benefit from skilled PT to increase their independence and safety with mobility to allow discharge to the venue listed below.      Follow Up Recommendations Home health PT;Supervision - Intermittent    Equipment Recommendations  None recommended by PT    Recommendations for Other Services       Precautions / Restrictions Precautions Precaution Comments: watch sats, try to wean      Mobility  Bed Mobility Overal bed mobility: Independent                Transfers Overall transfer level: Needs assistance Equipment used: 1 person hand held assist Transfers: Sit to/from Stand;Stand Pivot Transfers Sit to Stand: Min assist Stand pivot transfers: Min assist       General transfer comment: assist to rise from low bed. patient took a few small steps to recliner.  Ambulation/Gait                Stairs            Wheelchair Mobility    Modified Rankin (Stroke Patients Only)       Balance Overall balance assessment: Mild deficits observed, not formally tested                                            Pertinent Vitals/Pain Pain Assessment: Faces Faces Pain Scale: Hurts whole lot Pain Location: right side of chest Pain Descriptors / Indicators: Contraction;Discomfort;Grimacing;Spasm Pain Intervention(s): Monitored during session    Home Living Family/patient expects to be discharged to:: Private residence Living Arrangements: Spouse/significant other   Type of Home: House Home Access: Level entry       Home Equipment: Cane - single point      Prior Function Level of Independence: Independent               Hand Dominance        Extremity/Trunk Assessment   Upper Extremity Assessment Upper Extremity Assessment: Generalized weakness    Lower Extremity Assessment Lower Extremity Assessment: Generalized weakness    Cervical / Trunk Assessment Cervical / Trunk Assessment: Normal  Communication   Communication: No difficulties  Cognition Arousal/Alertness: Awake/alert Behavior During Therapy: WFL for tasks assessed/performed;Flat affect Overall Cognitive Status: Within Functional Limits for tasks assessed                                        General  Comments      Exercises Other Exercises Other Exercises: Encouraged IS and Flutter x 10   Assessment/Plan    PT Assessment Patient needs continued PT services  PT Problem List Decreased strength;Decreased mobility;Decreased safety awareness;Decreased knowledge of precautions;Decreased activity tolerance;Decreased balance;Cardiopulmonary status limiting activity;Decreased knowledge of use of DME       PT Treatment Interventions DME instruction;Therapeutic activities;Gait training;Therapeutic exercise;Patient/family education;Functional mobility training;Balance training    PT Goals (Current goals can be found in the Care Plan section)  Acute Rehab PT Goals Patient Stated Goal: to go home PT Goal Formulation: With patient Time For Goal Achievement: 10/15/19 Potential to Achieve  Goals: Good    Frequency Min 3X/week   Barriers to discharge Decreased caregiver support being wife is in hospital    Co-evaluation               AM-PAC PT "6 Clicks" Mobility  Outcome Measure Help needed turning from your back to your side while in a flat bed without using bedrails?: None Help needed moving from lying on your back to sitting on the side of a flat bed without using bedrails?: None Help needed moving to and from a bed to a chair (including a wheelchair)?: A Little Help needed standing up from a chair using your arms (e.g., wheelchair or bedside chair)?: A Little Help needed to walk in hospital room?: A Lot Help needed climbing 3-5 steps with a railing? : A Lot 6 Click Score: 18    End of Session Equipment Utilized During Treatment: Oxygen Activity Tolerance: Patient limited by fatigue Patient left: in chair;with call bell/phone within reach Nurse Communication: Mobility status PT Visit Diagnosis: Difficulty in walking, not elsewhere classified (R26.2);Unsteadiness on feet (R26.81)    Time: 9163-8466 PT Time Calculation (min) (ACUTE ONLY): 24 min   Charges:   PT Evaluation $PT Eval Low Complexity: 1 Low PT Treatments $Therapeutic Activity: 8-22 mins        Tresa Endo PT Acute Rehabilitation Services Pager [phone] Office [phone]   Claretha Cooper 10/01/2019, 2:36 PM

## 2019-10-02 DIAGNOSIS — E785 Hyperlipidemia, unspecified: Secondary | ICD-10-CM

## 2019-10-02 DIAGNOSIS — I10 Essential (primary) hypertension: Secondary | ICD-10-CM

## 2019-10-02 DIAGNOSIS — E669 Obesity, unspecified: Secondary | ICD-10-CM

## 2019-10-02 LAB — COMPREHENSIVE METABOLIC PANEL
ALT: 36 U/L (ref 0–44)
AST: 30 U/L (ref 15–41)
Albumin: 3.5 g/dL (ref 3.5–5.0)
Alkaline Phosphatase: 46 U/L (ref 38–126)
Anion gap: 13 (ref 5–15)
BUN: 30 mg/dL — ABNORMAL HIGH (ref 8–23)
CO2: 26 mmol/L (ref 22–32)
Calcium: 8.5 mg/dL — ABNORMAL LOW (ref 8.9–10.3)
Chloride: 93 mmol/L — ABNORMAL LOW (ref 98–111)
Creatinine, Ser: 1.25 mg/dL — ABNORMAL HIGH (ref 0.61–1.24)
GFR calc Af Amer: >60 mL/min (ref >60)
GFR calc non Af Amer: >60 mL/min (ref >60)
Glucose, Bld: 117 mg/dL — ABNORMAL HIGH (ref 70–99)
Potassium: 4.2 mmol/L (ref 3.5–5.1)
Sodium: 132 mmol/L — ABNORMAL LOW (ref 135–145)
Total Bilirubin: 0.7 mg/dL (ref 0.3–1.2)
Total Protein: 7.3 g/dL (ref 6.5–8.1)

## 2019-10-02 LAB — C-REACTIVE PROTEIN: CRP: 1.9 mg/dL — ABNORMAL HIGH (ref <1.0)

## 2019-10-02 NOTE — Progress Notes (Signed)
Pt had no complaints this shift. On room air,  Standby assist to BR w/o walker.  Pt was slightly unsteady.  Returned to bed, O2 sats on return 88%, pt states that he still feels pretty weak after ambulating.  Informed of notes in the chart of possible discharge today, pt was unaware of any plans for discharge at this point.  Assured pt that may not be the plan and is dependant on pt recovery status today.

## 2019-10-02 NOTE — Progress Notes (Signed)
PROGRESS NOTE  Randall Cook  HGD:[id number] DOB: [birth year] DOA: 09/27/2019 PCP: Grayce Sessions, NP   Brief Narrative: Randall Cook is a 64 y.o. male with PMH of hypertension Patient presented to the ED on 09/27/2019 from home along with his wife both of who are Covid positive on 1/1.  Patient had worsening SOB, cough with upper abd pain.  In the ED, patient was noted to be hypoxic and required supplemental oxygen at 2 L/min. WBC count 5.9 CRP 4.1 CXR with RUL opacity.  Assessment & Plan: Principal Problem:   Acute hypoxemic respiratory failure due to COVID-19 [facility]) Active Problems:   Hyperlipidemia   Hypertension   Obesity (BMI 30.0-34.9)  Acute hypoxic respiratory failure due to covid-19 pneumonia: BNP low. PCT undetectable. Antigen positive on 09/25/2019 - Completed remdesivir 1/7 - Continue steroids for 10 days - Continue weaning oxygen, down to 2L with exertion.  - s/p tocilizumab 1/5.  - CRP rising slightly today of uncertain significance. Continue trending. - Continue airborne, contact precautions.   - Check CRP, slightly better today, still elevated. Monitor creatinine as well.  - Vit C, zinc, IS, FV, OOB, PT/OT, proning recommended.   Obesity: BMI 31.  - Noted  HLD:  - Continue statin  HTN:  - Continue current medications.  DVT prophylaxis: Lovenox Code Status: Full Family Communication: Wife also admitted at [facility] currently Disposition Plan: Pending improvement in hypoxia, may be eligible for discharge 1/9.   Consultants:   None  Procedures:   None  Antimicrobials:  Remdesivir   Subjective: Shortness of breath has improved significantly, still moderately short of breath with hypoxia while standing with concomitant palpitations and regular tachycardia noted by therapy. Slept ok, eating ok. Generally feels better. Has no regular help at home.  Objective: Vitals:   10/01/19 2104 10/02/19 0308 10/02/19 0743 10/02/19 1200  BP: 110/82 104/77 (!)  117/93 112/66  Pulse: 92 77 72 (!) 103  Resp: (!) 21 20 (!) 27 (!) 29  Temp: 97.8 F (36.6 C) 97.9 F (36.6 C) 98.2 F (36.8 C) 98 F (36.7 C)  TempSrc: Oral Oral Oral Oral  SpO2: 94% 94% 97% 100%  Weight:      Height:        Intake/Output Summary (Last 24 hours) at 10/02/2019 1259 Last data filed at 10/01/2019 2000 Gross per 24 hour  Intake 240 ml  Output 300 ml  Net -60 ml   Filed Weights   09/28/19 0132 09/30/19 1324  Weight: 107.3 kg 107 kg   Gen: 64 y.o. male in no distress Pulm: Nonlabored breathing 2L O2. Clear. CV: Regular rate and rhythm. No murmur, rub, or gallop. No JVD, no dependent edema. GI: Abdomen soft, non-tender, non-distended, with normoactive bowel sounds.  Ext: Warm, no deformities Skin: No rashes, lesions or ulcers on visualized skin. Neuro: Alert and oriented. No focal neurological deficits. Psych: Judgement and insight appear fair. Mood euthymic & affect congruent. Behavior is appropriate.    Data Reviewed: I have personally reviewed following labs and imaging studies  CBC: Recent Labs  Lab 09/27/19 1614 09/28/19 0436 09/29/19 0316 09/30/19 0314 10/01/19 0022  WBC 5.9 4.4 5.7 8.2 5.7  NEUTROABS 5.0 3.7 4.3 6.3 4.2  HGB 16.8 15.5 15.6 15.8 16.7  HCT 50.1 46.8 47.5 47.9 49.8  MCV 92.9 93.2 91.9 91.8 91.5  PLT 162 162 191 191 252   Basic Metabolic Panel: Recent Labs  Lab 09/28/19 0436 09/29/19 0316 09/30/19 0314 10/01/19 0022 10/02/19 0243  NA  135 133* 133* 133* 132*  K 4.3 4.0 4.5 4.5 4.2  CL 101 99 97* 96* 93*  CO2 24 23 23 23 26   GLUCOSE 127* 113* 102* 111* 117*  BUN 13 20 26* 24* 30*  CREATININE 1.06 1.03 1.09 1.13 1.25*  CALCIUM 8.2* 8.4* 8.5* 8.7* 8.5*  MG  --   --   --  2.2  --    GFR: Estimated Creatinine Clearance: 78.6 mL/min (A) (by C-G formula based on SCr of 1.25 mg/dL (H)). Liver Function Tests: Recent Labs  Lab 09/28/19 0436 09/29/19 0316 09/30/19 0314 10/01/19 0022 10/02/19 0243  AST 33 27 33 30 30  ALT  21 20 24 26  36  ALKPHOS 42 43 42 47 46  BILITOT 0.4 0.4 0.8 0.5 0.7  PROT 6.7 6.6 6.9 7.3 7.3  ALBUMIN 3.2* 3.1* 3.1* 3.3* 3.5   No results for input(s): LIPASE, AMYLASE in the last 168 hours. No results for input(s): AMMONIA in the last 168 hours. Coagulation Profile: No results for input(s): INR, PROTIME in the last 168 hours. Cardiac Enzymes: No results for input(s): CKTOTAL, CKMB, CKMBINDEX, TROPONINI in the last 168 hours. BNP (last 3 results) No results for input(s): PROBNP in the last 8760 hours. HbA1C: No results for input(s): HGBA1C in the last 72 hours. CBG: No results for input(s): GLUCAP in the last 168 hours. Lipid Profile: No results for input(s): CHOL, HDL, LDLCALC, TRIG, CHOLHDL, LDLDIRECT in the last 72 hours. Thyroid Function Tests: No results for input(s): TSH, T4TOTAL, FREET4, T3FREE, THYROIDAB in the last 72 hours. Anemia Panel: No results for input(s): VITAMINB12, FOLATE, FERRITIN, TIBC, IRON, RETICCTPCT in the last 72 hours. Urine analysis:    Component Value Date/Time   COLORURINE YELLOW 09/27/2019 2100   APPEARANCEUR CLEAR 09/27/2019 2100   LABSPEC 1.028 09/27/2019 2100   PHURINE 5.0 09/27/2019 2100   GLUCOSEU NEGATIVE 09/27/2019 2100   HGBUR SMALL (A) 09/27/2019 2100   BILIRUBINUR NEGATIVE 09/27/2019 2100   KETONESUR 5 (A) 09/27/2019 2100   PROTEINUR 100 (A) 09/27/2019 2100   UROBILINOGEN 0.2 03/04/2012 1030   NITRITE NEGATIVE 09/27/2019 2100   LEUKOCYTESUR NEGATIVE 09/27/2019 2100   No results found for this or any previous visit (from the past 240 hour(s)).    Radiology Studies: No results found.  Scheduled Meds: . albuterol  2 puff Inhalation Q6H  . dexamethasone  6 mg Oral Daily  . enoxaparin (LOVENOX) injection  40 mg Subcutaneous Q24H  . hydrochlorothiazide  25 mg Oral Daily  . losartan  50 mg Oral Daily  . mouth rinse  15 mL Mouth Rinse BID   Continuous Infusions:    LOS: 5 days   Time spent: 25 minutes.  Patrecia Pour, MD  [facility] [email or web address] 10/02/2019, 12:59 PM

## 2019-10-02 NOTE — Plan of Care (Signed)
Pt A&Ox4. VSS, SpO2 >88% on RA. HR elevateed to 130s while ambulating to bathroom & pt felt tired but able to recover once sitting. C/o HA, given PRN tylenol w./ positive effect  Worked w/ PT/OT, ambulated in hall. See notes for details  Visited wife in [facility] [address]. Tolerated movement well  Daughter Liborio Nixon updated on plan of care & pt status   Report will be given to oncoming staff  Berneice Heinrich    Problem: Clinical Measurements: Goal: Ability to maintain clinical measurements within normal limits will improve Outcome: Progressing Goal: Will remain free from infection Outcome: Progressing Goal: Diagnostic test results will improve Outcome: Progressing Goal: Respiratory complications will improve Outcome: Progressing Goal: Cardiovascular complication will be avoided Outcome: Progressing   Problem: Nutrition: Goal: Adequate nutrition will be maintained Outcome: Progressing   Problem: Coping: Goal: Level of anxiety will decrease Outcome: Progressing   Problem: Elimination: Goal: Will not experience complications related to bowel motility Outcome: Progressing Goal: Will not experience complications related to urinary retention Outcome: Progressing   Problem: Pain Managment: Goal: General experience of comfort will improve Outcome: Progressing   Problem: Safety: Goal: Ability to remain free from injury will improve Outcome: Progressing   Problem: Skin Integrity: Goal: Risk for impaired skin integrity will decrease Outcome: Progressing   Problem: Education: Goal: Knowledge of risk factors and measures for prevention of condition will improve Outcome: Progressing   Problem: Coping: Goal: Psychosocial and spiritual needs will be supported Outcome: Progressing   Problem: Respiratory: Goal: Will maintain a patent airway Outcome: Progressing Goal: Complications related to the disease process, condition or treatment will be avoided or minimized Outcome:  Progressing

## 2019-10-02 NOTE — Evaluation (Signed)
Occupational Therapy Evaluation Patient Details Name: ORONDE HALLENBECK MRN: [id number] DOB: 07/23/1956 Today's Date: 10/02/2019    History of Present Illness ARMAN LOY is a 64 y.o. male with medical history significant of HTN.  Patient tested positive for COVID on 1/1, now having worsening SOB, cough with upper abd pain   Clinical Impression   PTA, pt was living with his wife and was independent and working. Pt currently requiring Supervision-Min Guard A for ADLs and functional mobility. Pt presenting with poor activity tolerance as seen by fatigue, increased HR, and decreased SpO2 with need for 2L O2. Pt HR elevating to 140s during seated grooming at sink and toileting. RR elevating to 30s. SpO2 maintaining in 90s on RA till end of oral care and pt dropping to low 80s and required 2L to recover.    Follow Up Recommendations  No OT follow up;Supervision/Assistance - 24 hour    Equipment Recommendations  Tub/shower seat    Recommendations for Other Services PT consult     Precautions / Restrictions Precautions Precautions: Other (comment) Precaution Comments: Watch HR and SpO2      Mobility Bed Mobility Overal bed mobility: Modified Independent             General bed mobility comments: Increased time. HR elevating to 120s from 90s with long sitting  Transfers Overall transfer level: Needs assistance   Transfers: Sit to/from Stand Sit to Stand: Min guard         General transfer comment: Min Guard A for safety    Balance Overall balance assessment: Mild deficits observed, not formally tested                                         ADL either performed or assessed with clinical judgement   ADL Overall ADL's : Needs assistance/impaired Eating/Feeding: Independent;Sitting   Grooming: Oral care;Set up;Supervision/safety;Sitting Grooming Details (indicate cue type and reason): Pt walking to bathroom with Min Guard and SpO2 maintaing in 90s on  RA. HR elevating to 143 and pt requiring to sit at sink. HR staying in 120-130s and pt sititng or orla care. At end of oral care, pt coughing and SpO2 dropping to 80s. Difficulty returning to >85% on RA so returned pt to 2L O2 and he returned to 90s.  Upper Body Bathing: Set up;Supervision/ safety;Sitting   Lower Body Bathing: Min guard;Sit to/from stand   Upper Body Dressing : Set up;Supervision/safety;Sitting   Lower Body Dressing: Min guard;Sit to/from stand   Toilet Transfer: Min guard;Ambulation;Regular Toilet;Grab bars Toilet Transfer Details (indicate cue type and reason): Min Guard A for safety with toilet transfer. NO physical A needed. HR elevating to 133-147 with toileting Toileting- Clothing Manipulation and Hygiene: Supervision/safety;Set up;Sit to/from stand;Sitting/lateral lean       Functional mobility during ADLs: Min guard General ADL Comments: Pt presenting with decreased activity tolerance as seen by fatigue, elevating HR, and decreased SpO2     Vision         Perception     Praxis      Pertinent Vitals/Pain Pain Assessment: No/denies pain     Hand Dominance Right   Extremity/Trunk Assessment Upper Extremity Assessment Upper Extremity Assessment: Generalized weakness   Lower Extremity Assessment Lower Extremity Assessment: Generalized weakness   Cervical / Trunk Assessment Cervical / Trunk Assessment: Normal   Communication Communication Communication: No difficulties   Cognition Arousal/Alertness:  Awake/alert Behavior During Therapy: WFL for tasks assessed/performed;Flat affect Overall Cognitive Status: Within Functional Limits for tasks assessed                                     General Comments  At rest, HR in 80-90s and SpO2 98% on RA. with actiivty, HR elevating to 130-140s and SpO2 dropping into low 80s requiring atleast 2L to maintain SpO2 in 90s. RR elevating to 30s. BP supine at begining of session 117/93. BP in  recliner at end of session 107/84    Exercises     Shoulder Instructions      Home Living Family/patient expects to be discharged to:: Private residence Living Arrangements: Spouse/significant other Available Help at Discharge: Family;Available 24 hours/day(Wife is at [facility] for [facility]) Type of Home: House Home Access: Level entry     Home Layout: One level     Bathroom Shower/Tub: Tub/shower unit;Curtain   Firefighter: Standard     Home Equipment: Cane - single point          Prior Functioning/Environment Level of Independence: Independent        Comments: Works driving a Therapist, nutritional Problem List: Decreased strength;Decreased range of motion;Decreased activity tolerance;Impaired balance (sitting and/or standing);Decreased knowledge of use of DME or AE;Decreased knowledge of precautions;Cardiopulmonary status limiting activity      OT Treatment/Interventions: Self-care/ADL training;Therapeutic exercise;Energy conservation;DME and/or AE instruction;Therapeutic activities;Patient/family education    OT Goals(Current goals can be found in the care plan section) Acute Rehab OT Goals Patient Stated Goal: to go home OT Goal Formulation: With patient Time For Goal Achievement: 10/16/19 Potential to Achieve Goals: Good  OT Frequency: Min 2X/week   Barriers to D/C:            Co-evaluation              AM-PAC OT "6 Clicks" Daily Activity     Outcome Measure Help from another person eating meals?: None Help from another person taking care of personal grooming?: A Little Help from another person toileting, which includes using toliet, bedpan, or urinal?: A Little Help from another person bathing (including washing, rinsing, drying)?: A Little Help from another person to put on and taking off regular upper body clothing?: None Help from another person to put on and taking off regular lower body clothing?: A Little 6 Click Score: 20   End of Session  Equipment Utilized During Treatment: Oxygen Nurse Communication: Mobility status  Activity Tolerance: Patient tolerated treatment well Patient left: in chair;with call bell/phone within reach  OT Visit Diagnosis: Unsteadiness on feet (R26.81);Other abnormalities of gait and mobility (R26.89);Muscle weakness (generalized) (M62.81)                Time: 0175-1025 OT Time Calculation (min): 33 min Charges:  OT General Charges $OT Visit: 1 Visit OT Evaluation $OT Eval Low Complexity: 1 Low OT Treatments $Self Care/Home Management : 8-22 mins  Kamauri Kathol MSOT, OTR/L Acute Rehab Pager: [phone] Office: [phone]  Theodoro Grist Gabe Glace 10/02/2019, 8:23 AM

## 2019-10-02 NOTE — Progress Notes (Signed)
Physical Therapy Treatment Patient Details Name: Randall Cook MRN: [id number] DOB: [birth year] Today's Date: 10/02/2019    History of Present Illness Randall Cook is a 64 y.o. male with medical history significant of HTN.  Patient tested positive for COVID on 1/1, now having worsening SOB, cough with upper abd pain    PT Comments    The  Patient ambulated x 110' with Rw, only lightly used it, on RA, SPO2 > 94%, HR from 101 to 137 within stnading and ambulating to door-15'. HR did not go > 137. Returned to 101 after sitting down.  discussed with patient any challenges to Dc if wife is still a patient. Patient reports daughter can bring food. When DC'd home,Encouraged  Patient to walk, perform his IS and flutter and stay nourished if he is home alone. Continue PT while in acute care.   Follow Up Recommendations  Home health PT;Supervision - Intermittent     Equipment Recommendations  None recommended by PT    Recommendations for Other Services       Precautions / Restrictions Precautions Precaution Comments: Watch HR and SpO2    Mobility  Bed Mobility               General bed mobility comments: in recliner  Transfers     Transfers: Sit to/from Stand Sit to Stand: Supervision         General transfer comment: patient stood and was steady  Ambulation/Gait Ambulation/Gait assistance: Min guard Gait Distance (Feet): 110 Feet Assistive device: Rolling walker (2 wheeled) Gait Pattern/deviations: Step-through pattern Gait velocity: decr   General Gait Details: was lightly supporting on RW   Stairs             Wheelchair Mobility    Modified Rankin (Stroke Patients Only)       Balance Overall balance assessment: Mild deficits observed, not formally tested                                          Cognition Arousal/Alertness: Awake/alert Behavior During Therapy: WFL for tasks assessed/performed;Flat affect                                          Exercises      General Comments        Pertinent Vitals/Pain Pain Assessment: No/denies pain    Home Living                      Prior Function            PT Goals (current goals can now be found in the care plan section) Progress towards PT goals: Progressing toward goals    Frequency    Min 3X/week      PT Plan Current plan remains appropriate    Co-evaluation              AM-PAC PT "6 Clicks" Mobility   Outcome Measure  Help needed turning from your back to your side while in a flat bed without using bedrails?: None Help needed moving from lying on your back to sitting on the side of a flat bed without using bedrails?: None Help needed moving to and from a bed to a chair (including a wheelchair)?: None Help  needed standing up from a chair using your arms (e.g., wheelchair or bedside chair)?: None Help needed to walk in hospital room?: A Little Help needed climbing 3-5 steps with a railing? : A Little 6 Click Score: 22    End of Session   Activity Tolerance: Patient tolerated treatment well Patient left: in chair;with call bell/phone within reach Nurse Communication: Mobility status PT Visit Diagnosis: Difficulty in walking, not elsewhere classified (R26.2);Unsteadiness on feet (R26.81)     Time: 6384-5364 PT Time Calculation (min) (ACUTE ONLY): 15 min  Charges:  $Gait Training: 8-22 mins                     [place] Pager [phone] Office [phone]    Claretha Cooper 10/02/2019, 4:27 PM

## 2019-10-03 LAB — COMPREHENSIVE METABOLIC PANEL
ALT: 36 U/L (ref 0–44)
AST: 28 U/L (ref 15–41)
Albumin: 3.3 g/dL — ABNORMAL LOW (ref 3.5–5.0)
Alkaline Phosphatase: 44 U/L (ref 38–126)
Anion gap: 12 (ref 5–15)
BUN: 36 mg/dL — ABNORMAL HIGH (ref 8–23)
CO2: 23 mmol/L (ref 22–32)
Calcium: 8.5 mg/dL — ABNORMAL LOW (ref 8.9–10.3)
Chloride: 97 mmol/L — ABNORMAL LOW (ref 98–111)
Creatinine, Ser: 1.14 mg/dL (ref 0.61–1.24)
GFR calc Af Amer: >60 mL/min (ref >60)
GFR calc non Af Amer: >60 mL/min (ref >60)
Glucose, Bld: 97 mg/dL (ref 70–99)
Potassium: 4.3 mmol/L (ref 3.5–5.1)
Sodium: 132 mmol/L — ABNORMAL LOW (ref 135–145)
Total Bilirubin: 0.6 mg/dL (ref 0.3–1.2)
Total Protein: 6.9 g/dL (ref 6.5–8.1)

## 2019-10-03 LAB — C-REACTIVE PROTEIN: CRP: 1.1 mg/dL — ABNORMAL HIGH (ref <1.0)

## 2019-10-03 MED ORDER — DEXAMETHASONE 6 MG PO TABS: 6.0000 mg | ORAL_TABLET | Freq: Every day | ORAL | 0 refills | Status: DC

## 2019-10-03 NOTE — Plan of Care (Signed)
  Problem: Clinical Measurements: Goal: Ability to maintain clinical measurements within normal limits will improve 10/03/2019 1110 by Garrison Columbus, RN Outcome: Adequate for Discharge 10/03/2019 0754 by Garrison Columbus, RN Outcome: Progressing Goal: Will remain free from infection 10/03/2019 1110 by Garrison Columbus, RN Outcome: Adequate for Discharge 10/03/2019 0754 by Garrison Columbus, RN Outcome: Progressing Goal: Diagnostic test results will improve 10/03/2019 1110 by Garrison Columbus, RN Outcome: Adequate for Discharge 10/03/2019 0754 by Garrison Columbus, RN Outcome: Progressing Goal: Respiratory complications will improve 10/03/2019 1110 by Garrison Columbus, RN Outcome: Adequate for Discharge 10/03/2019 0754 by Garrison Columbus, RN Outcome: Progressing Goal: Cardiovascular complication will be avoided 10/03/2019 1110 by Garrison Columbus, RN Outcome: Adequate for Discharge 10/03/2019 0754 by Garrison Columbus, RN Outcome: Progressing   Problem: Nutrition: Goal: Adequate nutrition will be maintained 10/03/2019 1110 by Garrison Columbus, RN Outcome: Adequate for Discharge 10/03/2019 0754 by Garrison Columbus, RN Outcome: Progressing   Problem: Coping: Goal: Level of anxiety will decrease 10/03/2019 1110 by Garrison Columbus, RN Outcome: Adequate for Discharge 10/03/2019 0754 by Garrison Columbus, RN Outcome: Progressing   Problem: Elimination: Goal: Will not experience complications related to bowel motility 10/03/2019 1110 by Garrison Columbus, RN Outcome: Adequate for Discharge 10/03/2019 0754 by Garrison Columbus, RN Outcome: Progressing Goal: Will not experience complications related to urinary retention 10/03/2019 1110 by Garrison Columbus, RN Outcome: Adequate for Discharge 10/03/2019 0754 by Garrison Columbus, RN Outcome: Progressing   Problem: Pain Managment: Goal: General experience of comfort will improve 10/03/2019 1110 by Garrison Columbus, RN Outcome: Adequate for Discharge 10/03/2019 0754 by Garrison Columbus, RN Outcome:  Progressing   Problem: Safety: Goal: Ability to remain free from injury will improve 10/03/2019 1110 by Garrison Columbus, RN Outcome: Adequate for Discharge 10/03/2019 0754 by Garrison Columbus, RN Outcome: Progressing   Problem: Skin Integrity: Goal: Risk for impaired skin integrity will decrease 10/03/2019 1110 by Garrison Columbus, RN Outcome: Adequate for Discharge 10/03/2019 0754 by Garrison Columbus, RN Outcome: Progressing   Problem: Education: Goal: Knowledge of risk factors and measures for prevention of condition will improve 10/03/2019 1110 by Garrison Columbus, RN Outcome: Adequate for Discharge 10/03/2019 0754 by Garrison Columbus, RN Outcome: Progressing   Problem: Coping: Goal: Psychosocial and spiritual needs will be supported 10/03/2019 1110 by Garrison Columbus, RN Outcome: Adequate for Discharge 10/03/2019 0754 by Garrison Columbus, RN Outcome: Progressing   Problem: Respiratory: Goal: Will maintain a patent airway 10/03/2019 1110 by Garrison Columbus, RN Outcome: Adequate for Discharge 10/03/2019 0754 by Garrison Columbus, RN Outcome: Progressing Goal: Complications related to the disease process, condition or treatment will be avoided or minimized 10/03/2019 1110 by Garrison Columbus, RN Outcome: Adequate for Discharge 10/03/2019 0754 by Garrison Columbus, RN Outcome: Progressing

## 2019-10-03 NOTE — Plan of Care (Signed)
  Problem: Clinical Measurements: Goal: Ability to maintain clinical measurements within normal limits will improve Outcome: Progressing Goal: Will remain free from infection Outcome: Progressing Goal: Diagnostic test results will improve Outcome: Progressing Goal: Respiratory complications will improve Outcome: Progressing Goal: Cardiovascular complication will be avoided Outcome: Progressing   Problem: Nutrition: Goal: Adequate nutrition will be maintained Outcome: Progressing   Problem: Coping: Goal: Level of anxiety will decrease Outcome: Progressing   Problem: Elimination: Goal: Will not experience complications related to bowel motility Outcome: Progressing Goal: Will not experience complications related to urinary retention Outcome: Progressing   Problem: Pain Managment: Goal: General experience of comfort will improve Outcome: Progressing   Problem: Safety: Goal: Ability to remain free from injury will improve Outcome: Progressing   Problem: Skin Integrity: Goal: Risk for impaired skin integrity will decrease Outcome: Progressing   Problem: Education: Goal: Knowledge of risk factors and measures for prevention of condition will improve Outcome: Progressing   Problem: Coping: Goal: Psychosocial and spiritual needs will be supported Outcome: Progressing   Problem: Respiratory: Goal: Will maintain a patent airway Outcome: Progressing Goal: Complications related to the disease process, condition or treatment will be avoided or minimized Outcome: Progressing   

## 2019-10-03 NOTE — Progress Notes (Signed)
All pt belonging returned prior to discharge.  Pt place in wheelchair with belongings, nothing left in room.  Wheeled to family waiting outside in car.

## 2019-10-03 NOTE — Progress Notes (Signed)
Patient on room air throughout the night, patient up to BR x2 with desats only to 88% on return.  Patient was tachycardic to 130's upon return to bed but back to regular HR after about 45 seconds.  No other concers noted.

## 2019-10-03 NOTE — Discharge Summary (Signed)
Physician Discharge Summary  Randall Cook QMV:[id number] DOB: March 05, 1956 DOA: 09/27/2019  PCP: Grayce SessionsEdwards, Randall P, NP  Admit date: 09/27/2019 Discharge date: 10/03/2019  Admitted From: Home  Disposition: Home   Recommendations for Outpatient Follow-up:  1. Follow up with PCP in 1-2 weeks 2. Please obtain CMP/CBC in one week  Home Health: PT Equipment/Devices: Tub/shower seat Discharge Condition: Stable CODE STATUS: Full Diet recommendation: Heart healthy  Brief/Interim Summary: Randall Cook a 64 y.o.malewith PMH of hypertension, HLD who presented to the [facility] ED on1/3/2021from home along with his wife both of whom were covid positive on 1/1. Patient had worsening SOB, cough with upper abd pain. In the ED, patient was noted to be hypoxic and required supplemental oxygen at 2 L. CXR demonstrated RUL opacity. He and his wife were both admitted, he was placed on decadron and remdesivir but had rising oxygen requirements 1/5 for which tocilizumab was given and [facility]. With continued therapy, hypoxia has resolved and inflammatory markers have sustained improvement. He is near his functional baseline and felt to be safe to be discharged.   Discharge Diagnoses:  Principal Problem:   Acute hypoxemic respiratory failure due to COVID-19 [facility](HCC) Active Problems:   Hyperlipidemia   Hypertension   Obesity (BMI 30.0-34.9)  Acute hypoxic respiratory failure due to covid-19 pneumonia: BNP low. PCT undetectable. Antigen positive on 09/25/2019.  - Completed remdesivir 1/7 - Continue steroids for 10 days - Has been ambulating on room air for 24 hours prior to discharge. Inflammatory markers sustaining improvement.  - s/p tocilizumab 1/5.  - Continue isolation for 21 days from date of positive test (09/25/2019)  Obesity: BMI 31.  - Noted  HLD:  - Continue statin  HTN:  - Continue current medications.  Discharge Instructions Discharge Instructions    Diet - low  sodium heart healthy   Complete by: As directed    Discharge instructions   Complete by: As directed    You are being discharged from the hospital after treatment for covid-19 infection. You are felt to be stable enough to no longer require inpatient monitoring, testing, and treatment, though you will need to follow the recommendations below: - Continue taking decadron for 3 more days (first dose tomorrow morning) - Remain in self-isolation for 21 days following your positive test to reduce risk of transmission of the virus.  - Do not take NSAID medications (including, but not limited to, ibuprofen, advil, motrin, naproxen, aleve, goody's powder, etc.) - Follow up with your doctor in the next week via telehealth or seek medical attention right away if your symptoms get WORSE.  - Consider donating plasma after you have recovered (either 14 days after a negative test or 28 days after symptoms have completely resolved) because your antibodies to this virus may be helpful to give to others with life-threatening infections. Please go to the website www.oneblood.org if you would like to consider volunteering for plasma donation.    Directions for you at home:  Wear a facemask You should wear a facemask that covers your nose and mouth when you are in the same room with other people and when you visit a healthcare provider. People who live with or visit you should also wear a facemask while they are in the same room with you.  Separate yourself from other people in your home As much as possible, you should stay in a different room from other people in your home. Also, you should use a separate bathroom,  if available.  Avoid sharing household items You should not share dishes, drinking glasses, cups, eating utensils, towels, bedding, or other items with other people in your home. After using these items, you should wash them thoroughly with soap and water.  Cover your coughs and sneezes Cover your  mouth and nose with a tissue when you cough or sneeze, or you can cough or sneeze into your sleeve. Throw used tissues in a lined trash can, and immediately wash your hands with soap and water for at least 20 seconds or use an alcohol-based hand rub.  Wash your [facility] your hands often and thoroughly with soap and water for at least 20 seconds. You can use an alcohol-based hand sanitizer if soap and water are not available and if your hands are not visibly dirty. Avoid touching your eyes, nose, and mouth with unwashed hands.  Directions for those who live with, or provide care at home for you:  Limit the number of people who have contact with the patient If possible, have only one caregiver for the patient. Other household members should stay in another home or place of residence. If this is not possible, they should stay in another room, or be separated from the patient as much as possible. Use a separate bathroom, if available. Restrict visitors who do not have an essential need to be in the home.  Ensure good ventilation Make sure that shared spaces in the home have good air flow, such as from an air conditioner or an opened window, weather permitting.  Wash your hands often Wash your hands often and thoroughly with soap and water for at least 20 seconds. You can use an alcohol based hand sanitizer if soap and water are not available and if your hands are not visibly dirty. Avoid touching your eyes, nose, and mouth with unwashed hands. Use disposable paper towels to dry your hands. If not available, use dedicated cloth towels and replace them when they become wet.  Wear a facemask and gloves Wear a disposable facemask at all times in the room and gloves when you touch or have contact with the patient's blood, body fluids, and/or secretions or excretions, such as sweat, saliva, sputum, nasal mucus, vomit, urine, or feces.  Ensure the mask fits over your nose and mouth tightly, and do  not touch it during use. Throw out disposable facemasks and gloves after using them. Do not reuse. Wash your hands immediately after removing your facemask and gloves. If your personal clothing becomes contaminated, carefully remove clothing and launder. Wash your hands after handling contaminated clothing. Place all used disposable facemasks, gloves, and other waste in a lined container before disposing them with other household waste. Remove gloves and wash your hands immediately after handling these items.  Do not share dishes, glasses, or other household items with the patient Avoid sharing household items. You should not share dishes, drinking glasses, cups, eating utensils, towels, bedding, or other items with a patient who is confirmed to have, or being evaluated for, COVID-19 infection. After the person uses these items, you should wash them thoroughly with soap and water.  Wash laundry thoroughly Immediately remove and wash clothes or bedding that have blood, body fluids, and/or secretions or excretions, such as sweat, saliva, sputum, nasal mucus, vomit, urine, or feces, on them. Wear gloves when handling laundry from the patient. Read and follow directions on labels of laundry or clothing items and detergent. In general, wash and dry with the warmest temperatures recommended  on the label.  Clean all areas the individual has used often Clean all touchable surfaces, such as counters, tabletops, doorknobs, bathroom fixtures, toilets, phones, keyboards, tablets, and bedside tables, every day. Also, clean any surfaces that may have blood, body fluids, and/or secretions or excretions on them. Wear gloves when cleaning surfaces the patient has come in contact with. Use a diluted bleach solution (e.g., dilute bleach with 1 part bleach and 10 parts water) or a household disinfectant with a label that says EPA-registered for coronaviruses. To make a bleach solution at home, add 1 tablespoon of  bleach to 1 quart (4 cups) of water. For a larger supply, add  cup of bleach to 1 gallon (16 cups) of water. Read labels of cleaning products and follow recommendations provided on product labels. Labels contain instructions for safe and effective use of the cleaning product including precautions you should take when applying the product, such as wearing gloves or eye protection and making sure you have good ventilation during use of the product. Remove gloves and wash hands immediately after cleaning.  Monitor yourself for signs and symptoms of illness Caregivers and household members are considered close contacts, should monitor their health, and will be asked to limit movement outside of the home to the extent possible. Follow the monitoring steps for close contacts listed on the symptom monitoring form.  If you have additional questions, contact your local health department or call the epidemiologist on call at [phone] (available 24/7). This guidance is subject to change. For the most up-to-date guidance from [facility], please refer to their website: [employer]Met[employer].hu   Increase activity slowly   Complete by: As directed    MyChart COVID-19 home monitoring program   Complete by: Oct 03, 2019    Is the patient willing to use the MyChart Mobile App for home monitoring?: Yes     Allergies as of 10/03/2019      Reactions   Penicillins Rash   Has patient had a PCN reaction causing immediate rash, facial/tongue/throat swelling, SOB or lightheadedness with hypotension: Unknown Has patient had a PCN reaction causing severe rash involving mucus membranes or skin necrosis: No  Has patient had a PCN reaction that required hospitalization: No  Has patient had a PCN reaction occurring within the last 10 years: Yes  If all of the above answers are "NO", then may proceed with Cephalosporin use.      Medication List    TAKE these medications    dexamethasone 6 MG tablet Commonly known as: DECADRON Take 1 tablet (6 mg total) by mouth daily. Start taking on: October 04, 2019   hydrochlorothiazide 25 MG tablet Commonly known as: HYDRODIURIL TAKE 1 TABLET BY MOUTH ONCE DAILY IN THE MORNING What changed:   how much to take  how to take this  when to take this  additional instructions   ibuprofen 600 MG tablet Commonly known as: ADVIL Take 1 tablet (600 mg total) by mouth every 8 (eight) hours as needed. What changed: reasons to take this   losartan 50 MG tablet Commonly known as: COZAAR Take 1 tablet (50 mg total) by mouth daily.   lovastatin 20 MG tablet Commonly known as: MEVACOR Take 1 tablet (20 mg total) by mouth at bedtime.            Durable Medical Equipment  (From admission, onward)         Start     Ordered   10/02/19 1304  For home use only DME  Shower stool  Once     10/02/19 1303         Follow-up Information    Grayce Sessions, NP. Schedule an appointment as soon as possible for a visit in 1 week(s).   Specialty: Internal Medicine Why: Follow up with PCP in 1-2 weeks PCP Please obtain BMP/CBC, 2 view CXR in 1week Contact information: [address] [place] [address] [phone]          Allergies  Allergen Reactions  . Penicillins Rash    Has patient had a PCN reaction causing immediate rash, facial/tongue/throat swelling, SOB or lightheadedness with hypotension: Unknown Has patient had a PCN reaction causing severe rash involving mucus membranes or skin necrosis: No  Has patient had a PCN reaction that required hospitalization: No  Has patient had a PCN reaction occurring within the last 10 years: Yes  If all of the above answers are "NO", then may proceed with Cephalosporin use.     Consultations:  None  Procedures/Studies: DG Chest Port 1 View  Result Date: 09/27/2019 CLINICAL DATA:  Short of breath. EXAM: PORTABLE CHEST 1 VIEW COMPARISON:  09/25/2019  FINDINGS: Heart size appears normal. No pleural effusion or edema. Asymmetric opacity within the right upper lobe is again noted and appears unchanged from previous exam. No new findings. Osseous structures unremarkable. IMPRESSION: 1. No change in appearance of right upper lobe opacity. 2. No new findings. Electronically Signed   By: Signa Kell M.D.   On: 09/27/2019 14:45   DG Chest Port 1 View  Result Date: 09/25/2019 CLINICAL DATA:  Fever EXAM: PORTABLE CHEST 1 VIEW COMPARISON:  10/02/2012 FINDINGS: Vague right upper and lower lobe opacities. Hazy opacity left lung base. Normal heart size. No pneumothorax. IMPRESSION: Vague hazy right upper lobe and basilar opacities, atelectasis versus early pneumonia. Electronically Signed   By: Jasmine Pang M.D.   On: 09/25/2019 21:23     Subjective: Feels well, no dyspnea at rest, mild on exertion and no hypoxia, able to walk the halls freely. No chest pain or other complaints. Wants to go home.  Discharge Exam: Vitals:   10/03/19 0953 10/03/19 1122  BP:  101/69  Pulse:  (!) 111  Resp: 19 (!) 22  Temp:  97.6 F (36.4 C)  SpO2: 96% 97%   General: Pt is alert, awake, not in acute distress Cardiovascular: RRR, S1/S2 +, no rubs, no gallops Respiratory: CTA bilaterally, no wheezing, no rhonchi Abdominal: Soft, NT, ND, bowel sounds + Extremities: No edema, no cyanosis  Labs: BNP (last 3 results) Recent Labs    10/01/19 0022  BNP 17.8   Basic Metabolic Panel: Recent Labs  Lab 09/29/19 0316 09/30/19 0314 10/01/19 0022 10/02/19 0243 10/03/19 0020  NA 133* 133* 133* 132* 132*  K 4.0 4.5 4.5 4.2 4.3  CL 99 97* 96* 93* 97*  CO2 23 23 23 26 23   GLUCOSE 113* 102* 111* 117* 97  BUN 20 26* 24* 30* 36*  CREATININE 1.03 1.09 1.13 1.25* 1.14  CALCIUM 8.4* 8.5* 8.7* 8.5* 8.5*  MG  --   --  2.2  --   --    Liver Function Tests: Recent Labs  Lab 09/29/19 0316 09/30/19 0314 10/01/19 0022 10/02/19 0243 10/03/19 0020  AST 27 33 30 30 28    ALT 20 24 26  36 36  ALKPHOS 43 42 47 46 44  BILITOT 0.4 0.8 0.5 0.7 0.6  PROT 6.6 6.9 7.3 7.3 6.9  ALBUMIN 3.1* 3.1* 3.3* 3.5 3.3*  No results for input(s): LIPASE, AMYLASE in the last 168 hours. No results for input(s): AMMONIA in the last 168 hours. CBC: Recent Labs  Lab 09/27/19 1614 09/28/19 0436 09/29/19 0316 09/30/19 0314 10/01/19 0022  WBC 5.9 4.4 5.7 8.2 5.7  NEUTROABS 5.0 3.7 4.3 6.3 4.2  HGB 16.8 15.5 15.6 15.8 16.7  HCT 50.1 46.8 47.5 47.9 49.8  MCV 92.9 93.2 91.9 91.8 91.5  PLT 162 162 191 191 252   Cardiac Enzymes: No results for input(s): CKTOTAL, CKMB, CKMBINDEX, TROPONINI in the last 168 hours. BNP: Invalid input(s): POCBNP CBG: No results for input(s): GLUCAP in the last 168 hours. D-Dimer Recent Labs    10/01/19 0022  DDIMER 0.87*   Hgb A1c No results for input(s): HGBA1C in the last 72 hours. Lipid Profile No results for input(s): CHOL, HDL, LDLCALC, TRIG, CHOLHDL, LDLDIRECT in the last 72 hours. Thyroid function studies No results for input(s): TSH, T4TOTAL, T3FREE, THYROIDAB in the last 72 hours.  Invalid input(s): FREET3 Anemia work up No results for input(s): VITAMINB12, FOLATE, FERRITIN, TIBC, IRON, RETICCTPCT in the last 72 hours. Urinalysis    Component Value Date/Time   COLORURINE YELLOW 09/27/2019 2100   APPEARANCEUR CLEAR 09/27/2019 2100   LABSPEC 1.028 09/27/2019 2100   PHURINE 5.0 09/27/2019 2100   GLUCOSEU NEGATIVE 09/27/2019 2100   HGBUR SMALL (A) 09/27/2019 2100   BILIRUBINUR NEGATIVE 09/27/2019 2100   KETONESUR 5 (A) 09/27/2019 2100   PROTEINUR 100 (A) 09/27/2019 2100   UROBILINOGEN 0.2 03/04/2012 1030   NITRITE NEGATIVE 09/27/2019 2100   LEUKOCYTESUR NEGATIVE 09/27/2019 2100    Microbiology No results found for this or any previous visit (from the past 240 hour(s)).  Time coordinating discharge: Approximately 40 minutes  Tyrone Nine, MD  [facility] 10/03/2019, 4:12 PM

## 2019-10-03 NOTE — Discharge Instructions (Signed)
COVID-19 Frequently Asked Questions COVID-19 (coronavirus disease) is an infection that is caused by a large family of viruses. Some viruses cause illness in people and others cause illness in animals like camels, cats, and bats. In some cases, the viruses that cause illness in animals can spread to humans. Where did the coronavirus come from? In December 2019, [place] told the [facility]) of several cases of lung disease (human respiratory illness). These cases were linked to an open seafood and livestock market in the city of [place]. The link to the seafood and livestock market suggests that the virus may have spread from animals to humans. However, since that first outbreak in December, the virus has also been shown to spread from person to person. What is the name of the disease and the virus? Disease name Early on, this disease was called novel coronavirus. This is because scientists determined that the disease was caused by a new (novel) respiratory virus. The World Health Organization [facility]) has now named the disease COVID-19, or coronavirus disease. Virus name The virus that causes the disease is called severe acute respiratory syndrome coronavirus 2 (SARS-CoV-2). More information on disease and virus naming World Health Organization [facility]): www.who.int/emergencies/diseases/novel-coronavirus-2019/technical-guidance/naming-the-coronavirus-disease-(covid-2019)-and-the-virus-that-causes-it Who is at risk for complications from coronavirus disease? Some people may be at higher risk for complications from coronavirus disease. This includes older adults and people who have chronic diseases, such as heart disease, diabetes, and lung disease. If you are at higher risk for complications, take these extra precautions:  Stay home as much as possible.  Avoid social gatherings and travel.  Avoid close contact with others. Stay at least 6 ft (2 m) away from others, if  possible.  Wash your hands often with soap and water for at least 20 seconds.  Avoid touching your face, mouth, nose, or eyes.  Keep supplies on hand at home, such as food, medicine, and cleaning supplies.  If you must go out in public, wear a cloth face covering or face mask. Make sure your mask covers your nose and mouth. How does coronavirus disease spread? The virus that causes coronavirus disease spreads easily from person to person (is contagious). You may catch the virus by:  Breathing in droplets from an infected person. Droplets can be spread by a person breathing, speaking, singing, coughing, or sneezing.  Touching something, like a table or a doorknob, that was exposed to the virus (contaminated) and then touching your mouth, nose, or eyes. Can I get the virus from touching surfaces or objects? There is still a lot that we do not know about the virus that causes coronavirus disease. Scientists are basing a lot of information on what they know about similar viruses, such as:  Viruses cannot generally survive on surfaces for long. They need a human body (host) to survive.  It is more likely that the virus is spread by close contact with people who are sick (direct contact), such as through: ? Shaking hands or hugging. ? Breathing in respiratory droplets that travel through the air. Droplets can be spread by a person breathing, speaking, singing, coughing, or sneezing.  It is less likely that the virus is spread when a person touches a surface or object that has the virus on it (indirect contact). The virus may be able to enter the body if the person touches a surface or object and then touches his or her face, eyes, nose, or mouth. Can a person spread the virus without having symptoms of the  disease? It may be possible for the virus to spread before a person has symptoms of the disease, but this is most likely not the main way the virus is spreading. It is more likely for the virus  to spread by being in close contact with people who are sick and breathing in the respiratory droplets spread by a person breathing, speaking, singing, coughing, or sneezing. What are the symptoms of coronavirus disease? Symptoms vary from person to person and can range from mild to severe. Symptoms may include:  Fever or chills.  Cough.  Difficulty breathing or feeling short of breath.  Headaches, body aches, or muscle aches.  Runny or stuffy (congested) nose.  Sore throat.  New loss of taste or smell.  Nausea, vomiting, or diarrhea. These symptoms can appear anywhere from 2 to 14 days after you have been exposed to the virus. Some people may not have any symptoms. If you develop symptoms, call your health care provider. People with severe symptoms may need hospital care. Should I be tested for this virus? Your health care provider will decide whether to test you based on your symptoms, history of exposure, and your risk factors. How does a health care provider test for this virus? Health care providers will collect samples to send for testing. Samples may include:  Taking a swab of fluid from the back of your nose and throat, your nose, or your throat.  Taking fluid from the lungs by having you cough up mucus (sputum) into a sterile cup.  Taking a blood sample. Is there a treatment or vaccine for this virus? Currently, there is no vaccine to prevent coronavirus disease. Also, there are no medicines like antibiotics or antivirals to treat the virus. A person who becomes sick is given supportive care, which means rest and fluids. A person may also relieve his or her symptoms by using over-the-counter medicines that treat sneezing, coughing, and runny nose. These are the same medicines that a person takes for the common cold. If you develop symptoms, call your health care provider. People with severe symptoms may need hospital care. What can I do to protect myself and my family from  this virus?     You can protect yourself and your family by taking the same actions that you would take to prevent the spread of other viruses. Take the following actions:  Wash your hands often with soap and water for at least 20 seconds. If soap and water are not available, use alcohol-based hand sanitizer.  Avoid touching your face, mouth, nose, or eyes.  Cough or sneeze into a tissue, sleeve, or elbow. Do not cough or sneeze into your hand or the air. ? If you cough or sneeze into a tissue, throw it away immediately and wash your hands.  Disinfect objects and surfaces that you frequently touch every day.  Stay away from people who are sick.  Avoid going out in public, follow guidance from your state and local health authorities.  Avoid crowded indoor spaces. Stay at least 6 ft (2 m) away from others.  If you must go out in public, wear a cloth face covering or face mask. Make sure your mask covers your nose and mouth.  Stay home if you are sick, except to get medical care. Call your health care provider before you get medical care. Your health care provider will tell you how long to stay home.  Make sure your vaccines are up to date. Ask your health care provider  what vaccines you need. What should I do if I need to travel? Follow travel recommendations from your local health authority, the CDC, and WHO. Travel information and advice  Centers for Disease Control and Prevention (CDC): BodyEditor.hu  World Health Organization [facility]): ThirdIncome.ca Know the risks and take action to protect your health  You are at higher risk of getting coronavirus disease if you are traveling to areas with an outbreak or if you are exposed to travelers from areas with an outbreak.  Wash your hands often and practice good hygiene to lower the risk of catching or spreading the virus. What should I do  if I am sick? General instructions to stop the spread of infection  Wash your hands often with soap and water for at least 20 seconds. If soap and water are not available, use alcohol-based hand sanitizer.  Cough or sneeze into a tissue, sleeve, or elbow. Do not cough or sneeze into your hand or the air.  If you cough or sneeze into a tissue, throw it away immediately and wash your hands.  Stay home unless you must get medical care. Call your health care provider or local health authority before you get medical care.  Avoid public areas. Do not take public transportation, if possible.  If you can, wear a mask if you must go out of the house or if you are in close contact with someone who is not sick. Make sure your mask covers your nose and mouth. Keep your home clean  Disinfect objects and surfaces that are frequently touched every day. This may include: ? Counters and tables. ? Doorknobs and light switches. ? Sinks and faucets. ? Electronics such as phones, remote controls, keyboards, computers, and tablets.  Wash dishes in hot, soapy water or use a dishwasher. Air-dry your dishes.  Wash laundry in hot water. Prevent infecting other household members  Let healthy household members care for children and pets, if possible. If you have to care for children or pets, wash your hands often and wear a mask.  Sleep in a different bedroom or bed, if possible.  Do not share personal items, such as razors, toothbrushes, deodorant, combs, brushes, towels, and washcloths. Where to find more information Centers for Disease Control and Prevention (CDC)  Information and news updates: https://www.butler-gonzalez.com/ World Health Organization [facility])  Information and news updates: MissExecutive.com.ee  Coronavirus health topic: https://www.castaneda.info/  Questions and answers on COVID-19:  OpportunityDebt.at  Global tracker: who.sprinklr.com American Academy of Pediatrics (AAP)  Information for families: www.healthychildren.org/English/health-issues/conditions/chest-lungs/Pages/2019-Novel-Coronavirus.aspx The coronavirus situation is changing rapidly. Check your local health authority website or the CDC and [facility] websites for updates and news. When should I contact a health care provider?  Contact your health care provider if you have symptoms of an infection, such as fever or cough, and you: ? Have been near anyone who is known to have coronavirus disease. ? Have come into contact with a person who is suspected to have coronavirus disease. ? Have traveled to an area where there is an outbreak of COVID-19. When should I get emergency medical care?  Get help right away by calling your local emergency services (911 in the [place] if you have: ? Trouble breathing. ? Pain or pressure in your chest. ? Confusion. ? Blue-tinged lips and fingernails. ? Difficulty waking from sleep. ? Symptoms that get worse. Let the emergency medical personnel know if you think you have coronavirus disease. Summary  A new respiratory virus is spreading from person to person and  causing COVID-19 (coronavirus disease).  The virus that causes COVID-19 appears to spread easily. It spreads from one person to another through droplets from breathing, speaking, singing, coughing, or sneezing.  Older adults and those with chronic diseases are at higher risk of disease. If you are at higher risk for complications, take extra precautions.  There is currently no vaccine to prevent coronavirus disease. There are no medicines, such as antibiotics or antivirals, to treat the virus.  You can protect yourself and your family by washing your hands often, avoiding touching your face, and covering your coughs and sneezes. This information is not intended to replace advice given to you  by your health care provider. Make sure you discuss any questions you have with your health care provider. Document Revised: 07/10/2019 Document Reviewed: 01/06/2019 Elsevier Patient Education  2020 Elsevier Inc.    Person Under Monitoring Name: Randall Cook  Location: [address] [place] [address]   Infection Prevention Recommendations for Individuals Confirmed to have, or Being Evaluated for, 2019 Novel Coronavirus (COVID-19) Infection Who Receive Care at Home  Individuals who are confirmed to have, or are being evaluated for, COVID-19 should follow the prevention steps below until a healthcare provider or local or state health department says they can return to normal activities.  Stay home except to get medical care You should restrict activities outside your home, except for getting medical care. Do not go to work, school, or public areas, and do not use public transportation or taxis.  Call ahead before visiting your doctor Before your medical appointment, call the healthcare provider and tell them that you have, or are being evaluated for, COVID-19 infection. This will help the healthcare provider's office take steps to keep other people from getting infected. Ask your healthcare provider to call the local or state health department.  Monitor your symptoms Seek prompt medical attention if your illness is worsening (e.g., difficulty breathing). Before going to your medical appointment, call the healthcare provider and tell them that you have, or are being evaluated for, COVID-19 infection. Ask your healthcare provider to call the local or state health department.  Wear a facemask You should wear a facemask that covers your nose and mouth when you are in the same room with other people and when you visit a healthcare provider. People who live with or visit you should also wear a facemask while they are in the same room with you.  Separate yourself from other people  in your home As much as possible, you should stay in a different room from other people in your home. Also, you should use a separate bathroom, if available.  Avoid sharing household items You should not share dishes, drinking glasses, cups, eating utensils, towels, bedding, or other items with other people in your home. After using these items, you should wash them thoroughly with soap and water.  Cover your coughs and sneezes Cover your mouth and nose with a tissue when you cough or sneeze, or you can cough or sneeze into your sleeve. Throw used tissues in a lined trash can, and immediately wash your hands with soap and water for at least 20 seconds or use an alcohol-based hand rub.  Wash your [employer] your hands often and thoroughly with soap and water for at least 20 seconds. You can use an alcohol-based hand sanitizer if soap and water are not available and if your hands are not visibly dirty. Avoid touching your eyes, nose, and mouth with unwashed  hands.   Prevention Steps for Caregivers and Household Members of Individuals Confirmed to have, or Being Evaluated for, COVID-19 Infection Being Cared for in the Home  If you live with, or provide care at home for, a person confirmed to have, or being evaluated for, COVID-19 infection please follow these guidelines to prevent infection:  Follow healthcare provider's instructions Make sure that you understand and can help the patient follow any healthcare provider instructions for all care.  Provide for the patient's basic needs You should help the patient with basic needs in the home and provide support for getting groceries, prescriptions, and other personal needs.  Monitor the patient's symptoms If they are getting sicker, call his or her medical provider and tell them that the patient has, or is being evaluated for, COVID-19 infection. This will help the healthcare provider's office take steps to keep other people from getting  infected. Ask the healthcare provider to call the local or state health department.  Limit the number of people who have contact with the patient  If possible, have only one caregiver for the patient.  Other household members should stay in another home or place of residence. If this is not possible, they should stay  in another room, or be separated from the patient as much as possible. Use a separate bathroom, if available.  Restrict visitors who do not have an essential need to be in the home.  Keep older adults, very young children, and other sick people away from the patient Keep older adults, very young children, and those who have compromised immune systems or chronic health conditions away from the patient. This includes people with chronic heart, lung, or kidney conditions, diabetes, and cancer.  Ensure good ventilation Make sure that shared spaces in the home have good air flow, such as from an air conditioner or an opened window, weather permitting.  Wash your hands often  Wash your hands often and thoroughly with soap and water for at least 20 seconds. You can use an alcohol based hand sanitizer if soap and water are not available and if your hands are not visibly dirty.  Avoid touching your eyes, nose, and mouth with unwashed hands.  Use disposable paper towels to dry your hands. If not available, use dedicated cloth towels and replace them when they become wet.  Wear a facemask and gloves  Wear a disposable facemask at all times in the room and gloves when you touch or have contact with the patient's blood, body fluids, and/or secretions or excretions, such as sweat, saliva, sputum, nasal mucus, vomit, urine, or feces.  Ensure the mask fits over your nose and mouth tightly, and do not touch it during use.  Throw out disposable facemasks and gloves after using them. Do not reuse.  Wash your hands immediately after removing your facemask and gloves.  If your personal  clothing becomes contaminated, carefully remove clothing and launder. Wash your hands after handling contaminated clothing.  Place all used disposable facemasks, gloves, and other waste in a lined container before disposing them with other household waste.  Remove gloves and wash your hands immediately after handling these items.  Do not share dishes, glasses, or other household items with the patient  Avoid sharing household items. You should not share dishes, drinking glasses, cups, eating utensils, towels, bedding, or other items with a patient who is confirmed to have, or being evaluated for, COVID-19 infection.  After the person uses these items, you should wash them thoroughly  with soap and water.  Wash laundry thoroughly  Immediately remove and wash clothes or bedding that have blood, body fluids, and/or secretions or excretions, such as sweat, saliva, sputum, nasal mucus, vomit, urine, or feces, on them.  Wear gloves when handling laundry from the patient.  Read and follow directions on labels of laundry or clothing items and detergent. In general, wash and dry with the warmest temperatures recommended on the label.  Clean all areas the individual has used often  Clean all touchable surfaces, such as counters, tabletops, doorknobs, bathroom fixtures, toilets, phones, keyboards, tablets, and bedside tables, every day. Also, clean any surfaces that may have blood, body fluids, and/or secretions or excretions on them.  Wear gloves when cleaning surfaces the patient has come in contact with.  Use a diluted bleach solution (e.g., dilute bleach with 1 part bleach and 10 parts water) or a household disinfectant with a label that says EPA-registered for coronaviruses. To make a bleach solution at home, add 1 tablespoon of bleach to 1 quart (4 cups) of water. For a larger supply, add  cup of bleach to 1 gallon (16 cups) of water.  Read labels of cleaning products and follow  recommendations provided on product labels. Labels contain instructions for safe and effective use of the cleaning product including precautions you should take when applying the product, such as wearing gloves or eye protection and making sure you have good ventilation during use of the product.  Remove gloves and wash hands immediately after cleaning.  Monitor yourself for signs and symptoms of illness Caregivers and household members are considered close contacts, should monitor their health, and will be asked to limit movement outside of the home to the extent possible. Follow the monitoring steps for close contacts listed on the symptom monitoring form.   ? If you have additional questions, contact your local health department or call the epidemiologist on call at [phone] (available 24/7). ? This guidance is subject to change. For the most up-to-date guidance from [facility], please refer to their website: [email or web address]

## 2019-10-15 ENCOUNTER — Other Ambulatory Visit: Payer: Self-pay

## 2019-10-15 DIAGNOSIS — Z20822 Contact with and (suspected) exposure to covid-19: Secondary | ICD-10-CM

## 2019-10-16 LAB — NOVEL CORONAVIRUS, NAA: SARS-CoV-2, NAA: NOT DETECTED

## 2019-10-20 ENCOUNTER — Telehealth (INDEPENDENT_AMBULATORY_CARE_PROVIDER_SITE_OTHER): Payer: Self-pay | Admitting: Primary Care

## 2019-10-20 ENCOUNTER — Ambulatory Visit (INDEPENDENT_AMBULATORY_CARE_PROVIDER_SITE_OTHER): Payer: BC Managed Care – PPO | Admitting: Primary Care

## 2019-10-20 ENCOUNTER — Encounter (INDEPENDENT_AMBULATORY_CARE_PROVIDER_SITE_OTHER): Payer: Self-pay | Admitting: Primary Care

## 2019-10-20 ENCOUNTER — Other Ambulatory Visit: Payer: Self-pay

## 2019-10-20 DIAGNOSIS — J9601 Acute respiratory failure with hypoxia: Secondary | ICD-10-CM | POA: Diagnosis not present

## 2019-10-20 DIAGNOSIS — U071 COVID-19: Secondary | ICD-10-CM

## 2019-10-20 DIAGNOSIS — Z09 Encounter for follow-up examination after completed treatment for conditions other than malignant neoplasm: Secondary | ICD-10-CM | POA: Diagnosis not present

## 2019-10-20 NOTE — Progress Notes (Signed)
Virtual Visit via Telephone Note  I connected with Randall Cook on 10/20/19 at 10:30 AM EST by telephone and verified that I am speaking with the correct person using two identifiers.   I discussed the limitations, risks, security and privacy concerns of performing an evaluation and management service by telephone and the availability of in person appointments. I also discussed with the patient that there may be a patient responsible charge related to this service. The patient expressed understanding and agreed to proceed.   History of Present Illness: Randall Cook is having a tele visit for hospital follow up from COVID. His wife is still in the hospital. There was a church member positive and did not disclose result he feels this is were he contracted COVID   Past Medical History:  Diagnosis Date  . Arthritis   . Dysrhythmia    tachycardia- EKG,clearance 02/29/12 Dr Azucena Cecil  on chart  . GERD (gastroesophageal reflux disease)   . Hyperlipidemia   . Hypertension    LOV,   Dr Azucena Cecil 02/29/12 on chart-   CBC with diff, [facility] 02/29/12 on chart  . Sleep apnea    STOP BANG SCORE 4   Current Outpatient Medications on File Prior to Visit  Medication Sig Dispense Refill  . hydrochlorothiazide (HYDRODIURIL) 25 MG tablet TAKE 1 TABLET BY MOUTH ONCE DAILY IN THE MORNING (Patient taking differently: Take 25 mg by mouth daily. ) 90 tablet 1  . ibuprofen (ADVIL,MOTRIN) 600 MG tablet Take 1 tablet (600 mg total) by mouth every 8 (eight) hours as needed. (Patient taking differently: Take 600 mg by mouth every 8 (eight) hours as needed for mild pain or moderate pain. ) 30 tablet 0  . losartan (COZAAR) 50 MG tablet Take 1 tablet (50 mg total) by mouth daily. 90 tablet 1  . lovastatin (MEVACOR) 20 MG tablet Take 1 tablet (20 mg total) by mouth at bedtime. 90 tablet 3   No current facility-administered medications on file prior to visit.   Observations/Objective: Review of Systems  All other systems  reviewed and are negative.  Assessment and Plan: Praise was seen today for covid positive.  Diagnoses and all orders for this visit:  Acute hypoxemic respiratory failure due to COVID-19 [facility]) Patient tested positive for COVID on 09/27/2019 , having worsening SOB, cough with upper abdominal  pain. No vomiting, focal weakness. Hypoxic and placed on 3 liters of O2 Chest x-ray showed RUL opacity. Admitted to [facility].  Hospital discharge follow-up Recommendations for Outpatient Follow-up:  Follow up with PCP in 1-2 weeks Please obtain CMP/CBC in one week in future tele visit . He is very glad to be home despite his wife remains in the hospital with COVID.   Follow Up Instructions:    I discussed the assessment and treatment plan with the patient. The patient was provided an opportunity to ask questions and all were answered. The patient agreed with the plan and demonstrated an understanding of the instructions.   The patient was advised to call back or seek an in-person evaluation if the symptoms worsen or if the condition fails to improve as anticipated.  I provided 15 minutes of non-face-to-face time during this encounter. Review encounters, labs and imaging   Grayce Sessions, NP

## 2019-10-20 NOTE — Telephone Encounter (Signed)
Negative COVID results given. Patient results "NOT Detected." Caller expressed understanding. ° °

## 2019-10-20 NOTE — Progress Notes (Signed)
Positive covid on 09/25/19 Pt retested on 10/16/19- awaiting results  Pt is feeling better

## 2019-11-07 ENCOUNTER — Other Ambulatory Visit (INDEPENDENT_AMBULATORY_CARE_PROVIDER_SITE_OTHER): Payer: Self-pay | Admitting: Primary Care

## 2019-11-07 DIAGNOSIS — I10 Essential (primary) hypertension: Secondary | ICD-10-CM

## 2019-11-09 ENCOUNTER — Other Ambulatory Visit (INDEPENDENT_AMBULATORY_CARE_PROVIDER_SITE_OTHER): Payer: Self-pay | Admitting: Primary Care

## 2019-11-09 NOTE — Telephone Encounter (Signed)
Sent to PCP ?

## 2019-11-12 ENCOUNTER — Ambulatory Visit (INDEPENDENT_AMBULATORY_CARE_PROVIDER_SITE_OTHER): Payer: BC Managed Care – PPO | Admitting: Primary Care

## 2019-11-12 ENCOUNTER — Other Ambulatory Visit: Payer: Self-pay

## 2019-11-12 ENCOUNTER — Encounter (INDEPENDENT_AMBULATORY_CARE_PROVIDER_SITE_OTHER): Payer: Self-pay | Admitting: Primary Care

## 2019-11-12 DIAGNOSIS — E782 Mixed hyperlipidemia: Secondary | ICD-10-CM

## 2019-11-12 DIAGNOSIS — J3489 Other specified disorders of nose and nasal sinuses: Secondary | ICD-10-CM | POA: Diagnosis not present

## 2019-11-12 NOTE — Progress Notes (Signed)
Pt states that since being hospitalized for Covid He believes the oxygen mask irritated his nose He has scabbing and then once he picks the scab- it bleeds. The dries up.

## 2019-11-12 NOTE — Progress Notes (Signed)
Virtual Visit via Telephone Note  I connected with Randall Cook on 11/12/19 at  4:10 PM EST by telephone and verified that I am speaking with the correct person using two identifiers.   I discussed the limitations, risks, security and privacy concerns of performing an evaluation and management service by telephone and the availability of in person appointments. I also discussed with the patient that there may be a patient responsible charge related to this service. The patient expressed understanding and agreed to proceed.   History of Present Illness: [name] is having a tele visit nasal scabs and pain. He states during his hospitalization he needed oxygen when he complained of nasal soreness and placed on O2 with humidifier. Past Medical History:  Diagnosis Date  . Arthritis   . Dysrhythmia    tachycardia- EKG,clearance 02/29/12 Dr Azucena Cecil  on chart  . GERD (gastroesophageal reflux disease)   . Hyperlipidemia   . Hypertension    LOV,   Dr Azucena Cecil 02/29/12 on chart-   CBC with diff, CMET 02/29/12 on chart  . Sleep apnea    STOP BANG SCORE 4   Current Outpatient Medications on File Prior to Visit  Medication Sig Dispense Refill  . hydrochlorothiazide (HYDRODIURIL) 25 MG tablet TAKE 1 TABLET BY MOUTH ONCE DAILY IN THE MORNING 90 tablet 0  . ibuprofen (ADVIL,MOTRIN) 600 MG tablet Take 1 tablet (600 mg total) by mouth every 8 (eight) hours as needed. (Patient taking differently: Take 600 mg by mouth every 8 (eight) hours as needed for mild pain or moderate pain. ) 30 tablet 0  . losartan (COZAAR) 50 MG tablet Take 1 tablet by mouth once daily 90 tablet 0  . lovastatin (MEVACOR) 20 MG tablet Take 1 tablet (20 mg total) by mouth at bedtime. 90 tablet 3   No current facility-administered medications on file prior to visit.   Observations/Objective: Review of Systems  HENT:       Nasal dryness   Assessment and Plan: [name] was seen today for epistaxis.  Diagnoses and all orders  for this visit:  Mixed hyperlipidemia -     Lipid panel; Future  Nasal dryness Nosed dried with the use of oxygen now scabs inside nose advised DO NOT PICK SCABS OUT OF NOSE use petroleum jelly to soften scabs and let them fall naturally    Follow Up Instructions:    I discussed the assessment and treatment plan with the patient. The patient was provided an opportunity to ask questions and all were answered. The patient agreed with the plan and demonstrated an understanding of the instructions.   The patient was advised to call back or seek an in-person evaluation if the symptoms worsen or if the condition fails to improve as anticipated.  I provided 8 minutes of non-face-to-face time during this encounter.   Grayce Sessions, NP

## 2019-12-03 ENCOUNTER — Other Ambulatory Visit: Payer: Self-pay

## 2019-12-03 ENCOUNTER — Encounter (INDEPENDENT_AMBULATORY_CARE_PROVIDER_SITE_OTHER): Payer: Self-pay | Admitting: Primary Care

## 2019-12-03 ENCOUNTER — Ambulatory Visit (INDEPENDENT_AMBULATORY_CARE_PROVIDER_SITE_OTHER): Payer: BC Managed Care – PPO | Admitting: Primary Care

## 2019-12-03 VITALS — BP 134/83 | HR 98 | Temp 97.5°F | Ht 73.0 in | Wt 247.0 lb

## 2019-12-03 DIAGNOSIS — I10 Essential (primary) hypertension: Secondary | ICD-10-CM | POA: Diagnosis not present

## 2019-12-03 DIAGNOSIS — E785 Hyperlipidemia, unspecified: Secondary | ICD-10-CM

## 2019-12-03 DIAGNOSIS — L02429 Furuncle of limb, unspecified: Secondary | ICD-10-CM

## 2019-12-03 MED ORDER — LOSARTAN POTASSIUM 50 MG PO TABS: 50.0000 mg | ORAL_TABLET | Freq: Every day | ORAL | 1 refills | Status: DC

## 2019-12-03 MED ORDER — HYDROCHLOROTHIAZIDE 25 MG PO TABS: ORAL_TABLET | ORAL | 1 refills | Status: DC

## 2019-12-03 NOTE — Patient Instructions (Signed)
Skin Abscess  A skin abscess is an infected area on or under your skin that contains a collection of pus and other material. An abscess may also be called a furuncle, carbuncle, or boil. An abscess can occur in or on almost any part of your body. Some abscesses break open (rupture) on their own. Most continue to get worse unless they are treated. The infection can spread deeper into the body and eventually into your blood, which can make you feel ill. Treatment usually involves draining the abscess. What are the causes? An abscess occurs when germs, like bacteria, pass through your skin and cause an infection. This may be caused by:  A scrape or cut on your skin.  A puncture wound through your skin, including a needle injection or insect bite.  Blocked oil or sweat glands.  Blocked and infected hair follicles.  A cyst that forms beneath your skin (sebaceous cyst) and becomes infected. What increases the risk? This condition is more likely to develop in people who:  Have a weak body defense system (immune system).  Have diabetes.  Have dry and irritated skin.  Get frequent injections or use illegal IV drugs.  Have a foreign body in a wound, such as a splinter.  Have problems with their lymph system or veins. What are the signs or symptoms? Symptoms of this condition include:  A painful, firm bump under the skin.  A bump with pus at the top. This may break through the skin and drain. Other symptoms include:  Redness surrounding the abscess site.  Warmth.  Swelling of the lymph nodes (glands) near the abscess.  Tenderness.  A sore on the skin. How is this diagnosed? This condition may be diagnosed based on:  A physical exam.  Your medical history.  A sample of pus. This may be used to find out what is causing the infection.  Blood tests.  Imaging tests, such as an ultrasound, CT scan, or MRI. How is this treated? A small abscess that drains on its own may  not need treatment. Treatment for larger abscesses may include:  Moist heat or heat pack applied to the area several times a day.  A procedure to drain the abscess (incision and drainage).  Antibiotic medicines. For a severe abscess, you may first get antibiotics through an IV and then change to antibiotics by mouth. Follow these instructions at home: Medicines   Take over-the-counter and prescription medicines only as told by your health care provider.  If you were prescribed an antibiotic medicine, take it as told by your health care provider. Do not stop taking the antibiotic even if you start to feel better. Abscess care   If you have an abscess that has not drained, apply heat to the affected area. Use the heat source that your health care provider recommends, such as a moist heat pack or a heating pad. ? Place a towel between your skin and the heat source. ? Leave the heat on for 20-30 minutes. ? Remove the heat if your skin turns bright red. This is especially important if you are unable to feel pain, heat, or cold. You may have a greater risk of getting burned.  Follow instructions from your health care provider about how to take care of your abscess. Make sure you: ? Cover the abscess with a bandage (dressing). ? Change your dressing or gauze as told by your health care provider. ? Wash your hands with soap and water before you change the   dressing or gauze. If soap and water are not available, use hand sanitizer.  Check your abscess every day for signs of a worsening infection. Check for: ? More redness, swelling, or pain. ? More fluid or blood. ? Warmth. ? More pus or a bad smell. General instructions  To avoid spreading the infection: ? Do not share personal care items, towels, or hot tubs with others. ? Avoid making skin contact with other people.  Keep all follow-up visits as told by your health care provider. This is important. Contact a health care provider if  you have:  More redness, swelling, or pain around your abscess.  More fluid or blood coming from your abscess.  Warm skin around your abscess.  More pus or a bad smell coming from your abscess.  A fever.  Muscle aches.  Chills or a general ill feeling. Get help right away if you:  Have severe pain.  See red streaks on your skin spreading away from the abscess. Summary  A skin abscess is an infected area on or under your skin that contains a collection of pus and other material.  A small abscess that drains on its own may not need treatment.  Treatment for larger abscesses may include having a procedure to drain the abscess and taking an antibiotic. This information is not intended to replace advice given to you by your health care provider. Make sure you discuss any questions you have with your health care provider. Document Revised: 01/01/2019 Document Reviewed: 10/24/2017 Elsevier Patient Education  2020 Elsevier Inc.  

## 2019-12-03 NOTE — Progress Notes (Signed)
Acute Office Visit  Subjective:    Patient ID: Randall Cook, male    DOB: 1956-05-06, 64 y.o.   MRN: [id number]  Chief Complaint  Patient presents with  . lump on right shoulder    Pt. have a lump on his back on the right side. Pt. stated it is painful but has been there for a while.     HPI Patient is in today for acute visit he present with a boil on his back states it has been there for years but recently started hurting last weeks. Denies squeezing or picking on it but has been using icy hot- he does not feel there is no changes in size using different products for arthritis.   Past Medical History:  Diagnosis Date  . Arthritis   . Dysrhythmia    tachycardia- EKG,clearance 02/29/12 Dr Moreen Fowler  on chart  . GERD (gastroesophageal reflux disease)   . Hyperlipidemia   . Hypertension    LOV,   Dr Moreen Fowler 02/29/12 on chart-   CBC with diff, CMET 02/29/12 on chart  . Sleep apnea    STOP BANG SCORE 4    Past Surgical History:  Procedure Laterality Date  . NO PAST SURGERIES    . PARTIAL KNEE ARTHROPLASTY  03/10/2012   Procedure: UNICOMPARTMENTAL KNEE;  Surgeon: Mauri Pole, MD;  Location: [facility];  Service: Orthopedics;  Laterality: Left;    History reviewed. No pertinent family history.  Social History   Socioeconomic History  . Marital status: Married    Spouse name: Not on file  . Number of children: Not on file  . Years of education: Not on file  . Highest education level: Not on file  Occupational History  . Not on file  Tobacco Use  . Smoking status: Former Smoker    Packs/day: 0.50    Years: 20.00    Pack years: 10.00    Types: Cigarettes    Quit date: 03/05/1999    Years since quitting: 20.7  . Smokeless tobacco: Never Used  Substance and Sexual Activity  . Alcohol use: Yes    Comment: none x 20 yrs  . Drug use: No  . Sexual activity: Not on file  Other Topics Concern  . Not on file  Social History Narrative  . Not on file   Social Determinants of  Health   Financial Resource Strain:   . Difficulty of Paying Living Expenses:   Food Insecurity:   . Worried About Charity fundraiser in the Last Year:   . Arboriculturist in the Last Year:   Transportation Needs:   . Film/video editor (Medical):   Marland Kitchen Lack of Transportation (Non-Medical):   Physical Activity:   . Days of Exercise per Week:   . Minutes of Exercise per Session:   Stress:   . Feeling of Stress :   Social Connections:   . Frequency of Communication with Friends and Family:   . Frequency of Social Gatherings with Friends and Family:   . Attends Religious Services:   . Active Member of Clubs or Organizations:   . Attends Archivist Meetings:   Marland Kitchen Marital Status:   Intimate Partner Violence:   . Fear of Current or Ex-Partner:   . Emotionally Abused:   Marland Kitchen Physically Abused:   . Sexually Abused:     Outpatient Medications Prior to Visit  Medication Sig Dispense Refill  . ibuprofen (ADVIL,MOTRIN) 600 MG tablet Take 1 tablet (  600 mg total) by mouth every 8 (eight) hours as needed. (Patient taking differently: Take 600 mg by mouth every 8 (eight) hours as needed for mild pain or moderate pain. ) 30 tablet 0  . lovastatin (MEVACOR) 20 MG tablet Take 1 tablet (20 mg total) by mouth at bedtime. 90 tablet 3  . hydrochlorothiazide (HYDRODIURIL) 25 MG tablet TAKE 1 TABLET BY MOUTH ONCE DAILY IN THE MORNING 90 tablet 0  . losartan (COZAAR) 50 MG tablet Take 1 tablet by mouth once daily 90 tablet 0   No facility-administered medications prior to visit.    Allergies  Allergen Reactions  . Penicillins Rash    Has patient had a PCN reaction causing immediate rash, facial/tongue/throat swelling, SOB or lightheadedness with hypotension: Unknown Has patient had a PCN reaction causing severe rash involving mucus membranes or skin necrosis: No  Has patient had a PCN reaction that required hospitalization: No  Has patient had a PCN reaction occurring within the last 10  years: Yes  If all of the above answers are "NO", then may proceed with Cephalosporin use.     Review of Systems  Skin:       Cyst on right shoulder Painful when lays down on area  All other systems reviewed and are negative.      Objective:    Physical Exam Vitals reviewed.  Constitutional:      Appearance: He is obese.  HENT:     Head: Normocephalic.  Pulmonary:     Effort: Pulmonary effort is normal.     Breath sounds: Normal breath sounds.  Abdominal:     General: Bowel sounds are normal.  Neurological:     Mental Status: He is alert and oriented to person, place, and time.  Psychiatric:        Mood and Affect: Mood normal.        Behavior: Behavior normal.        Thought Content: Thought content normal.        Judgment: Judgment normal.     BP 134/83 (BP Location: Right Arm, Patient Position: Sitting, Cuff Size: Large)   Pulse 98   Temp (!) 97.5 F (36.4 C) (Temporal)   Ht 6' 1" (1.854 m)   Wt 247 lb (112 kg)   SpO2 94%   BMI 32.59 kg/m  Wt Readings from Last 3 Encounters:  12/03/19 247 lb (112 kg)  09/30/19 235 lb 14.3 oz (107 kg)  11/24/18 238 lb 9.6 oz (108.2 kg)    Health Maintenance Due  Topic Date Due  . Hepatitis C Screening  Never done  . TETANUS/TDAP  Never done  . COLONOSCOPY  Never done  . INFLUENZA VACCINE  Never done    There are no preventive care reminders to display for this patient.   No results found for: TSH Lab Results  Component Value Date   WBC 5.7 10/01/2019   HGB 16.7 10/01/2019   HCT 49.8 10/01/2019   MCV 91.5 10/01/2019   PLT 252 10/01/2019   Lab Results  Component Value Date   NA 132 (L) 10/03/2019   K 4.3 10/03/2019   CO2 23 10/03/2019   GLUCOSE 97 10/03/2019   BUN 36 (H) 10/03/2019   CREATININE 1.14 10/03/2019   BILITOT 0.6 10/03/2019   ALKPHOS 44 10/03/2019   AST 28 10/03/2019   ALT 36 10/03/2019   PROT 6.9 10/03/2019   ALBUMIN 3.3 (L) 10/03/2019   CALCIUM 8.5 (L) 10/03/2019   ANIONGAP 12  10/03/2019   Lab Results  Component Value Date   CHOL 218 (H) 04/08/2017   Lab Results  Component Value Date   HDL 59 04/08/2017   Lab Results  Component Value Date   LDLCALC 134 (H) 04/08/2017   Lab Results  Component Value Date   TRIG 123 04/08/2017   Lab Results  Component Value Date   CHOLHDL 3.7 04/08/2017   No results found for: HGBA1C     Assessment & Plan:  Romir was seen today for lump on right shoulder.  Diagnoses and all orders for this visit:  Furuncle of shoulder    -     CBC with Differential/Platelet; Future -     Ambulatory referral to Dermatology  Hypertension, unspecified type Counseled on blood pressure goal of less than 130/80, low-sodium, DASH diet, medication compliance, 150 minutes of moderate intensity exercise per week. Discussed medication compliance, adverse effects. -     losartan (COZAAR) 50 MG tablet; Take 1 tablet (50 mg total) by mouth daily. -     hydrochlorothiazide (HYDRODIURIL) 25 MG tablet; Take 1 tablet daily in the morning -     CBC with Differential/Platelet; Future -     CMP14+EGFR; Future  Hyperlipidemia, unspecified hyperlipidemia type  Decrease your fatty foods, red meat, cheese, milk and increase fiber like whole grains and veggies. Will prescribe cholesterol after blood work if indicated -     Lipid panel; Future   Meds ordered this encounter  Medications  . losartan (COZAAR) 50 MG tablet    Sig: Take 1 tablet (50 mg total) by mouth daily.    Dispense:  90 tablet    Refill:  1  . hydrochlorothiazide (HYDRODIURIL) 25 MG tablet    Sig: Take 1 tablet daily in the morning    Dispense:  90 tablet    Refill:  1     Kerin Perna, NP

## 2019-12-08 ENCOUNTER — Other Ambulatory Visit: Payer: Self-pay

## 2019-12-08 ENCOUNTER — Other Ambulatory Visit (INDEPENDENT_AMBULATORY_CARE_PROVIDER_SITE_OTHER): Payer: BC Managed Care – PPO

## 2019-12-08 DIAGNOSIS — L02429 Furuncle of limb, unspecified: Secondary | ICD-10-CM

## 2019-12-08 DIAGNOSIS — I10 Essential (primary) hypertension: Secondary | ICD-10-CM

## 2019-12-08 DIAGNOSIS — E782 Mixed hyperlipidemia: Secondary | ICD-10-CM

## 2019-12-08 DIAGNOSIS — E785 Hyperlipidemia, unspecified: Secondary | ICD-10-CM

## 2019-12-09 ENCOUNTER — Other Ambulatory Visit (INDEPENDENT_AMBULATORY_CARE_PROVIDER_SITE_OTHER): Payer: Self-pay | Admitting: Primary Care

## 2019-12-09 LAB — CMP14+EGFR
ALT: 14 IU/L (ref 0–44)
AST: 18 IU/L (ref 0–40)
Albumin/Globulin Ratio: 1.4 (ref 1.2–2.2)
Albumin: 4.1 g/dL (ref 3.8–4.8)
Alkaline Phosphatase: 81 IU/L (ref 39–117)
BUN/Creatinine Ratio: 12 (ref 10–24)
BUN: 12 mg/dL (ref 8–27)
Bilirubin Total: 0.5 mg/dL (ref 0.0–1.2)
CO2: 23 mmol/L (ref 20–29)
Calcium: 9.6 mg/dL (ref 8.6–10.2)
Chloride: 101 mmol/L (ref 96–106)
Creatinine, Ser: 1 mg/dL (ref 0.76–1.27)
GFR calc Af Amer: 93 mL/min/{1.73_m2} (ref >59)
GFR calc non Af Amer: 80 mL/min/{1.73_m2} (ref >59)
Globulin, Total: 3 g/dL (ref 1.5–4.5)
Glucose: 86 mg/dL (ref 65–99)
Potassium: 3.9 mmol/L (ref 3.5–5.2)
Sodium: 138 mmol/L (ref 134–144)
Total Protein: 7.1 g/dL (ref 6.0–8.5)

## 2019-12-09 LAB — LIPID PANEL
Chol/HDL Ratio: 3.5 ratio (ref 0.0–5.0)
Cholesterol, Total: 206 mg/dL — ABNORMAL HIGH (ref 100–199)
HDL: 59 mg/dL (ref >39)
LDL Chol Calc (NIH): 130 mg/dL — ABNORMAL HIGH (ref 0–99)
Triglycerides: 96 mg/dL (ref 0–149)
VLDL Cholesterol Cal: 17 mg/dL (ref 5–40)

## 2019-12-09 LAB — CBC WITH DIFFERENTIAL/PLATELET
Basophils Absolute: 0.1 10*3/uL (ref 0.0–0.2)
Basos: 1 %
EOS (ABSOLUTE): 0 10*3/uL (ref 0.0–0.4)
Eos: 0 %
Hematocrit: 44.5 % (ref 37.5–51.0)
Hemoglobin: 15 g/dL (ref 13.0–17.7)
Immature Grans (Abs): 0 10*3/uL (ref 0.0–0.1)
Immature Granulocytes: 0 %
Lymphocytes Absolute: 1.4 10*3/uL (ref 0.7–3.1)
Lymphs: 15 %
MCH: 31 pg (ref 26.6–33.0)
MCHC: 33.7 g/dL (ref 31.5–35.7)
MCV: 92 fL (ref 79–97)
Monocytes Absolute: 0.8 10*3/uL (ref 0.1–0.9)
Monocytes: 9 %
Neutrophils Absolute: 6.7 10*3/uL (ref 1.4–7.0)
Neutrophils: 75 %
Platelets: 284 10*3/uL (ref 150–450)
RBC: 4.84 x10E6/uL (ref 4.14–5.80)
RDW: 13.7 % (ref 11.6–15.4)
WBC: 9 10*3/uL (ref 3.4–10.8)

## 2019-12-09 MED ORDER — ATORVASTATIN CALCIUM 20 MG PO TABS: 20.0000 mg | ORAL_TABLET | Freq: Every day | ORAL | 3 refills | Status: DC

## 2019-12-10 ENCOUNTER — Telehealth (INDEPENDENT_AMBULATORY_CARE_PROVIDER_SITE_OTHER): Payer: Self-pay

## 2019-12-10 NOTE — Telephone Encounter (Signed)
-----   Message from Grayce Sessions, NP sent at 12/09/2019  4:46 PM EDT ----- Labs are normal except cholesterol is elevated this  can lead to heart attack and stroke. Decrease your fatty foods, red meat, cheese, milk and increase fiber like whole grains and veggies. Sent in atorvastin 20mg  at bedtime

## 2019-12-10 NOTE — Telephone Encounter (Signed)
Left voicemail notifying patient that his labs are normal except elevated cholesterol which can lead to stroke and heart attack. Atorvastatin 20 mg has been sent to pharmacy to help lower your cholesterol. Take nightly before bed. Please decrease fatty food, red meat, cheese and milk. Increase fiber with whole grains and veggies. Please call our office at [phone] with any questions or concerns. Maryjean Morn, CMA

## 2020-01-07 ENCOUNTER — Ambulatory Visit: Payer: BC Managed Care – PPO | Attending: Internal Medicine

## 2020-01-07 DIAGNOSIS — Z23 Encounter for immunization: Secondary | ICD-10-CM

## 2020-01-07 NOTE — Progress Notes (Signed)
   Covid-19 Vaccination Clinic  Name:  JAVANNI MARING    MRN: [id number] DOB: 1956-02-28  01/07/2020  Mr. Lefebre was observed post Covid-19 immunization for 15 minutes without incident. He was provided with Vaccine Information Sheet and instruction to access the V-Safe system.   Mr. Samad was instructed to call 911 with any severe reactions post vaccine: Marland Kitchen Difficulty breathing  . Swelling of face and throat  . A fast heartbeat  . A bad rash all over body  . Dizziness and weakness   Immunizations Administered    Name Date Dose VIS Date Route   Pfizer COVID-19 Vaccine 01/07/2020  5:09 PM 0.3 mL 09/04/2019 Intramuscular   Manufacturer: [employer], [employer]   Lot: [id number]   NDC: [address]-8

## 2020-01-30 IMAGING — CR DG FINGER MIDDLE 2+V*R*
3 series · 3 of 3 positions shown · non-contrast
Comparison: Right hand x-rays dated July 15, 2016.

CLINICAL DATA: Swelling of the middle finger tip.  No injury.

EXAM:
RIGHT MIDDLE FINGER 2+V

[x finger pa right]
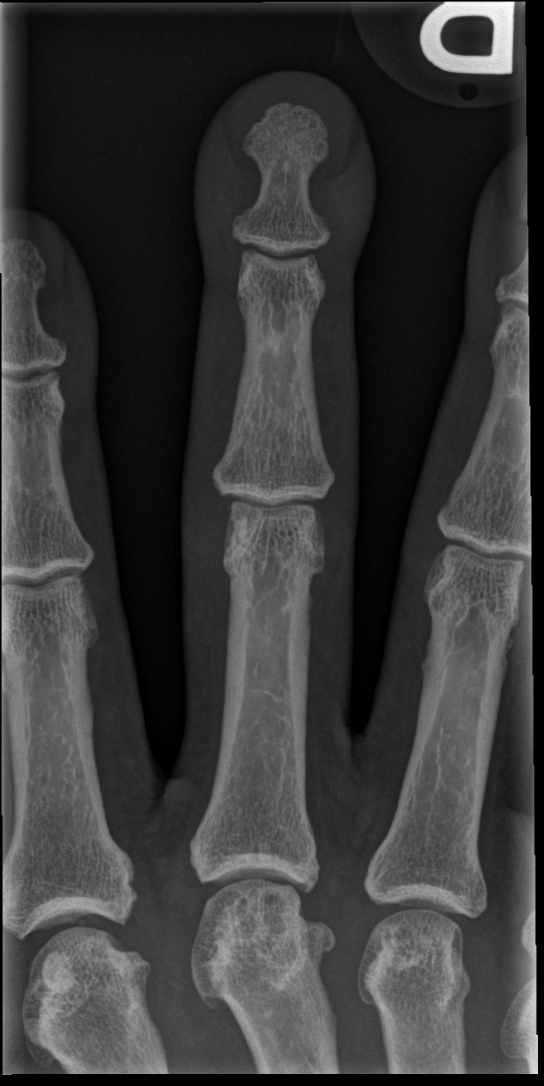

[x finger obl right]
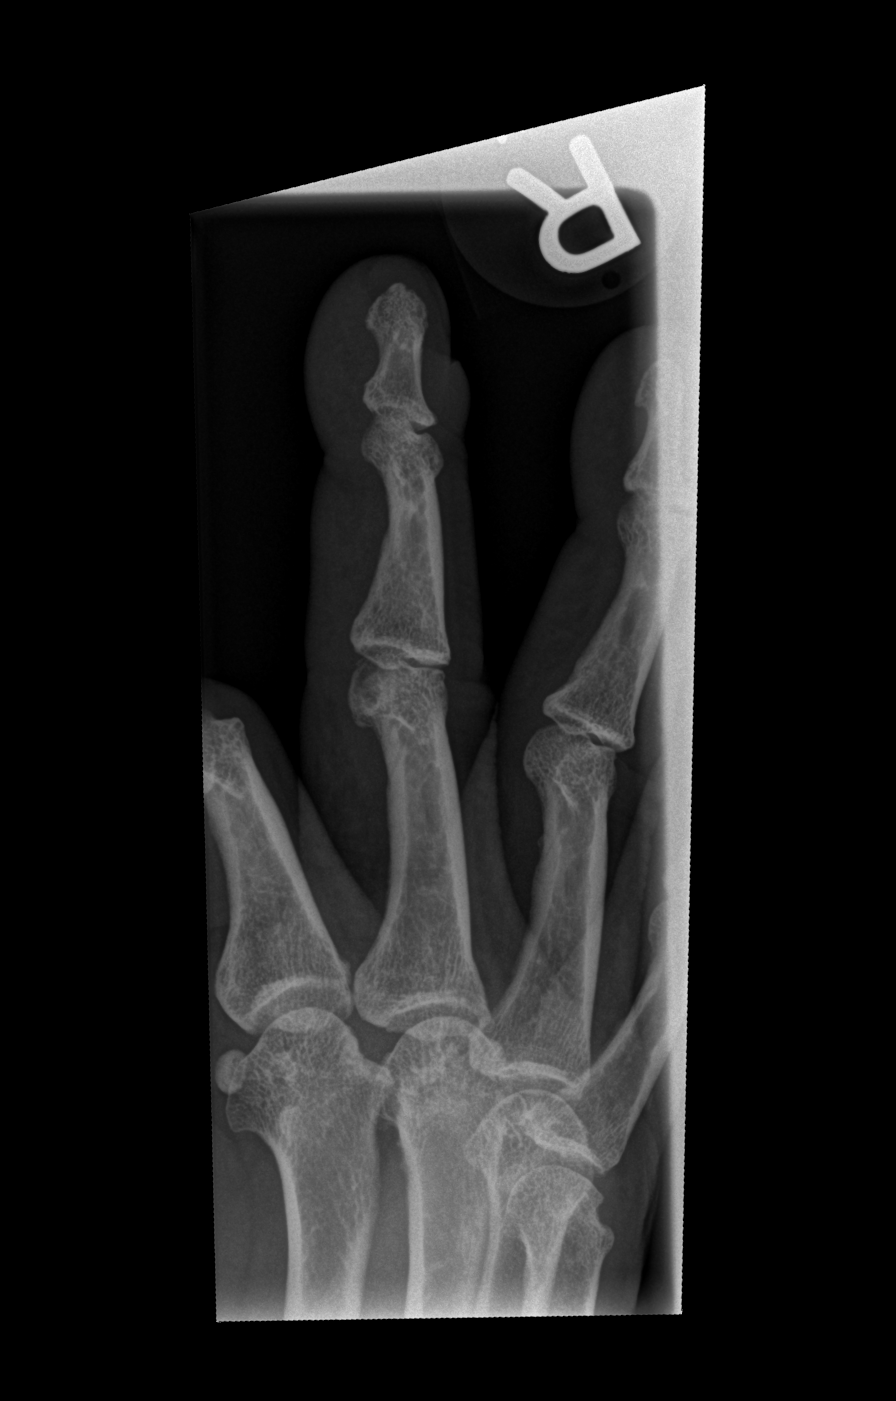

[x finger lat right]
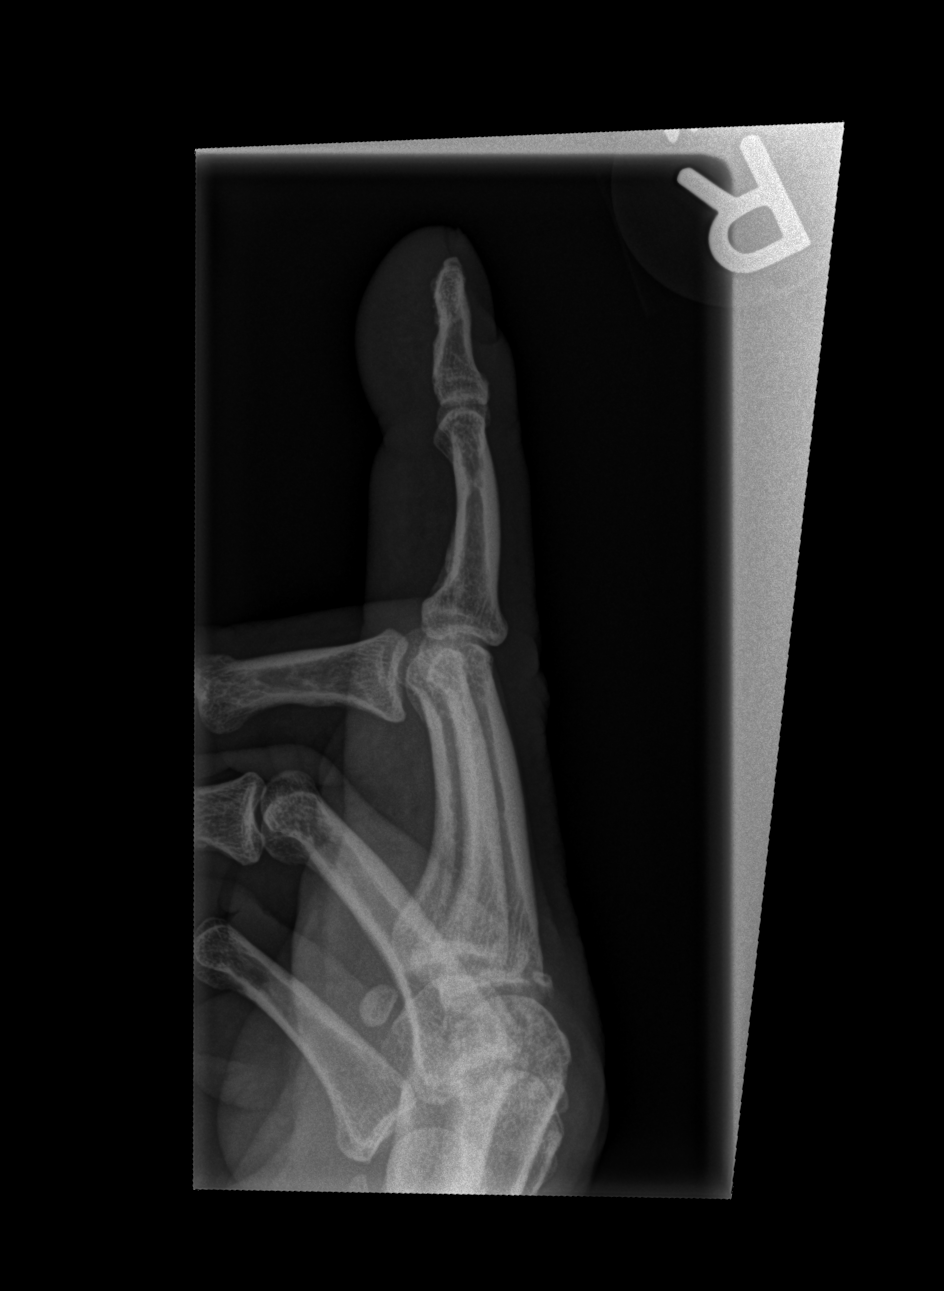

[3 of 3 positions shown; findings below may reference images not displayed]

FINDINGS: No acute fracture or dislocation. Mild osteoarthritis of the second
and third MCP joints. Unchanged clubbing of the distal middle
finger. No radiopaque foreign body identified.
IMPRESSION: 1. Unchanged clubbing of the distal middle finger. No acute osseous
abnormality.

## 2020-02-01 ENCOUNTER — Ambulatory Visit: Payer: BC Managed Care – PPO | Attending: Internal Medicine

## 2020-02-01 DIAGNOSIS — Z23 Encounter for immunization: Secondary | ICD-10-CM

## 2020-02-01 NOTE — Progress Notes (Signed)
   Covid-19 Vaccination Clinic  Name:  Randall Cook    MRN: [id number] DOB: 18-Oct-1955  02/01/2020  Mr. Clear was observed post Covid-19 immunization for 15 minutes without incident. He was provided with Vaccine Information Sheet and instruction to access the V-Safe system.   Mr. Bibbee was instructed to call 911 with any severe reactions post vaccine: Marland Kitchen Difficulty breathing  . Swelling of face and throat  . A fast heartbeat  . A bad rash all over body  . Dizziness and weakness   Immunizations Administered    Name Date Dose VIS Date Route   Pfizer COVID-19 Vaccine 02/01/2020  4:43 PM 0.3 mL 11/18/2018 Intramuscular   Manufacturer: [employer], [employer]   Lot: [id number]   NDC: [address]-4

## 2020-06-06 ENCOUNTER — Other Ambulatory Visit: Payer: Self-pay

## 2020-06-06 ENCOUNTER — Ambulatory Visit (INDEPENDENT_AMBULATORY_CARE_PROVIDER_SITE_OTHER): Payer: BC Managed Care – PPO | Admitting: Primary Care

## 2020-06-06 ENCOUNTER — Encounter (INDEPENDENT_AMBULATORY_CARE_PROVIDER_SITE_OTHER): Payer: Self-pay | Admitting: Primary Care

## 2020-06-06 VITALS — BP 138/87 | HR 79 | Temp 98.0°F | Ht 73.0 in

## 2020-06-06 DIAGNOSIS — Z23 Encounter for immunization: Secondary | ICD-10-CM | POA: Diagnosis not present

## 2020-06-06 DIAGNOSIS — Z1211 Encounter for screening for malignant neoplasm of colon: Secondary | ICD-10-CM | POA: Diagnosis not present

## 2020-06-06 DIAGNOSIS — I10 Essential (primary) hypertension: Secondary | ICD-10-CM

## 2020-06-06 DIAGNOSIS — Z125 Encounter for screening for malignant neoplasm of prostate: Secondary | ICD-10-CM

## 2020-06-06 DIAGNOSIS — E782 Mixed hyperlipidemia: Secondary | ICD-10-CM | POA: Diagnosis not present

## 2020-06-06 DIAGNOSIS — Z1159 Encounter for screening for other viral diseases: Secondary | ICD-10-CM

## 2020-06-06 MED ORDER — LOSARTAN POTASSIUM 50 MG PO TABS: 50.0000 mg | ORAL_TABLET | Freq: Every day | ORAL | 1 refills | Status: DC

## 2020-06-06 MED ORDER — HYDROCHLOROTHIAZIDE 25 MG PO TABS: ORAL_TABLET | ORAL | 1 refills | Status: DC

## 2020-06-06 MED ORDER — ATORVASTATIN CALCIUM 20 MG PO TABS: 20.0000 mg | ORAL_TABLET | Freq: Every day | ORAL | 3 refills | Status: DC

## 2020-06-06 NOTE — Progress Notes (Signed)
Subjective:    Patient here for follow-up of elevated blood pressure.  He is exercising and is adherent to a low-salt diet.  Blood pressure is well controlled at home. Cardiac symptoms: none. Patient denies: chest pain, dyspnea, fatigue, lower extremity edema and palpitations. Cardiovascular risk factors: advanced age (older than 31 for men, 73 for women), dyslipidemia, hypertension, male gender and obesity (BMI >= 30 kg/m2). Use of agents associated with hypertension: NSAIDS. History of target organ damage: none.    Review of Systems Pertinent items are noted in HPI.     Objective:    BP 138/87 (BP Location: Right Arm, Patient Position: Sitting, Cuff Size: Large)   Pulse 79   Temp 98 F (36.7 C) (Oral)   Ht 6\' 1"  (1.854 m)   SpO2 94%   BMI 32.59 kg/m   General Appearance:    Alert, cooperative, no distress, appears stated age  Head:    Normocephalic, without obvious abnormality, atraumatic  Eyes:    PERRL, conjunctiva/corneas clear, EOM's intact, fundi    benign, both eyes       Ears:    Normal TM's and external ear canals, both ears  Nose:   Nares normal, septum midline, mucosa normal, no drainage    or sinus tenderness  Throat:   Lips, mucosa, and tongue normal; teeth and gums normal  Neck:   Supple, symmetrical, trachea midline, no adenopathy;       thyroid:  No enlargement/tenderness/nodules; no carotid   bruit or JVD  Back:     Symmetric, no curvature, ROM normal, no CVA tenderness  Lungs:     Clear to auscultation bilaterally, respirations unlabored  Chest wall:    No tenderness or deformity  Heart:    Regular rate and rhythm, S1 and S2 normal, no murmur, rub   or gallop  Abdomen:     Soft, non-tender, bowel sounds active all four quadrants,    no masses, no organomegaly  Genitalia:    Normal male without lesion, discharge or tenderness  Rectal:    Normal tone, normal prostate, no masses or tenderness;   guaiac negative stool  Extremities:   Extremities normal,  atraumatic, no cyanosis or edema  Pulses:   2+ and symmetric all extremities  Skin:   Skin color, texture, turgor normal, no rashes or lesions  Lymph nodes:   Cervical, supraclavicular, and axillary nodes normal  Neurologic:   CNII-XII intact. Normal strength, sensation and reflexes      throughout      Assessment:    Hypertension, normal blood pressure 138/87. Evidence of target organ damage: none.    Plan:    Medication: continue HCTZ 25mg  and losartan 50 mg take both daily.  Randall Cook was seen today for hypertension.  Diagnoses and all orders for this visit:  Need for immunization against influenza -     Flu Vaccine QUAD 36+ mos IM  Need for Tdap vaccination -     Tdap vaccine greater than 3 equal to 63yo IM  Colon cancer screening -     Ambulatory referral to Gastroenterology -     atorvastatin (LIPITOR) 20 MG tablet; Take 1 tablet (20 mg total) by mouth daily.  Hypertension, unspecified type/Essential hypertension Blood pressure near  goal of  130/80, low-sodium, DASH diet, medication compliance, 150 minutes of moderate intensity exercise per week. Discussed medication compliance, adverse effects. -     losartan (COZAAR) 50 MG tablet; Take 1 tablet (50 mg total) by mouth daily. -  hydrochlorothiazide (HYDRODIURIL) 25 MG tablet; Take 1 tablet daily in the morning  Mixed hyperlipidemia Decrease your fatty foods, red meat, cheese, milk and increase fiber like whole grains and veggies. Continue taking atorvastatin 20mg  at bedtime   Prostate cancer screening PSA  Need for hepatitis C screening test Health maintenance and care gap

## 2020-06-06 NOTE — Patient Instructions (Addendum)
Influenza, Adult Influenza is also called "the flu." It is an infection in the lungs, nose, and throat (respiratory tract). It is caused by a virus. The flu causes symptoms that are similar to symptoms of a cold. It also causes a high fever and body aches. The flu spreads easily from person to person (is contagious). Getting a flu shot (influenza vaccination) every year is the best way to prevent the flu. What are the causes? This condition is caused by the influenza virus. You can get the virus by:  Breathing in droplets that are in the air from the cough or sneeze of a person who has the virus.  Touching something that has the virus on it (is contaminated) and then touching your mouth, nose, or eyes. What increases the risk? Certain things may make you more likely to get the flu. These include:  Not washing your hands often.  Having close contact with many people during cold and flu season.  Touching your mouth, eyes, or nose without first washing your hands.  Not getting a flu shot every year. You may have a higher risk for the flu, along with serious problems such as a lung infection (pneumonia), if you:  Are older than 65.  Are pregnant.  Have a weakened disease-fighting system (immune system) because of a disease or taking certain medicines.  Have a long-term (chronic) illness, such as: ? Heart, kidney, or lung disease. ? Diabetes. ? Asthma.  Have a liver disorder.  Are very overweight (morbidly obese).  Have anemia. This is a condition that affects your red blood cells. What are the signs or symptoms? Symptoms usually begin suddenly and last 4-14 days. They may include:  Fever and chills.  Headaches, body aches, or muscle aches.  Sore throat.  Cough.  Runny or stuffy (congested) nose.  Chest discomfort.  Not wanting to eat as much as normal (poor appetite).  Weakness or feeling tired (fatigue).  Dizziness.  Feeling sick to your stomach (nauseous) or  throwing up (vomiting). How is this treated? If the flu is found early, you can be treated with medicine that can help reduce how bad the illness is and how long it lasts (antiviral medicine). This may be given by mouth (orally) or through an IV tube. Taking care of yourself at home can help your symptoms get better. Your doctor may suggest:  Taking over-the-counter medicines.  Drinking plenty of fluids. The flu often goes away on its own. If you have very bad symptoms or other problems, you may be treated in a hospital. Follow these instructions at home:     Activity  Rest as needed. Get plenty of sleep.  Stay home from work or school as told by your doctor. ? Do not leave home until you do not have a fever for 24 hours without taking medicine. ? Leave home only to visit your doctor. Eating and drinking  Take an ORS (oral rehydration solution). This is a drink that is sold at pharmacies and stores.  Drink enough fluid to keep your pee (urine) pale yellow.  Drink clear fluids in small amounts as you are able. Clear fluids include: ? Water. ? Ice chips. ? Fruit juice that has water added (diluted fruit juice). ? Low-calorie sports drinks.  Eat bland, easy-to-digest foods in small amounts as you are able. These foods include: ? Bananas. ? Applesauce. ? Rice. ? Lean meats. ? Toast. ? Crackers.  Do not eat or drink: ? Fluids that have a lot   of sugar or caffeine. ? Alcohol. ? Spicy or fatty foods. General instructions  Take over-the-counter and prescription medicines only as told by your doctor.  Use a cool mist humidifier to add moisture to the air in your home. This can make it easier for you to breathe.  Cover your mouth and nose when you cough or sneeze.  Wash your hands with soap and water often, especially after you cough or sneeze. If you cannot use soap and water, use alcohol-based hand sanitizer.  Keep all follow-up visits as told by your doctor. This is  important. How is this prevented?   Get a flu shot every year. You may get the flu shot in late summer, fall, or winter. Ask your doctor when you should get your flu shot.  Avoid contact with people who are sick during fall and winter (cold and flu season). Contact a doctor if:  You get new symptoms.  You have: ? Chest pain. ? Watery poop (diarrhea). ? A fever.  Your cough gets worse.  You start to have more mucus.  You feel sick to your stomach.  You throw up. Get help right away if you:  Have shortness of breath.  Have trouble breathing.  Have skin or nails that turn a bluish color.  Have very bad pain or stiffness in your neck.  Get a sudden headache.  Get sudden pain in your face or ear.  Cannot eat or drink without throwing up. Summary  Influenza ("the flu") is an infection in the lungs, nose, and throat. It is caused by a virus.  Take over-the-counter and prescription medicines only as told by your doctor.  Getting a flu shot every year is the best way to avoid getting the flu. This information is not intended to replace advice given to you by your health care provider. Make sure you discuss any questions you have with your health care provider. Document Revised: 02/26/2018 Document Reviewed: 02/26/2018 Elsevier Patient Education  2020 [employer] https://www.cdc.gov/vaccines/hcp/vis/vis-statements/tdap.pdf">  Tdap (Tetanus, Diphtheria, Pertussis) Vaccine: What You Need to Know 1. Why get vaccinated? Tdap vaccine can prevent tetanus, diphtheria, and pertussis. Diphtheria and pertussis spread from person to person. Tetanus enters the body through cuts or wounds.  TETANUS (T) causes painful stiffening of the muscles. Tetanus can lead to serious health problems, including being unable to open the mouth, having trouble swallowing and breathing, or death.  DIPHTHERIA (D) can lead to difficulty breathing, heart failure, paralysis, or death.  PERTUSSIS  (aP), also known as "whooping cough," can cause uncontrollable, violent coughing which makes it hard to breathe, eat, or drink. Pertussis can be extremely serious in babies and young children, causing pneumonia, convulsions, brain damage, or death. In teens and adults, it can cause weight loss, loss of bladder control, passing out, and rib fractures from severe coughing. 2. Tdap vaccine Tdap is only for children 7 years and older, adolescents, and adults.  Adolescents should receive a single dose of Tdap, preferably at age 11 or 12 years. Pregnant women should get a dose of Tdap during every pregnancy, to protect the newborn from pertussis. Infants are most at risk for severe, life-threatening complications from pertussis. Adults who have never received Tdap should get a dose of Tdap. Also, adults should receive a booster dose every 10 years, or earlier in the case of a severe and dirty wound or burn. Booster doses can be either Tdap or Td (a different vaccine that protects against tetanus and diphtheria but not pertussis).   Tdap may be given at the same time as other vaccines. 3. Talk with your health care provider Tell your vaccine provider if the person getting the vaccine:  Has had an allergic reaction after a previous dose of any vaccine that protects against tetanus, diphtheria, or pertussis, or has any severe, life-threatening allergies.  Has had a coma, decreased level of consciousness, or prolonged seizures within 7 days after a previous dose of any pertussis vaccine (DTP, DTaP, or Tdap).  Has seizures or another nervous system problem.  Has ever had Guillain-Barr Syndrome (also called GBS).  Has had severe pain or swelling after a previous dose of any vaccine that protects against tetanus or diphtheria. In some cases, your health care provider may decide to postpone Tdap vaccination to a future visit.  People with minor illnesses, such as a cold, may be vaccinated. People who are  moderately or severely ill should usually wait until they recover before getting Tdap vaccine.  Your health care provider can give you more information. 4. Risks of a vaccine reaction  Pain, redness, or swelling where the shot was given, mild fever, headache, feeling tired, and nausea, vomiting, diarrhea, or stomachache sometimes happen after Tdap vaccine. People sometimes faint after medical procedures, including vaccination. Tell your provider if you feel dizzy or have vision changes or ringing in the ears.  As with any medicine, there is a very remote chance of a vaccine causing a severe allergic reaction, other serious injury, or death. 5. What if there is a serious problem? An allergic reaction could occur after the vaccinated person leaves the clinic. If you see signs of a severe allergic reaction (hives, swelling of the face and throat, difficulty breathing, a fast heartbeat, dizziness, or weakness), call 9-1-1 and get the person to the nearest hospital. For other signs that concern you, call your health care provider.  Adverse reactions should be reported to the Vaccine Adverse Event Reporting System (VAERS). Your health care provider will usually file this report, or you can do it yourself. Visit the VAERS website at www.vaers.LAgents.no or call 607-052-1366. VAERS is only for reporting reactions, and VAERS staff do not give medical advice. 6. The National Vaccine Injury Compensation Program The [employer] Vaccine Injury Compensation Program (VICP) is a federal program that was created to compensate people who may have been injured by certain vaccines. Visit the VICP website at SpiritualWord.at or call 819-416-5370 to learn about the program and about filing a claim. There is a time limit to file a claim for compensation. 7. How can I learn more?  Ask your health care provider.  Call your local or state health department.  Contact the Centers for Disease Control and  Prevention (CDC): ? Call 956-439-7374 (1-800-CDC-INFO) or ? Visit CDC's website at PicCapture.uy Vaccine Information Statement Tdap (Tetanus, Diphtheria, Pertussis) Vaccine (12/24/2018) This information is not intended to replace advice given to you by your health care provider. Make sure you discuss any questions you have with your health care provider. Document Revised: 01/02/2019 Document Reviewed: 01/05/2019 Elsevier Patient Education  2020 [employer].   Managing Your Hypertension Hypertension is commonly called high blood pressure. This is when the force of your blood pressing against the walls of your arteries is too strong. Arteries are blood vessels that carry blood from your heart throughout your body. Hypertension forces the heart to work harder to pump blood, and may cause the arteries to become narrow or stiff. Having untreated or uncontrolled hypertension can cause heart attack, stroke,  kidney disease, and other problems. What are blood pressure readings? A blood pressure reading consists of a higher number over a lower number. Ideally, your blood pressure should be below 120/80. The first ("top") number is called the systolic pressure. It is a measure of the pressure in your arteries as your heart beats. The second ("bottom") number is called the diastolic pressure. It is a measure of the pressure in your arteries as the heart relaxes. What does my blood pressure reading mean? Blood pressure is classified into four stages. Based on your blood pressure reading, your health care provider may use the following stages to determine what type of treatment you need, if any. Systolic pressure and diastolic pressure are measured in a unit called mm Hg. Normal  Systolic pressure: below 120.  Diastolic pressure: below 80. Elevated  Systolic pressure: 120-129.  Diastolic pressure: below 80. Hypertension stage 1  Systolic pressure: 130-139.  Diastolic pressure:  80-89. Hypertension stage 2  Systolic pressure: 140 or above.  Diastolic pressure: 90 or above. What health risks are associated with hypertension? Managing your hypertension is an important responsibility. Uncontrolled hypertension can lead to:  A heart attack.  A stroke.  A weakened blood vessel (aneurysm).  Heart failure.  Kidney damage.  Eye damage.  Metabolic syndrome.  Memory and concentration problems. What changes can I make to manage my hypertension? Hypertension can be managed by making lifestyle changes and possibly by taking medicines. Your health care provider will help you make a plan to bring your blood pressure within a normal range. Eating and drinking   Eat a diet that is high in fiber and potassium, and low in salt (sodium), added sugar, and fat. An example eating plan is called the DASH (Dietary Approaches to Stop Hypertension) diet. To eat this way: ? Eat plenty of fresh fruits and vegetables. Try to fill half of your plate at each meal with fruits and vegetables. ? Eat whole grains, such as whole wheat pasta, brown rice, or whole grain bread. Fill about one quarter of your plate with whole grains. ? Eat low-fat diary products. ? Avoid fatty cuts of meat, processed or cured meats, and poultry with skin. Fill about one quarter of your plate with lean proteins such as fish, chicken without skin, beans, eggs, and tofu. ? Avoid premade and processed foods. These tend to be higher in sodium, added sugar, and fat.  Reduce your daily sodium intake. Most people with hypertension should eat less than 1,500 mg of sodium a day.  Limit alcohol intake to no more than 1 drink a day for nonpregnant women and 2 drinks a day for men. One drink equals 12 oz of beer, 5 oz of wine, or 1 oz of hard liquor. Lifestyle  Work with your health care provider to maintain a healthy body weight, or to lose weight. Ask what an ideal weight is for you.  Get at least 30 minutes of  exercise that causes your heart to beat faster (aerobic exercise) most days of the week. Activities may include walking, swimming, or biking.  Include exercise to strengthen your muscles (resistance exercise), such as weight lifting, as part of your weekly exercise routine. Try to do these types of exercises for 30 minutes at least 3 days a week.  Do not use any products that contain nicotine or tobacco, such as cigarettes and e-cigarettes. If you need help quitting, ask your health care provider.  Control any long-term (chronic) conditions you have, such as high  cholesterol or diabetes. Monitoring  Monitor your blood pressure at home as told by your health care provider. Your personal target blood pressure may vary depending on your medical conditions, your age, and other factors.  Have your blood pressure checked regularly, as often as told by your health care provider. Working with your health care provider  Review all the medicines you take with your health care provider because there may be side effects or interactions.  Talk with your health care provider about your diet, exercise habits, and other lifestyle factors that may be contributing to hypertension.  Visit your health care provider regularly. Your health care provider can help you create and adjust your plan for managing hypertension. Will I need medicine to control my blood pressure? Your health care provider may prescribe medicine if lifestyle changes are not enough to get your blood pressure under control, and if:  Your systolic blood pressure is 130 or higher.  Your diastolic blood pressure is 80 or higher. Take medicines only as told by your health care provider. Follow the directions carefully. Blood pressure medicines must be taken as prescribed. The medicine does not work as well when you skip doses. Skipping doses also puts you at risk for problems. Contact a health care provider if:  You think you are having a  reaction to medicines you have taken.  You have repeated (recurrent) headaches.  You feel dizzy.  You have swelling in your ankles.  You have trouble with your vision. Get help right away if:  You develop a severe headache or confusion.  You have unusual weakness or numbness, or you feel faint.  You have severe pain in your chest or abdomen.  You vomit repeatedly.  You have trouble breathing. Summary  Hypertension is when the force of blood pumping through your arteries is too strong. If this condition is not controlled, it may put you at risk for serious complications.  Your personal target blood pressure may vary depending on your medical conditions, your age, and other factors. For most people, a normal blood pressure is less than 120/80.  Hypertension is managed by lifestyle changes, medicines, or both. Lifestyle changes include weight loss, eating a healthy, low-sodium diet, exercising more, and limiting alcohol. This information is not intended to replace advice given to you by your health care provider. Make sure you discuss any questions you have with your health care provider. Document Revised: 01/02/2019 Document Reviewed: 08/08/2016 Elsevier Patient Education  2020 [employer].

## 2020-06-07 ENCOUNTER — Other Ambulatory Visit (INDEPENDENT_AMBULATORY_CARE_PROVIDER_SITE_OTHER): Payer: BC Managed Care – PPO

## 2020-06-07 DIAGNOSIS — E782 Mixed hyperlipidemia: Secondary | ICD-10-CM

## 2020-06-07 DIAGNOSIS — Z1211 Encounter for screening for malignant neoplasm of colon: Secondary | ICD-10-CM

## 2020-06-07 DIAGNOSIS — I10 Essential (primary) hypertension: Secondary | ICD-10-CM

## 2020-06-08 LAB — CMP14+EGFR
ALT: 13 IU/L (ref 0–44)
AST: 18 IU/L (ref 0–40)
Albumin/Globulin Ratio: 1.3 (ref 1.2–2.2)
Albumin: 3.9 g/dL (ref 3.8–4.8)
Alkaline Phosphatase: 82 IU/L (ref 44–121)
BUN/Creatinine Ratio: 12 (ref 10–24)
BUN: 12 mg/dL (ref 8–27)
Bilirubin Total: 0.5 mg/dL (ref 0.0–1.2)
CO2: 22 mmol/L (ref 20–29)
Calcium: 8.8 mg/dL (ref 8.6–10.2)
Chloride: 105 mmol/L (ref 96–106)
Creatinine, Ser: 1.02 mg/dL (ref 0.76–1.27)
GFR calc Af Amer: 90 mL/min/{1.73_m2} (ref >59)
GFR calc non Af Amer: 78 mL/min/{1.73_m2} (ref >59)
Globulin, Total: 2.9 g/dL (ref 1.5–4.5)
Glucose: 100 mg/dL — ABNORMAL HIGH (ref 65–99)
Potassium: 4.1 mmol/L (ref 3.5–5.2)
Sodium: 141 mmol/L (ref 134–144)
Total Protein: 6.8 g/dL (ref 6.0–8.5)

## 2020-06-08 LAB — PSA: Prostate Specific Ag, Serum: 1.4 ng/mL (ref 0.0–4.0)

## 2020-06-08 LAB — HEPATITIS C ANTIBODY: Hep C Virus Ab: <0.1 s/co ratio (ref 0.0–0.9)

## 2020-06-08 LAB — LIPID PANEL
Chol/HDL Ratio: 3.4 ratio (ref 0.0–5.0)
Cholesterol, Total: 186 mg/dL (ref 100–199)
HDL: 55 mg/dL (ref >39)
LDL Chol Calc (NIH): 118 mg/dL — ABNORMAL HIGH (ref 0–99)
Triglycerides: 67 mg/dL (ref 0–149)
VLDL Cholesterol Cal: 13 mg/dL (ref 5–40)

## 2020-06-10 ENCOUNTER — Telehealth (INDEPENDENT_AMBULATORY_CARE_PROVIDER_SITE_OTHER): Payer: Self-pay

## 2020-06-10 NOTE — Telephone Encounter (Signed)
-----   Message from Grayce Sessions, NP sent at 06/10/2020 10:32 AM EDT ----- PSA normal - labs look good LDL is coming down continue atrovatatin 20 mg at bed time

## 2020-06-10 NOTE — Telephone Encounter (Signed)
Left detailed message on patient phone per DPR. Voicemail informed patient that PSA(prostate) is normal. Labs good, cholesterol coming down but continue taking atorvastatin 20 mg at bedtime. Return call to [facility] at [phone] with any questions or concerns. Maryjean Morn, CMA

## 2020-06-14 ENCOUNTER — Other Ambulatory Visit: Payer: Self-pay

## 2020-06-14 ENCOUNTER — Emergency Department (HOSPITAL_COMMUNITY): Payer: BC Managed Care – PPO

## 2020-06-14 ENCOUNTER — Encounter (HOSPITAL_COMMUNITY): Payer: Self-pay | Admitting: Emergency Medicine

## 2020-06-14 ENCOUNTER — Emergency Department (HOSPITAL_COMMUNITY)
Admission: EM | Admit: 2020-06-14 | Discharge: 2020-06-14 | Disposition: A | Discharge status: Home / Self Care | Payer: BC Managed Care – PPO | Attending: Emergency Medicine | Admitting: Emergency Medicine

## 2020-06-14 DIAGNOSIS — Z79899 Other long term (current) drug therapy: Secondary | ICD-10-CM | POA: Diagnosis not present

## 2020-06-14 DIAGNOSIS — R072 Precordial pain: Secondary | ICD-10-CM | POA: Diagnosis not present

## 2020-06-14 DIAGNOSIS — I10 Essential (primary) hypertension: Secondary | ICD-10-CM | POA: Diagnosis not present

## 2020-06-14 DIAGNOSIS — Z87891 Personal history of nicotine dependence: Secondary | ICD-10-CM | POA: Insufficient documentation

## 2020-06-14 DIAGNOSIS — R079 Chest pain, unspecified: Secondary | ICD-10-CM | POA: Diagnosis present

## 2020-06-14 LAB — CBC
HCT: 49.8 % (ref 39.0–52.0)
Hemoglobin: 15.7 g/dL (ref 13.0–17.0)
MCH: 31.2 pg (ref 26.0–34.0)
MCHC: 31.5 g/dL (ref 30.0–36.0)
MCV: 98.8 fL (ref 80.0–100.0)
Platelets: 235 10*3/uL (ref 150–400)
RBC: 5.04 MIL/uL (ref 4.22–5.81)
RDW: 13.8 % (ref 11.5–15.5)
WBC: 16.2 10*3/uL — ABNORMAL HIGH (ref 4.0–10.5)
nRBC: 0 % (ref 0.0–0.2)

## 2020-06-14 LAB — COMPREHENSIVE METABOLIC PANEL
ALT: 16 U/L (ref 0–44)
AST: 20 U/L (ref 15–41)
Albumin: 3.8 g/dL (ref 3.5–5.0)
Alkaline Phosphatase: 71 U/L (ref 38–126)
Anion gap: 12 (ref 5–15)
BUN: 14 mg/dL (ref 8–23)
CO2: 19 mmol/L — ABNORMAL LOW (ref 22–32)
Calcium: 9.1 mg/dL (ref 8.9–10.3)
Chloride: 107 mmol/L (ref 98–111)
Creatinine, Ser: 1.17 mg/dL (ref 0.61–1.24)
GFR calc Af Amer: >60 mL/min (ref >60)
GFR calc non Af Amer: >60 mL/min (ref >60)
Glucose, Bld: 118 mg/dL — ABNORMAL HIGH (ref 70–99)
Potassium: 3.4 mmol/L — ABNORMAL LOW (ref 3.5–5.1)
Sodium: 138 mmol/L (ref 135–145)
Total Bilirubin: 0.9 mg/dL (ref 0.3–1.2)
Total Protein: 6.9 g/dL (ref 6.5–8.1)

## 2020-06-14 LAB — TROPONIN I (HIGH SENSITIVITY)
Troponin I (High Sensitivity): 9 ng/L (ref <18)
Troponin I (High Sensitivity): 12 ng/L (ref <18)

## 2020-06-14 LAB — LIPASE, BLOOD: Lipase: 31 U/L (ref 11–51)

## 2020-06-14 MED ORDER — FAMOTIDINE IN NACL 20-0.9 MG/50ML-% IV SOLN
20.0000 mg | Freq: Once | INTRAVENOUS | Status: AC
  Administered 2020-06-14: 20 mg via INTRAVENOUS
  Filled 2020-06-14: qty 50

## 2020-06-14 MED ORDER — ONDANSETRON HCL 4 MG/2ML IJ SOLN
4.0000 mg | Freq: Once | INTRAMUSCULAR | Status: AC
  Administered 2020-06-14: 4 mg via INTRAVENOUS
  Filled 2020-06-14: qty 2

## 2020-06-14 MED ORDER — MORPHINE SULFATE (PF) 2 MG/ML IV SOLN
2.0000 mg | Freq: Once | INTRAVENOUS | Status: AC
  Administered 2020-06-14: 2 mg via INTRAVENOUS
  Filled 2020-06-14: qty 1

## 2020-06-14 NOTE — ED Provider Notes (Signed)
[facility] EMERGENCY DEPARTMENT Provider Note   CSN: [id number] Arrival date & time: 06/14/20  9024     History Chief Complaint  Patient presents with  . Near Syncope  . Chest Pain  . Abdominal Pain  . Emesis  . Shortness of Breath    Randall Cook is a 64 y.o. male.  The history is provided by the patient and the spouse.  Chest Pain Pain location:  Substernal area Pain quality: dull and pressure   Pain quality: not radiating, not stabbing and not tearing   Pain radiates to:  Does not radiate Pain severity:  Mild Onset quality:  Unable to specify Duration:  1 day Timing:  Constant Progression:  Improving Context: eating   Context: not breathing and not drug use   Relieved by:  None tried Worsened by:  Nothing Ineffective treatments:  None tried Associated symptoms: diaphoresis, nausea and vomiting   Associated symptoms: no abdominal pain, no cough, no dizziness, no fever, no headache and no shortness of breath    Patient is 64 year old male with hx of HTN, HLD, sleep apnea presenting today for substernal CP that is a pressure like sensation. Non radiating, non exertional and non-pleuritic that began this morning. He ate a smoked sausage sandwhich and then felt nauseated. Vomited and then dry heaved numerous times and felt LH. Briefly sweaty but resolved. However burning CP continued. Is currently 5/10 majorly improved from previously.   No other sx currently.   Per EMS pt did have transient low SpO2 per pulse ox when they saw him initially, was put on oxygen but then taken off and had no other episodes of hypoxia. Patient states was never SOB.     Past Medical History:  Diagnosis Date  . Arthritis   . Dysrhythmia    tachycardia- EKG,clearance 02/29/12 Dr Azucena Cecil  on chart  . GERD (gastroesophageal reflux disease)   . Hyperlipidemia   . Hypertension    LOV,   Dr Azucena Cecil 02/29/12 on chart-   CBC with diff, CMET 02/29/12 on chart  . Sleep apnea     STOP BANG SCORE 4    Patient Active Problem List   Diagnosis Date Noted  . Obesity (BMI 30.0-34.9) 10/02/2019  . Acute hypoxemic respiratory failure due to COVID-19 (HCC) 09/27/2019  . Hypertension 01/07/2018  . Hyperlipidemia 04/09/2017  . 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 NO PAST SURGERIES    . PARTIAL KNEE ARTHROPLASTY  03/10/2012   Procedure: UNICOMPARTMENTAL KNEE;  Surgeon: [name], MD;  Location: [facility] ORS;  Service: Orthopedics;  Laterality: Left;       No family history on file.  Social History   Tobacco Use  . Smoking status: Former Smoker    Packs/day: 0.50    Years: 20.00    Pack years: 10.00    Types: Cigarettes    Quit date: 03/05/1999    Years since quitting: 21.2  . Smokeless tobacco: Never Used  Vaping Use  . Vaping Use: Never used  Substance Use Topics  . Alcohol use: Yes    Comment: none x 20 yrs  . Drug use: No    Home Medications Prior to Admission medications   Medication Sig Start Date End Date Taking? Authorizing Provider  atorvastatin (LIPITOR) 20 MG tablet Take 1 tablet (20 mg total) by mouth daily. 06/06/20   Grayce Sessions, NP  hydrochlorothiazide (HYDRODIURIL) 25 MG tablet Take  1 tablet daily in the morning 06/06/20   Grayce Sessions, NP  ibuprofen (ADVIL,MOTRIN) 600 MG tablet Take 1 tablet (600 mg total) by mouth every 8 (eight) hours as needed. Patient taking differently: Take 600 mg by mouth every 8 (eight) hours as needed for mild pain or moderate pain.  11/24/18   Grayce Sessions, NP  losartan (COZAAR) 50 MG tablet Take 1 tablet (50 mg total) by mouth daily. 06/06/20   Grayce Sessions, NP    Allergies    Penicillins  Review of Systems   Review of Systems  Constitutional: Positive for diaphoresis. Negative for chills and fever.  HENT: Negative for congestion.   Eyes: Negative for pain.  Respiratory: Negative for cough and shortness of breath.   Cardiovascular:  Positive for chest pain. Negative for leg swelling.  Gastrointestinal: Positive for nausea and vomiting. Negative for abdominal pain.  Genitourinary: Negative for dysuria.  Musculoskeletal: Negative for myalgias.  Skin: Negative for rash.  Neurological: Negative for dizziness and headaches.    Physical Exam Updated Vital Signs BP 123/82 (BP Location: Right Arm)   Pulse 66   Temp 98.2 F (36.8 C) (Oral)   Resp (!) 26   Ht 6\' 1"  (1.854 m)   Wt 109.8 kg   SpO2 96%   BMI 31.93 kg/m   Physical Exam Vitals and nursing note reviewed.  Constitutional:      General: He is not in acute distress.    Comments: Pleasant well appearing 64 year old male in no acute distress.  HENT:     Head: Normocephalic and atraumatic.     Nose: Nose normal.  Eyes:     General: No scleral icterus. Cardiovascular:     Rate and Rhythm: Normal rate and regular rhythm.     Pulses: Normal pulses.     Heart sounds: Normal heart sounds.  Pulmonary:     Effort: Pulmonary effort is normal. No respiratory distress.     Breath sounds: Normal breath sounds. No wheezing.  Abdominal:     General: Bowel sounds are normal.     Palpations: Abdomen is soft.     Tenderness: There is no abdominal tenderness. There is no right CVA tenderness, left CVA tenderness, guarding or rebound. Negative signs include Murphy's sign, Rovsing's sign, McBurney's sign, psoas sign and obturator sign.  Musculoskeletal:     Cervical back: Normal range of motion.     Right lower leg: No edema.     Left lower leg: No edema.  Skin:    General: Skin is warm and dry.     Capillary Refill: Capillary refill takes less than 2 seconds.  Neurological:     Mental Status: He is alert. Mental status is at baseline.  Psychiatric:        Mood and Affect: Mood normal.        Behavior: Behavior normal.     ED Results / Procedures / Treatments   Labs (all labs ordered are listed, but only abnormal results are displayed) Labs Reviewed  CBC -  Abnormal; Notable for the following components:      Result Value   WBC 16.2 (*)    All other components within normal limits  COMPREHENSIVE METABOLIC PANEL - Abnormal; Notable for the following components:   Potassium 3.4 (*)    CO2 19 (*)    Glucose, Bld 118 (*)    All other components within normal limits  LIPASE, BLOOD  URINALYSIS, ROUTINE W REFLEX MICROSCOPIC  CBG MONITORING, ED  TROPONIN I (HIGH SENSITIVITY)  TROPONIN I (HIGH SENSITIVITY)    EKG EKG Interpretation  Date/Time:  Tuesday June 14 2020 09:47:17 EDT Ventricular Rate:  77 PR Interval:  176 QRS Duration: 92 QT Interval:  380 QTC Calculation: 430 R Axis:   -11 Text Interpretation: Sinus rhythm with Premature atrial complexes Minimal voltage criteria for LVH, may be normal variant ( R in aVL ) Nonspecific T wave abnormality Abnormal ECG When comapred to prior, slower rate. no STEMI Confirmed by Theda Belfastegeler, Chris ([id number]) on 06/14/2020 7:58:25 PM   Radiology DG Chest 2 View  Result Date: 06/14/2020 CLINICAL DATA:  Chest pain with nausea and vomiting EXAM: CHEST - 2 VIEW COMPARISON:  September 27, 2019. FINDINGS: There is scarring in the right upper lobe, stable. Lungs elsewhere are clear. Heart size and pulmonary vascularity are normal. No adenopathy. No pneumothorax. No bone lesions. IMPRESSION: Stable scarring right upper lobe. Lungs elsewhere clear. Heart size within normal limits. Electronically Signed   By: Bretta BangWilliam  Woodruff III M.D.   On: 06/14/2020 10:23    Procedures Procedures (including critical care time)  Medications Ordered in ED Medications  morphine 2 MG/ML injection 2 mg (2 mg Intravenous Given 06/14/20 2156)  ondansetron (ZOFRAN) injection 4 mg (4 mg Intravenous Given 06/14/20 2156)  famotidine (PEPCID) IVPB 20 mg premix (0 mg Intravenous Stopped 06/14/20 2226)    ED Course  I have reviewed the triage vital signs and the nursing notes.  Pertinent labs & imaging results that were available during  my care of the patient were reviewed by me and considered in my medical decision making (see chart for details).  Patient w/ PMH detailed above presenting today for ongoing CP since this morning. Very atypical for acs but some concern for PE given his questionable hpoxia earlier. I initially [facility]   PE WNLs. Patient still has some CP. Will provide morphine 2mg /zofran and pepcid.  Clinical Course as of Jun 15 29  Wed Jun 15, 2020  0027 EKG reviewed and no change from prior. No significant concerning findings.    [WF]  0027 DG chest shows NAD. Agree with rad--old scarring present.   [WF]  0028 Mild hypkalemia. Will increase dietary K+. Co2 only mildly low.No anion gap or e- abn.  Comprehensive metabolic panel(!) [WF]  0028 Leukocytosis. Likely 2/2 numerous ep of dry heaving and one of vomiting. No fever or tachycardia or evidence of infection.   CBC(!) [WF]  [id number] Troponin x2 WNLs. CP has been constant since this AM but now resolving.  Troponin I (High Sensitivity) [WF]  0029 WNLs doubt pancreatitis.  Lipase, blood [WF]    Clinical Course User Index [WF] Gailen ShelterFondaw, Shawnta Zimbelman S, [place]   MDM Rules/Calculators/A&P                          Patient now pain free.  IV was placed by IV team however appears to have failed to work during imaging.  Patient work-up today was overall reassuring.  Initially, due to the transient low oxygen readings earlier, we decided to try to get a CT scan to rule out blood clot however the team was unable to get the images initially due to issues with IV.   We offered further IV placement to recollect the images however he would rather go home.  Please follow-up with your primary doctor.  If any symptoms change or worsen, please  return to the nearest emergency department immediately.          ----------------------  The medical records were personally reviewed by myself. I personally reviewed all lab results  and interpreted all imaging studies and either concurred with their official read or contacted radiology for clarification. Additional history obtained from old records/EMS/family members.  This patient appears reasonably screened and I doubt any other medical condition requiring further workup, evaluation, or treatment in the ED at this time prior to discharge.   Patient's vitals are WNL apart from vital sign abnormalities discussed above, patient is in NAD, and able to ambulate in the ED at their baseline and able to tolerate PO.  Pain has been managed or a plan has been made for home management and has no complaints prior to discharge. Patient is comfortable with above plan and for discharge at this time. All questions were answered prior to disposition. Results from the ER workup discussed with the patient face to face and all questions answered to the best of my ability. The patient is safe for discharge with strict return precautions. Patient appears safe for discharge with appropriate follow-up. Conveyed my impression with the patient and they voiced understanding and are agreeable to plan.   An After Visit Summary was printed and given to the patient.  Portions of this note were generated with Scientist, clinical (histocompatibility and immunogenetics). Dictation errors may occur despite best attempts at proofreading.    Final Clinical Impression(s) / ED Diagnoses Final diagnoses:  Precordial pain    Rx / DC Orders ED Discharge Orders    None       Gailen Shelter, [place] 06/15/20 0034    Tegeler, Canary Brim, MD 06/15/20 Rickey Primus

## 2020-06-14 NOTE — Discharge Instructions (Addendum)
Your work-up today was overall reassuring.  Initially, due to the transient low oxygen readings earlier, we decided to try to get a CT scan to rule out blood clot however the team was unable to get the images initially.  We offered further IV placement to recollect the images however he would rather go home.  Please follow-up with your primary doctor.  If any symptoms change or worsen, please return to the nearest emergency department immediately.

## 2020-06-14 NOTE — Progress Notes (Signed)
Called to see pt regarding small volume extravasation despite normal flushing of iv.  Minimal swelling noted in left upper arm at IV site.  No significant pain noted.  Would advise observation and cold compresses as needed.

## 2020-06-14 NOTE — ED Triage Notes (Signed)
Arrived via [facility] for near suncope, chest pain, shortness of breath, abdominal pain, and emesis. EMS stated initial pulse oximetry 82% administered 15L NRB and patient took off NRB on room air pulse oximetry 94% room air. Placed on 2L [place] in triage.

## 2020-06-14 NOTE — ED Notes (Signed)
Pt does not want to be stuck anymore. Stated he is refusing another stick and wants to go home. Dr. Julieanne Manson made aware of same.

## 2020-08-27 ENCOUNTER — Ambulatory Visit: Payer: BC Managed Care – PPO | Attending: Internal Medicine

## 2020-08-27 DIAGNOSIS — Z23 Encounter for immunization: Secondary | ICD-10-CM

## 2020-08-27 NOTE — Progress Notes (Signed)
   Covid-19 Vaccination Clinic  Name:  Randall Cook    MRN: [id number] DOB: Feb 12, 1956  08/27/2020  [name] was observed post Covid-19 immunization for 15 minutes without incident. He was provided with Vaccine Information Sheet and instruction to access the V-Safe system.   [name] was instructed to call 911 with any severe reactions post vaccine: Marland Kitchen Difficulty breathing  . Swelling of face and throat  . A fast heartbeat  . A bad rash all over body  . Dizziness and weakness   Immunizations Administered    Name Date Dose VIS Date Route   Pfizer COVID-19 Vaccine 08/27/2020 10:17 AM 0.3 mL 07/13/2020 Intramuscular   Manufacturer: [employer], [employer]   Lot: [id number]   NDC: [address]-3

## 2020-12-05 ENCOUNTER — Other Ambulatory Visit: Payer: Self-pay

## 2020-12-05 ENCOUNTER — Ambulatory Visit (INDEPENDENT_AMBULATORY_CARE_PROVIDER_SITE_OTHER): Payer: BC Managed Care – PPO | Admitting: Primary Care

## 2020-12-05 ENCOUNTER — Encounter (INDEPENDENT_AMBULATORY_CARE_PROVIDER_SITE_OTHER): Payer: Self-pay | Admitting: Primary Care

## 2020-12-05 VITALS — BP 146/104 | HR 84 | Temp 97.3°F | Ht 73.0 in | Wt 262.8 lb

## 2020-12-05 DIAGNOSIS — I10 Essential (primary) hypertension: Secondary | ICD-10-CM

## 2020-12-05 DIAGNOSIS — M25511 Pain in right shoulder: Secondary | ICD-10-CM | POA: Diagnosis not present

## 2020-12-05 DIAGNOSIS — G8929 Other chronic pain: Secondary | ICD-10-CM

## 2020-12-05 DIAGNOSIS — E669 Obesity, unspecified: Secondary | ICD-10-CM

## 2020-12-05 DIAGNOSIS — M25512 Pain in left shoulder: Secondary | ICD-10-CM

## 2020-12-05 MED ORDER — METHOCARBAMOL 500 MG PO TABS: 500.0000 mg | ORAL_TABLET | Freq: Three times a day (TID) | ORAL | 1 refills | Status: DC | PRN

## 2020-12-05 NOTE — Progress Notes (Signed)
Established Patient Office Visit  Subjective:  Patient ID: Randall Cook, male    DOB: 15-Mar-1956  Age: 65 y.o. MRN: [id number]  CC:  Chief Complaint  Patient presents with  . Hypertension    HPI Randall Cook is 2 year obese male who presents for management of hypertension. Denies shortness of breath, headaches, chest pain or lower extremity edema, sudden onset, vision changes, unilateral weakness, dizziness, paresthesias.Bp elevated no medication change since last visit and BP was unremarkable. Asked would stress cause Bp to go up. 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 Admits to eating a bag of chips and enjoys snacking. Bilateral shoulder pain repetitive motion 20 plus years.   Past Medical History:  Diagnosis Date  . Arthritis   . Dysrhythmia    tachycardia- EKG,clearance 02/29/12 Dr Azucena Cecil  on chart  . GERD (gastroesophageal reflux disease)   . Hyperlipidemia   . Hypertension    LOV,   Dr Azucena Cecil 02/29/12 on chart-   CBC with diff, CMET 02/29/12 on chart  . Sleep apnea    STOP BANG SCORE 4    Past Surgical History:  Procedure Laterality Date  . NO PAST SURGERIES    . PARTIAL KNEE ARTHROPLASTY  03/10/2012   Procedure: UNICOMPARTMENTAL KNEE;  Surgeon: [name], MD;  Location: [facility];  Service: Orthopedics;  Laterality: Left;    No family history on file.  Social History   Socioeconomic History  . Marital status: Married    Spouse name: Not on file  . Number of children: Not on file  . Years of education: Not on file  . Highest education level: Not on file  Occupational History  . Not on file  Tobacco Use  . Smoking status: Former Smoker    Packs/day: 0.50    Years: 20.00    Pack years: 10.00    Types: Cigarettes    Quit date: 03/05/1999    Years since quitting: 21.7  . Smokeless tobacco: Never Used  Vaping Use  . Vaping Use: Never used  Substance and Sexual Activity  . Alcohol use: Yes    Comment: none x 20 yrs  . Drug  use: No  . Sexual activity: Not on file  Other Topics Concern  . Not on file  Social History Narrative  . Not on file   Social Determinants of Health   Financial Resource Strain: Not on file  Food Insecurity: Not on file  Transportation Needs: Not on file  Physical Activity: Not on file  Stress: Not on file  Social Connections: Not on file  Intimate Partner Violence: Not on file    Outpatient Medications Prior to Visit  Medication Sig Dispense Refill  . atorvastatin (LIPITOR) 20 MG tablet Take 1 tablet (20 mg total) by mouth daily. 90 tablet 3  . hydrochlorothiazide (HYDRODIURIL) 25 MG tablet Take 1 tablet daily in the morning 90 tablet 1  . ibuprofen (ADVIL,MOTRIN) 600 MG tablet Take 1 tablet (600 mg total) by mouth every 8 (eight) hours as needed. (Patient taking differently: Take 600 mg by mouth every 8 (eight) hours as needed for mild pain or moderate pain.) 30 tablet 0  . losartan (COZAAR) 50 MG tablet Take 1 tablet (50 mg total) by mouth daily. 90 tablet 1   No facility-administered medications prior to visit.    Allergies  Allergen Reactions  . Penicillins Rash    Has patient had a PCN reaction causing immediate  rash, facial/tongue/throat swelling, SOB or lightheadedness with hypotension: Unknown Has patient had a PCN reaction causing severe rash involving mucus membranes or skin necrosis: No  Has patient had a PCN reaction that required hospitalization: No  Has patient had a PCN reaction occurring within the last 10 years: Yes  If all of the above answers are "NO", then may proceed with Cephalosporin use.     ROS Review of Systems  Respiratory: Positive for shortness of breath.        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     Objective:    Physical Exam Vitals reviewed.  Constitutional:      Appearance: He is obese.  HENT:     Right Ear:  External ear normal.     Left Ear: External ear normal.     Nose: Nose normal.  Eyes:     Extraocular Movements: Extraocular movements intact.  Cardiovascular:     Rate and Rhythm: Normal rate and regular rhythm.  Pulmonary:     Effort: Pulmonary effort is normal.     Breath sounds: Normal breath sounds.  Abdominal:     General: Bowel sounds are normal. There is distension.     Palpations: Abdomen is soft.  Musculoskeletal:        General: Tenderness present. Normal range of motion.     Cervical back: Normal range of motion and neck supple.     Comments: Stiffness with raising arms  Skin:    General: Skin is warm and dry.  Neurological:     Mental Status: He is alert and oriented to person, place, and time.  Psychiatric:        Mood and Affect: Mood normal.        Behavior: Behavior normal.        Thought Content: Thought content normal.        Judgment: Judgment normal.     BP (!) 146/104 (BP Location: Right Arm, Patient Position: Sitting, Cuff Size: Large)   Pulse 84   Temp (!) 97.3 F (36.3 C) (Temporal)   Ht 6\' 1"  (1.854 m)   Wt 262 lb 12.8 oz (119.2 kg)   SpO2 93%   BMI 34.67 kg/m  Wt Readings from Last 3 Encounters:  12/05/20 262 lb 12.8 oz (119.2 kg)  06/14/20 242 lb (109.8 kg)  12/03/19 247 lb (112 kg)     Health Maintenance Due  Topic Date Due  . TETANUS/TDAP  Never done  . COLONOSCOPY (Pts 45-64yrs Insurance coverage will need to be confirmed)  Never done    There are no preventive care reminders to display for this patient.  No results found for: TSH Lab Results  Component Value Date   WBC 16.2 (H) 06/14/2020   HGB 15.7 06/14/2020   HCT 49.8 06/14/2020   MCV 98.8 06/14/2020   PLT 235 06/14/2020   Lab Results  Component Value Date   NA 138 06/14/2020   K 3.4 (L) 06/14/2020   CO2 19 (L) 06/14/2020   GLUCOSE 118 (H) 06/14/2020   BUN 14 06/14/2020   CREATININE 1.17 06/14/2020   BILITOT 0.9 06/14/2020   ALKPHOS 71 06/14/2020   AST 20  06/14/2020   ALT 16 06/14/2020   PROT 6.9 06/14/2020   ALBUMIN 3.8 06/14/2020   CALCIUM 9.1 06/14/2020   ANIONGAP 12 06/14/2020  Lab Results  Component Value Date   CHOL 186 06/07/2020   Lab Results  Component Value Date   HDL 55 06/07/2020   Lab Results  Component Value Date   LDLCALC 118 (H) 06/07/2020   Lab Results  Component Value Date   TRIG 67 06/07/2020   Lab Results  Component Value Date   CHOLHDL 3.4 06/07/2020   No results found for: HGBA1C    Assessment & Plan:  Lyon was seen today for hypertension.  Diagnoses and all orders for this visit:  Primary hypertension Counseled on blood pressure goal of less than 130/80, low-sodium, DASH diet, medication compliance, 150 minutes of moderate intensity exercise per week. Discussed medication compliance, adverse effects. (discussed cutting down snacks (salty)  Losing weight . Admits to drinking 24 oz of coffee in AM ask to decrease oz. Continue same Bp medicines   Chronic pain of both shoulders Work on losing weight to help reduce joint pain. May alternate with heat and ice application for pain relief. May also alternate with acetaminophen and Ibuprofen as prescribed pain relief. Other alternatives include massage, acupuncture and water aerobics.  You must stay active and avoid a sedentary lifestyle. 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  Obesity (BMI 30.0-34.9) Obesity is 30-39 indicating an excess in caloric intake or underlining conditions. This may lead to other co-morbidities. Lifestyle modifications of diet and exercise may reduce obesity. ( DM, respiratory complications)  Other orders -     methocarbamol (ROBAXIN) 500 MG tablet; Take 1 tablet (500 mg total) by mouth every 8 (eight) hours as needed for muscle spasms.   Meds ordered this encounter  Medications  . methocarbamol (ROBAXIN) 500 MG tablet    Sig: Take 1 tablet (500 mg total) by mouth every 8 (eight) hours as needed for muscle  spasms.    Dispense:  90 tablet    Refill:  1    Follow-up: Return in about 4 weeks (around 01/02/2021) for Bp re ck .    Grayce Sessions, NP

## 2021-01-02 ENCOUNTER — Ambulatory Visit (INDEPENDENT_AMBULATORY_CARE_PROVIDER_SITE_OTHER): Payer: BC Managed Care – PPO | Admitting: Primary Care

## 2021-01-11 ENCOUNTER — Encounter (INDEPENDENT_AMBULATORY_CARE_PROVIDER_SITE_OTHER): Payer: Self-pay | Admitting: Primary Care

## 2021-01-11 ENCOUNTER — Ambulatory Visit (INDEPENDENT_AMBULATORY_CARE_PROVIDER_SITE_OTHER): Payer: BC Managed Care – PPO | Admitting: Primary Care

## 2021-01-11 ENCOUNTER — Other Ambulatory Visit: Payer: Self-pay

## 2021-01-11 VITALS — BP 130/75 | HR 62 | Temp 97.5°F | Ht 73.0 in | Wt 261.8 lb

## 2021-01-11 DIAGNOSIS — Z23 Encounter for immunization: Secondary | ICD-10-CM | POA: Diagnosis not present

## 2021-01-11 DIAGNOSIS — I10 Essential (primary) hypertension: Secondary | ICD-10-CM | POA: Diagnosis not present

## 2021-01-11 DIAGNOSIS — E782 Mixed hyperlipidemia: Secondary | ICD-10-CM

## 2021-01-11 DIAGNOSIS — Z760 Encounter for issue of repeat prescription: Secondary | ICD-10-CM | POA: Diagnosis not present

## 2021-01-11 DIAGNOSIS — Z1211 Encounter for screening for malignant neoplasm of colon: Secondary | ICD-10-CM | POA: Diagnosis not present

## 2021-01-11 MED ORDER — LOSARTAN POTASSIUM 50 MG PO TABS: 50.0000 mg | ORAL_TABLET | Freq: Every day | ORAL | 1 refills | Status: DC

## 2021-01-11 MED ORDER — ATORVASTATIN CALCIUM 20 MG PO TABS: 20.0000 mg | ORAL_TABLET | Freq: Every day | ORAL | 3 refills | Status: DC

## 2021-01-11 MED ORDER — HYDROCHLOROTHIAZIDE 25 MG PO TABS: ORAL_TABLET | ORAL | 1 refills | Status: DC

## 2021-01-11 NOTE — Progress Notes (Signed)
[facility]    Mr. Justyn Langham is a 65 year old male who presents for  hypertension evaluation, on previous visit medication was adjusted to include losartan 50 mg and HCTZ 25 mg. Denies shortness of breath, headaches, chest pain or lower extremity edema, sudden onset, vision changes, unilateral weakness, dizziness, paresthesias. Patient reports adherence with medications.  I am very pleased with Mr. Cocozza blood pressure today and excited  Current Medication List Current Outpatient Medications on File Prior to Visit  Medication Sig Dispense Refill  . ibuprofen (ADVIL,MOTRIN) 600 MG tablet Take 1 tablet (600 mg total) by mouth every 8 (eight) hours as needed. (Patient taking differently: Take 600 mg by mouth every 8 (eight) hours as needed for mild pain or moderate pain.) 30 tablet 0  . methocarbamol (ROBAXIN) 500 MG tablet Take 1 tablet (500 mg total) by mouth every 8 (eight) hours as needed for muscle spasms. 90 tablet 1   No current facility-administered medications on file prior to visit.   Past Medical History  Past Medical History:  Diagnosis Date  . Arthritis   . Dysrhythmia    tachycardia- EKG,clearance 02/29/12 Dr Azucena Cecil  on chart  . GERD (gastroesophageal reflux disease)   . Hyperlipidemia   . Hypertension    LOV,   Dr Azucena Cecil 02/29/12 on chart-   CBC with diff, CMET 02/29/12 on chart  . Sleep apnea    STOP BANG SCORE 4   Dietary habits include: Monitoring sodium in diet Exercise habits include: walking as tolerated the weather is getting better Family / Social history: Unknown  ASCVD risk factors include- [place]  O:  Physical Exam Vitals:   01/11/21 1615 01/11/21 1631  BP: (!) 142/96 130/75  Pulse: 73 62  Temp: (!) 97.5 F (36.4 C)   TempSrc: Temporal   SpO2: 95%   Weight: 261 lb 12.8 oz (118.8 kg)   Height: 6\' 1"  (1.854 m)    General: Vital signs reviewed.  Patient is well-developed and well-nourished, obese male in no acute distress and cooperative  with exam.  Head: Normocephalic and atraumatic. Eyes: EOMI, conjunctivae normal, no scleral icterus.  Neck: Supple, trachea midline, normal ROM, no JVD, masses, thyromegaly, or carotid bruit present.  Cardiovascular: RRR, S1 normal, S2 normal, no murmurs, gallops, or rubs. Pulmonary/Chest: Clear to auscultation bilaterally, no wheezes, rales, or rhonchi. Abdominal: Soft, non-tender, non-distended, BS +,  Musculoskeletal: No joint deformities, erythema, or stiffness, ROM full and nontender. Extremities: No lower extremity edema bilaterally,  pulses symmetric and intact bilaterally. No cyanosis or clubbing. Neurological: A&O x3,  Skin: Warm, dry and intact. No rashes or erythema. Psychiatric: Normal mood and affect. speech and behavior is normal. Cognition and memory are normal.   Review of Systems  Musculoskeletal: Positive for back pain.       Chronic  All other systems reviewed and are negative.   Last 3 Office BP readings: BP Readings from Last 3 Encounters:  01/11/21 130/75  12/05/20 (!) 146/104  06/14/20 123/82    BMET    Component Value Date/Time   NA 138 06/14/2020 1056   NA 141 06/07/2020 0851   K 3.4 (L) 06/14/2020 1056   CL 107 06/14/2020 1056   CO2 19 (L) 06/14/2020 1056   GLUCOSE 118 (H) 06/14/2020 1056   BUN 14 06/14/2020 1056   BUN 12 06/07/2020 0851   CREATININE 1.17 06/14/2020 1056   CREATININE 0.91 07/27/2013 1140   CALCIUM 9.1 06/14/2020 1056   GFRNONAA >60 06/14/2020 1056   GFRAA >  60 06/14/2020 1056    Renal function: CrCl cannot be calculated (Patient's most recent lab result is older than the maximum 21 days allowed.).  Clinical ASCVD: Yes  The 10-year ASCVD risk score Denman George DC Jr., et al., 2013) is: 14.7%   Values used to calculate the score:     Age: 14 years     Sex: Male     Is Non-Hispanic African American: Yes     Diabetic: No     Tobacco smoker: No     Systolic Blood Pressure: 130 mmHg     Is BP treated: Yes     HDL Cholesterol: 55  mg/dL     Total Cholesterol: 186 mg/dL  Randall Cook was seen today for blood pressure check.  Diagnoses and all orders for this visit:  Need for Tdap vaccination -     Tdap vaccine greater than or equal to 7yo IM  Colon cancer screening -     Ambulatory referral to Gastroenterology  Mixed hyperlipidemia Continue to decrease your fatty foods, red meat, cheese, milk and increase fiber like whole grains and veggies. You can also add a fiber supplement like Metamucil or Benefiber.  -     atorvastatin (LIPITOR) 20 MG tablet; Take 1 tablet (20 mg total) by mouth daily.   Hypertension, unspecified type -     Hypertension longstanding diagnosed currently losartan 50 mg and HCTZ 25 mg.on current medications. BP Goal = 130/80 mmHg. Patient is adherent with current medications.  -Continued  -F/u labs ordered -none -Counseled on lifestyle modifications for blood pressure control including reduced dietary sodium, increased exercise, adequate sleep Kristoff was seen today for blood pressure check.  Medication refill -     losartan (COZAAR) 50 MG tablet; Take 1 tablet (50 mg total) by mouth daily. -     atorvastatin (LIPITOR) 20 MG tablet; Take 1 tablet (20 mg total) by mouth daily. -     hydrochlorothiazide (HYDRODIURIL) 25 MG tablet; Take 1 tablet daily in the morning   Grayce Sessions

## 2021-01-11 NOTE — Patient Instructions (Signed)

## 2021-05-11 ENCOUNTER — Encounter (HOSPITAL_COMMUNITY): Payer: Self-pay | Admitting: Emergency Medicine

## 2021-05-11 ENCOUNTER — Other Ambulatory Visit: Payer: Self-pay

## 2021-05-11 ENCOUNTER — Ambulatory Visit (HOSPITAL_COMMUNITY)
Admission: EM | Admit: 2021-05-11 | Discharge: 2021-05-11 | Disposition: A | Discharge status: Home / Self Care | Payer: BC Managed Care – PPO | Attending: Physician Assistant | Admitting: Physician Assistant

## 2021-05-11 DIAGNOSIS — J069 Acute upper respiratory infection, unspecified: Secondary | ICD-10-CM | POA: Diagnosis not present

## 2021-05-11 DIAGNOSIS — U071 COVID-19: Secondary | ICD-10-CM | POA: Diagnosis not present

## 2021-05-11 DIAGNOSIS — R059 Cough, unspecified: Secondary | ICD-10-CM | POA: Insufficient documentation

## 2021-05-11 DIAGNOSIS — R519 Headache, unspecified: Secondary | ICD-10-CM | POA: Insufficient documentation

## 2021-05-11 LAB — SARS CORONAVIRUS 2 (TAT 6-24 HRS): SARS Coronavirus 2: POSITIVE — AB

## 2021-05-11 MED ORDER — BENZONATATE 100 MG PO CAPS: 100.0000 mg | ORAL_CAPSULE | Freq: Three times a day (TID) | ORAL | 0 refills | Status: DC

## 2021-05-11 NOTE — Discharge Instructions (Addendum)
We will contact you if your COVID-19 test is positive within the next few days.  Please remain in isolation until you receive these results.  You can continue using over-the-counter medications for symptom relief.  I recommended Flonase and Mucinex for congestion and cough, Tylenol for fever and pain, gargling with warm salt water for sore throat.  You can use Tessalon up to 3 times a day as needed for cough.  If you develop any worsening symptoms including high fever not responding to medication, shortness of breath, chest pain, severe cough you need to be reevaluated immediately as we discussed.

## 2021-05-11 NOTE — ED Triage Notes (Signed)
Pt is present today with exposure to Covid. Pt states that he has a cough and slight headache. Pt states that his HA started yesterday

## 2021-05-11 NOTE — ED Provider Notes (Signed)
[facility]    CSN: [id number] Arrival date & time: 05/11/21  0800      History   Chief Complaint Chief Complaint  Patient presents with   Headache    HPI Randall Cook is a 65 y.o. male.   Patient presents today with a 2-day history of headache.  He reports associated diarrhea which has resolved, cough, nasal congestion, fatigue.  He denies any fever, chest pain, shortness of breath, nausea, vomiting, abdominal pain.  Reports that diarrhea has resolved and headache has improved.  He is interested in being tested for COVID-19 as his wife was sick with similar symptoms and had a positive at-home test.  Patient took an at home test that was negative but is unsure if this is reliable.  He has had COVID-19 vaccinations and booster.  He has tried several over-the-counter medications including Alka-Seltzer plus, Goody powder, Chloraseptic spray.  He denies any history of diabetes, cardiovascular disease, chronic lung condition, immunosuppression.  He has had COVID in the past that resulted in chronic lung damage.  Reports current symptoms are similar to previous episodes of this condition.  He is requesting work excuse note until results are obtained.   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 Authorizing Provider  benzonatate (TESSALON) 100 MG capsule Take 1 capsule (100 mg total) by mouth every 8 (eight) hours. 05/11/21  Yes Stephanieann Popescu K, PA-C  atorvastatin (LIPITOR) 20 MG tablet Take 1 tablet (20 mg total) by mouth daily. 01/11/21   Grayce Sessions, NP  hydrochlorothiazide (HYDRODIURIL) 25 MG tablet Take 1 tablet daily in the morning 01/11/21   Grayce Sessions, NP  ibuprofen (ADVIL,MOTRIN) 600 MG tablet Take 1 tablet (600 mg total) by mouth every 8 (eight) hours as needed. Patient taking differently: Take 600 mg by mouth every 8 (eight) hours as needed for mild pain or moderate pain. 11/24/18   Grayce Sessions, NP  losartan (COZAAR) 50 MG tablet Take 1 tablet (50 mg total) by mouth daily. 01/11/21   Grayce Sessions, NP  methocarbamol (ROBAXIN) 500 MG tablet Take 1 tablet (500 mg total) by mouth every 8 (eight) hours as needed for muscle spasms. 12/05/20   Grayce Sessions, NP    Family History History reviewed. No pertinent family history.  Social History Social History   Tobacco Use   Smoking status: Former    Packs/day: 0.50    Years: 20.00    Pack years: 10.00  Types: Cigarettes    Quit date: 03/05/1999    Years since quitting: 22.2   Smokeless tobacco: Never  Vaping Use   Vaping Use: Never used  Substance Use Topics   Alcohol use: Yes    Comment: none x 20 yrs   Drug use: No     Allergies   Penicillins   Review of Systems Review of Systems  Constitutional:  Positive for activity change and fever. Negative for appetite change and fatigue.  HENT:  Negative for congestion, sinus pressure, sneezing and sore throat.   Respiratory:  Positive for cough. Negative for shortness of breath.   Cardiovascular:  Negative for chest pain.  Gastrointestinal:  Negative for  abdominal pain, diarrhea (improved), nausea and vomiting.  Musculoskeletal:  Negative for arthralgias and myalgias.  Neurological:  Negative for dizziness, light-headedness and headaches (improved).    Physical Exam Triage Vital Signs ED Triage Vitals  Enc Vitals Group     BP 05/11/21 0827 (!) 152/103     Pulse Rate 05/11/21 0825 69     Resp 05/11/21 0825 17     Temp 05/11/21 0825 98.4 F (36.9 C)     Temp src --      SpO2 05/11/21 0825 96 %     Weight --      Height --      Head Circumference --      Peak Flow --      Pain Score 05/11/21 0825 0     Pain Loc --      Pain Edu? --      Excl. in GC? --    No data found.  Updated Vital Signs BP (!) 152/103   Pulse 69   Temp 98.4 F (36.9 C)   Resp 17   SpO2 96%   Visual Acuity Right Eye Distance:   Left Eye Distance:   Bilateral Distance:    Right Eye Near:   Left Eye Near:    Bilateral Near:     Physical Exam Vitals reviewed.  Constitutional:      General: He is awake.     Appearance: Normal appearance. He is normal weight. He is not ill-appearing.     Comments: Very pleasant male appears stated age in no acute distress  HENT:     Head: Normocephalic and atraumatic.     Right Ear: Tympanic membrane, ear canal and external ear normal. Tympanic membrane is not erythematous or bulging.     Left Ear: Tympanic membrane, ear canal and external ear normal. Tympanic membrane is not erythematous or bulging.     Nose: Nose normal.     Mouth/Throat:     Pharynx: Uvula midline. Posterior oropharyngeal erythema present. No oropharyngeal exudate.  Cardiovascular:     Rate and Rhythm: Normal rate and regular rhythm.     Heart sounds: Normal heart sounds, S1 normal and S2 normal. No murmur heard. Pulmonary:     Effort: Pulmonary effort is normal. No accessory muscle usage or respiratory distress.     Breath sounds: Normal breath sounds. No stridor. No wheezing, rhonchi or rales.     Comments: Clear to auscultation  bilaterally Abdominal:     General: Bowel sounds are normal.     Palpations: Abdomen is soft.     Tenderness: There is no abdominal tenderness.  Lymphadenopathy:     Head:     Right side of head: No submental, submandibular or tonsillar adenopathy.     Left side of head: No  submental, submandibular or tonsillar adenopathy.     Cervical: No cervical adenopathy.  Neurological:     Mental Status: He is alert.  Psychiatric:        Behavior: Behavior is cooperative.     UC Treatments / Results  Labs (all labs ordered are listed, but only abnormal results are displayed) Labs Reviewed  SARS CORONAVIRUS 2 (TAT 6-24 HRS)    EKG   Radiology No results found.  Procedures Procedures (including critical care time)  Medications Ordered in UC Medications - No data to display  Initial Impression / Assessment and Plan / UC Course  I have reviewed the triage vital signs and the nursing notes.  Pertinent labs & imaging results that were available during my care of the patient were reviewed by me and considered in my medical decision making (see chart for details).      Discussed that symptoms are most likely related to COVID-19 given known household contacts that have recently tested positive.  COVID-19 test is pending.  Patient was encouraged use over-the-counter medications for symptom management.  He was prescribed Tessalon to be used as needed for cough.  He was given work excuse note with current CDC return to work guidelines based on testing result.  Discussed alarm symptoms that warrant emergent evaluation.  Encourage patient to follow-up with PCP within 1 week to ensure symptom improvement.  Strict return precautions given to which patient expressed understanding.  Final Clinical Impressions(s) / UC Diagnoses   Final diagnoses:  Upper respiratory tract infection, unspecified type  Nonintractable headache, unspecified chronicity pattern, unspecified headache type  Cough      Discharge Instructions      We will contact you if your COVID-19 test is positive within the next few days.  Please remain in isolation until you receive these results.  You can continue using over-the-counter medications for symptom relief.  I recommended Flonase and Mucinex for congestion and cough, Tylenol for fever and pain, gargling with warm salt water for sore throat.  You can use Tessalon up to 3 times a day as needed for cough.  If you develop any worsening symptoms including high fever not responding to medication, shortness of breath, chest pain, severe cough you need to be reevaluated immediately as we discussed.     ED Prescriptions     Medication Sig Dispense Auth. Provider   benzonatate (TESSALON) 100 MG capsule Take 1 capsule (100 mg total) by mouth every 8 (eight) hours. 21 capsule Bannie Lobban K, PA-C      PDMP not reviewed this encounter.   Jeani Hawking, PA-C 05/11/21 [phone]

## 2021-08-01 ENCOUNTER — Ambulatory Visit (INDEPENDENT_AMBULATORY_CARE_PROVIDER_SITE_OTHER): Payer: Self-pay

## 2021-08-01 NOTE — Telephone Encounter (Signed)
Patient is calling to report he has chronic cough for 1 week. Patient states the other symptoms have cleared- but he is still experiencing productive cough. Patient has used OTC- Robitussin, Theraflu, and Hall cough drops for his symptoms.Call to office- no appointment is available- advised virtual option/UC  Reason for Disposition  [1] Nasal discharge AND [2] present > 10 days  Answer Assessment - Initial Assessment Questions 1. ONSET: "When did the cough begin?"      1 week 2. SEVERITY: "How bad is the cough today?"      Cough- spells 3. SPUTUM: "Describe the color of your sputum" (none, dry cough; clear, white, yellow, green)     Clear- now, green in am 4. HEMOPTYSIS: "Are you coughing up any blood?" If so ask: "How much?" (flecks, streaks, tablespoons, etc.)     Flecks at times 5. DIFFICULTY BREATHING: "Are you having difficulty breathing?" If Yes, ask: "How bad is it?" (e.g., mild, moderate, severe)    - MILD: No SOB at rest, mild SOB with walking, speaks normally in sentences, can lie down, no retractions, pulse < 100.    - MODERATE: SOB at rest, SOB with minimal exertion and prefers to sit, cannot lie down flat, speaks in phrases, mild retractions, audible wheezing, pulse 100-120.    - SEVERE: Very SOB at rest, speaks in single words, struggling to breathe, sitting hunched forward, retractions, pulse > 120      Normal breathing 6. FEVER: "Do you have a fever?" If Yes, ask: "What is your temperature, how was it measured, and when did it start?"     no 7. CARDIAC HISTORY: "Do you have any history of heart disease?" (e.g., heart attack, congestive heart failure)      no 8. LUNG HISTORY: "Do you have any history of lung disease?"  (e.g., pulmonary embolus, asthma, emphysema)     no 9. PE RISK FACTORS: "Do you have a history of blood clots?" (or: recent major surgery, recent prolonged travel, bedridden)     no 10. OTHER SYMPTOMS: "Do you have any other symptoms?" (e.g., runny nose,  wheezing, chest pain)       No other symptoms 11. PREGNANCY: "Is there any chance you are pregnant?" "When was your last menstrual period?"       na 12. TRAVEL: "Have you traveled out of the country in the last month?" (e.g., travel history, exposures)       na  Protocols used: Cough - Acute Productive-A-AH

## 2021-08-01 NOTE — Telephone Encounter (Signed)
Pt called saying he has had a cough over the weekend. He has been having symptoms for a couple of weeks and has been taking otc medications.   He took an Covid test but it was neg.  He wants to know if Marcelino Duster can prescribe him anything.  He says he is working but needs something for the cough if possible.  He declined an appt.    Called pt phone number and LM on VM to call back. Phone number provided.

## 2021-08-02 ENCOUNTER — Ambulatory Visit (INDEPENDENT_AMBULATORY_CARE_PROVIDER_SITE_OTHER): Payer: Self-pay | Admitting: *Deleted

## 2021-08-02 ENCOUNTER — Other Ambulatory Visit: Payer: Self-pay

## 2021-08-02 NOTE — Telephone Encounter (Signed)
Summary: Cough, seeking appt. none available   Pt has cold/flu symptoms, seeking appt, nothing available soon, covid tested negative,   Best contact: [phone]     Attempted to contact patient- left message- he is currently waiting at [facility] per chart- please continue visit at UC so he can get the quickest possible care today. Will check chart later to see if he kept appointment.

## 2021-08-02 NOTE — Telephone Encounter (Signed)
Second attempt to reach pt. Left VM to call back. 

## 2021-08-02 NOTE — Telephone Encounter (Signed)
Called patient  and left message on machine.  Encounter forwarded to office.

## 2021-08-04 ENCOUNTER — Ambulatory Visit (INDEPENDENT_AMBULATORY_CARE_PROVIDER_SITE_OTHER): Payer: BC Managed Care – PPO

## 2021-08-04 ENCOUNTER — Ambulatory Visit (HOSPITAL_COMMUNITY)
Admission: EM | Admit: 2021-08-04 | Discharge: 2021-08-04 | Disposition: A | Discharge status: Home / Self Care | Payer: BC Managed Care – PPO | Attending: Urgent Care | Admitting: Urgent Care

## 2021-08-04 ENCOUNTER — Encounter (HOSPITAL_COMMUNITY): Payer: Self-pay

## 2021-08-04 ENCOUNTER — Other Ambulatory Visit: Payer: Self-pay

## 2021-08-04 DIAGNOSIS — R051 Acute cough: Secondary | ICD-10-CM | POA: Insufficient documentation

## 2021-08-04 DIAGNOSIS — R0989 Other specified symptoms and signs involving the circulatory and respiratory systems: Secondary | ICD-10-CM | POA: Insufficient documentation

## 2021-08-04 DIAGNOSIS — R059 Cough, unspecified: Secondary | ICD-10-CM | POA: Diagnosis not present

## 2021-08-04 DIAGNOSIS — J069 Acute upper respiratory infection, unspecified: Secondary | ICD-10-CM | POA: Diagnosis not present

## 2021-08-04 LAB — RESPIRATORY PANEL BY PCR

## 2021-08-04 MED ORDER — PROMETHAZINE-DM 6.25-15 MG/5ML PO SYRP: 5.0000 mL | ORAL_SOLUTION | Freq: Every evening | ORAL | 0 refills | Status: DC | PRN

## 2021-08-04 MED ORDER — CETIRIZINE HCL 10 MG PO TABS: 10.0000 mg | ORAL_TABLET | Freq: Every day | ORAL | 0 refills | Status: DC

## 2021-08-04 MED ORDER — BENZONATATE 100 MG PO CAPS: 100.0000 mg | ORAL_CAPSULE | Freq: Three times a day (TID) | ORAL | 0 refills | Status: DC | PRN

## 2021-08-04 NOTE — ED Provider Notes (Signed)
[facility]   MRN: [id number] DOB: [birth year]  Subjective:   Randall Cook is a 65 y.o. male presenting for 2-week history of persistent coughing, slight congestion of his lungs.  Denies history of asthma, COPD.  Patient is not a smoker.  He does have a history of respiratory failure due to COVID-19 however from January 2021.  Denies fever, body aches, chest pain, shortness of breath, wheezing, sinus pain.  He was also positive for COVID-19 05/11/2021.  No current facility-administered medications for this encounter.  Current Outpatient Medications:    atorvastatin (LIPITOR) 20 MG tablet, Take 1 tablet (20 mg total) by mouth daily., Disp: 90 tablet, Rfl: 3   hydrochlorothiazide (HYDRODIURIL) 25 MG tablet, Take 1 tablet daily in the morning, Disp: 90 tablet, Rfl: 1   ibuprofen (ADVIL,MOTRIN) 600 MG tablet, Take 1 tablet (600 mg total) by mouth every 8 (eight) hours as needed. (Patient taking differently: Take 600 mg by mouth every 8 (eight) hours as needed for mild pain or moderate pain.), Disp: 30 tablet, Rfl: 0   losartan (COZAAR) 50 MG tablet, Take 1 tablet (50 mg total) by mouth daily., Disp: 90 tablet, Rfl: 1   methocarbamol (ROBAXIN) 500 MG tablet, Take 1 tablet (500 mg total) by mouth every 8 (eight) hours as needed for muscle spasms., Disp: 90 tablet, Rfl: 1   Allergies  Allergen Reactions   Penicillins Rash    Has patient had a PCN reaction causing immediate rash, facial/tongue/throat swelling, SOB or lightheadedness with hypotension: Unknown Has patient had a PCN reaction causing severe rash involving mucus membranes or skin necrosis: No  Has patient had a PCN reaction that required hospitalization: No  Has patient had a PCN reaction occurring within the last 10 years: Yes  If all of the above answers are "NO", then may proceed with Cephalosporin use.     Past Medical History:  Diagnosis Date   Arthritis    Dysrhythmia    tachycardia- EKG,clearance  02/29/12 Dr Azucena Cecil  on chart   GERD (gastroesophageal reflux disease)    Hyperlipidemia    Hypertension    LOV,   Dr Azucena Cecil 02/29/12 on chart-   CBC with diff, CMET 02/29/12 on chart   Sleep apnea    STOP BANG SCORE 4     Past Surgical History:  Procedure Laterality Date   NO PAST SURGERIES     PARTIAL KNEE ARTHROPLASTY  03/10/2012   Procedure: UNICOMPARTMENTAL KNEE;  Surgeon: [name], MD;  Location: [facility];  Service: Orthopedics;  Laterality: Left;    History reviewed. No pertinent family history.  Social History   Tobacco Use   Smoking status: Former    Packs/day: 0.50    Years: 20.00    Pack years: 10.00    Types: Cigarettes    Quit date: 03/05/1999    Years since quitting: 22.4   Smokeless tobacco: Never  Vaping Use   Vaping Use: Never used  Substance Use Topics   Alcohol use: Yes    Comment: none x 20 yrs   Drug use: No    ROS   Objective:   Vitals: BP (!) 149/91 (BP Location: Left Arm)   Pulse 74   Temp 98.1 F (36.7 C) (Oral)   Resp 18   SpO2 95%   Physical Exam Constitutional:      General: He is not in acute distress.    Appearance: Normal appearance. He is well-developed. He is not ill-appearing, toxic-appearing or  diaphoretic.  HENT:     Head: Normocephalic and atraumatic.     Right Ear: External ear normal.     Left Ear: External ear normal.     Nose: Nose normal.     Mouth/Throat:     Mouth: Mucous membranes are moist.     Pharynx: Oropharynx is clear.  Eyes:     General: No scleral icterus.    Extraocular Movements: Extraocular movements intact.     Pupils: Pupils are equal, round, and reactive to light.  Cardiovascular:     Rate and Rhythm: Normal rate and regular rhythm.     Heart sounds: Normal heart sounds. No murmur heard.   No friction rub. No gallop.  Pulmonary:     Effort: Pulmonary effort is normal. No respiratory distress.     Breath sounds: No stridor. Rales (mid-left side) present. No wheezing or rhonchi.  Neurological:      Mental Status: He is alert and oriented to person, place, and time.  Psychiatric:        Mood and Affect: Mood normal.        Behavior: Behavior normal.        Thought Content: Thought content normal.    DG Chest 2 View  Result Date: 08/04/2021 CLINICAL DATA:  Cough.  Rales. EXAM: CHEST - 2 VIEW COMPARISON:  Chest radiograph 06/14/2020 FINDINGS: The heart is normal in size. There is stable aortic tortuosity. Mild and right suprahilar scarring. No acute airspace disease. Mild hyperinflation and bronchial thickening, chronic. No pleural effusion or pneumothorax. No acute osseous abnormalities. IMPRESSION: 1. No acute abnormality. 2. Stable right upper lung zone scarring. 3. Unchanged aortic tortuosity. Electronically Signed   By: Narda Rutherford M.D.   On: 08/04/2021 19:22     Assessment and Plan :   PDMP not reviewed this encounter.  1. Viral URI with cough   2. Acute cough   3. Chest congestion    Respiratory panel pending. Will manage for viral illness such as viral URI, viral syndrome, viral rhinitis, influenza. Recommended supportive care. Offered scripts for symptomatic relief. Testing is pending. Will defer steroid use for now. Counseled patient on potential for adverse effects with medications prescribed/recommended today, ER and return-to-clinic precautions discussed, patient verbalized understanding.     Wallis Bamberg, PA-C 08/04/21 1946

## 2021-08-04 NOTE — ED Triage Notes (Signed)
Pt c/o cough and congestion x2wks. States taking OTC meds with little relief.

## 2021-11-08 ENCOUNTER — Encounter (HOSPITAL_COMMUNITY): Payer: Self-pay | Admitting: Emergency Medicine

## 2021-11-08 ENCOUNTER — Telehealth (INDEPENDENT_AMBULATORY_CARE_PROVIDER_SITE_OTHER): Payer: Self-pay | Admitting: *Deleted

## 2021-11-08 ENCOUNTER — Other Ambulatory Visit: Payer: Self-pay

## 2021-11-08 ENCOUNTER — Ambulatory Visit (HOSPITAL_COMMUNITY)
Admission: EM | Admit: 2021-11-08 | Discharge: 2021-11-08 | Disposition: A | Discharge status: Home / Self Care | Payer: BC Managed Care – PPO | Attending: Urgent Care | Admitting: Urgent Care

## 2021-11-08 DIAGNOSIS — I10 Essential (primary) hypertension: Secondary | ICD-10-CM | POA: Diagnosis not present

## 2021-11-08 DIAGNOSIS — K029 Dental caries, unspecified: Secondary | ICD-10-CM | POA: Diagnosis not present

## 2021-11-08 DIAGNOSIS — K0889 Other specified disorders of teeth and supporting structures: Secondary | ICD-10-CM | POA: Diagnosis not present

## 2021-11-08 MED ORDER — HYDROCODONE-ACETAMINOPHEN 5-325 MG PO TABS: 1.0000 | ORAL_TABLET | Freq: Four times a day (QID) | ORAL | 0 refills | Status: AC | PRN

## 2021-11-08 MED ORDER — CLINDAMYCIN HCL 150 MG PO CAPS: 150.0000 mg | ORAL_CAPSULE | Freq: Four times a day (QID) | ORAL | 0 refills | Status: AC

## 2021-11-08 MED ORDER — LIDOCAINE VISCOUS HCL 2 % MT SOLN: OROMUCOSAL | 0 refills | Status: DC

## 2021-11-08 NOTE — ED Triage Notes (Signed)
Pt reports upper dental pain that started this morning. States has been using orajel with no relief.

## 2021-11-08 NOTE — Telephone Encounter (Signed)
Copied from CRM [id number]. Topic: General - Other >> Nov 08, 2021  3:03 PM Jaquita Rector A wrote: Reason for CRM: Patient called in to inform Ms Randa Evens that he have a very bad toothache and need to know what to do asking for some type of pain medication but states he has no money since he don't get paid until next week. Would like a call back today please at Ph# [phone]

## 2021-11-08 NOTE — ED Provider Notes (Signed)
[place]    CSN: [id number] Arrival date & time: 11/08/21  1724      History   Chief Complaint Chief Complaint  Patient presents with   Dental Pain    HPI Randall Cook is a 66 y.o. male.   Pleasant 66 year old male presents today concerns regarding tooth pain to his left top back molar. Has hx of severe dental infections and majority of his teeth have already been fully extracted. Pt states this left upper molar has been causing him "some trouble" for a while, but woke up in excruciating pain today.  Has been using over-the-counter pain medication and Orajel without relief.  Denies any fever or abscess drainage.  Denies any facial swelling.  Does have some cervical lymphadenopathy.  Patient admits to history of hypertension, states he took his medication this morning, but feels that is elevated due to his pain.   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 Authorizing Provider  clindamycin (CLEOCIN) 150 MG capsule Take 1 capsule (150 mg total) by mouth every 6 (six) hours for 7 days. 11/08/21  11/15/21 Yes Halvor Behrend L, PA  HYDROcodone-acetaminophen (NORCO/VICODIN) 5-325 MG tablet Take 1 tablet by mouth every 6 (six) hours as needed for up to 5 days for severe pain. 11/08/21 11/13/21 Yes Jolean Madariaga L, PA  lidocaine (XYLOCAINE) 2 % solution Apply a small amount to affected area of gums/ tooth every 4-6 hours as needed x 5 days 11/08/21  Yes Idy Rawling L, PA  atorvastatin (LIPITOR) 20 MG tablet Take 1 tablet (20 mg total) by mouth daily. 01/11/21   Kerin Perna, NP  benzonatate (TESSALON) 100 MG capsule Take 1-2 capsules (100-200 mg total) by mouth 3 (three) times daily as needed for cough. 08/04/21   Jaynee Eagles, PA-C  cetirizine (ZYRTEC ALLERGY) 10 MG tablet Take 1 tablet (10 mg total) by mouth daily. 08/04/21   Jaynee Eagles, PA-C  hydrochlorothiazide (HYDRODIURIL) 25 MG tablet Take 1 tablet daily in the morning 01/11/21   Kerin Perna, NP  ibuprofen (ADVIL,MOTRIN) 600 MG tablet Take 1 tablet (600 mg total) by mouth every 8 (eight) hours as needed. Patient taking differently: Take 600 mg by mouth every 8 (eight) hours as needed for mild pain or moderate pain. 11/24/18   Kerin Perna, NP  losartan (COZAAR) 50 MG tablet Take 1 tablet (50 mg total) by mouth daily. 01/11/21   Kerin Perna, NP  methocarbamol (ROBAXIN) 500 MG  tablet Take 1 tablet (500 mg total) by mouth every 8 (eight) hours as needed for muscle spasms. 12/05/20   Kerin Perna, NP  promethazine-dextromethorphan (PROMETHAZINE-DM) 6.25-15 MG/5ML syrup Take 5 mLs by mouth at bedtime as needed for cough. 08/04/21   Jaynee Eagles, PA-C    Family History History reviewed. No pertinent family history.  Social History Social History   Tobacco Use   Smoking status: Former    Packs/day: 0.50    Years: 20.00    Pack years: 10.00    Types: Cigarettes    Quit date: 03/05/1999    Years since quitting: 22.6   Smokeless tobacco: Never  Vaping Use   Vaping Use: Never used  Substance Use Topics    Alcohol use: Yes    Comment: none x 20 yrs   Drug use: No     Allergies   Penicillins   Review of Systems Review of Systems  HENT:  Positive for dental problem.   All other systems reviewed and are negative.   Physical Exam Triage Vital Signs ED Triage Vitals  Enc Vitals Group     BP 11/08/21 1750 (!) 168/119     Pulse Rate 11/08/21 1750 95     Resp 11/08/21 1750 18     Temp 11/08/21 1750 98.5 F (36.9 C)     Temp Source 11/08/21 1750 Oral     SpO2 11/08/21 1750 94 %     Weight 11/08/21 1749 261 lb 14.5 oz (118.8 kg)     Height 11/08/21 1749 6\' 1"  (1.854 m)     Head Circumference --      Peak Flow --      Pain Score 11/08/21 1748 9     Pain Loc --      Pain Edu? --      Excl. in [place]? --    No data found.  Updated Vital Signs BP (!) 168/119 (BP Location: Right Arm)    Pulse 95    Temp 98.5 F (36.9 C) (Oral)    Resp 18    Ht 6\' 1"  (1.854 m)    Wt 261 lb 14.5 oz (118.8 kg)    SpO2 94%    BMI 34.55 kg/m   Visual Acuity Right Eye Distance:   Left Eye Distance:   Bilateral Distance:    Right Eye Near:   Left Eye Near:    Bilateral Near:     Physical Exam Vitals and nursing note reviewed.  Constitutional:      General: He is not in acute distress.    Appearance: Normal appearance. He is well-developed. He is obese. He is not ill-appearing, toxic-appearing or diaphoretic.  HENT:     Head: Normocephalic and atraumatic.     Right Ear: External ear normal.     Left Ear: External ear normal.     Nose: Nose normal. No congestion or rhinorrhea.     Mouth/Throat:     Mouth: Mucous membranes are moist.     Pharynx: Oropharynx is clear. No oropharyngeal exudate or posterior oropharyngeal erythema.     Comments: Partially edentulous Remaining teeth are severely decaying Tooth of concern appears fractured with loss of the posterior aspect of the crown. Pain with percussion of tooth. No appreciable abscess formation. No facial swelling, but developing submandibular  lymphadenopathy Eyes:     Extraocular Movements: Extraocular movements intact.     Conjunctiva/sclera: Conjunctivae normal.     Pupils: Pupils are equal, round, and reactive to light.  Cardiovascular:     Rate and Rhythm: Normal rate and regular rhythm.     Heart sounds: No murmur heard. Pulmonary:     Effort: Pulmonary effort is normal. No respiratory distress.     Breath sounds: Normal breath sounds.  Abdominal:     Palpations: Abdomen is soft.     Tenderness: There is no abdominal tenderness.  Musculoskeletal:        General: No swelling.     Cervical back: Normal range of motion and neck supple. No rigidity or tenderness.  Lymphadenopathy:     Cervical: No cervical adenopathy.  Skin:    General: Skin is warm and dry.     Capillary Refill: Capillary refill takes less than 2 seconds.     Findings: No bruising, erythema or rash.  Neurological:     General: No focal deficit present.     Mental Status: He is alert and oriented to person, place, and time.  Psychiatric:        Mood and Affect: Mood normal.     UC Treatments / Results  Labs (all labs ordered are listed, but only abnormal results are displayed) Labs Reviewed - No data to display  EKG   Radiology No results found.  Procedures Procedures (including critical care time)  Medications Ordered in UC Medications - No data to display  Initial Impression / Assessment and Plan / UC Course  I have reviewed the triage vital signs and the nursing notes.  Pertinent labs & imaging results that were available during my care of the patient were reviewed by me and considered in my medical decision making (see chart for details).     Dental pain - pt needs complete extraction of numerous teeth. Will start clindamycin given pt's PCN allergy. Take yogurt to prevent diarrhea with it. Pain medication as needed, low cost /free dental services list provided to patient, encouraged pt to call tomorrow to have extraction within  the next week. Essential hypertension - encouraged pt to monitor at home as elevated BP could cause delays in getting tooth extracted. Likely secondary to pain today. F/U with pcp for med adjustment if needed to ensure timely extraction.  Final Clinical Impressions(s) / UC Diagnoses   Final diagnoses:  Pain, dental  Dental caries  Essential hypertension     Discharge Instructions      Your pain is likely due to exposed root and dental infections. Please start the antibiotics as prescribed. Take the pain medication only for pain >7/10. This medication may make you drowsy or cause constipation, so use sparingly. You may also take OTC ibuprofen 800mg  every 8 hours alternating with tylenol 1000mg  every 8 hours. Please follow up for dental extraction in the next one week, a list of resources has been provided for you. Please check your blood pressure in the morning, goal <140/90 and follow up with PCP if it remains elevated.     ED Prescriptions     Medication Sig Dispense Auth. Provider   clindamycin (CLEOCIN) 150 MG capsule Take 1 capsule (150 mg total) by mouth every 6 (six) hours for 7 days. 28 capsule Cory Rama L, PA   HYDROcodone-acetaminophen (NORCO/VICODIN) 5-325 MG tablet Take 1 tablet by mouth every 6 (six) hours as needed for up to 5 days for severe pain. 10 tablet Marvia Troost L, PA   lidocaine (XYLOCAINE) 2 % solution Apply a small amount to affected area of gums/ tooth every 4-6 hours as needed x 5 days 20 mL  Aadon Gorelik L, PA      I have reviewed the PDMP during this encounter.   Chaney Malling, [place] 11/08/21 1845

## 2021-11-08 NOTE — Telephone Encounter (Signed)
Patient advised to reach out to a dentist to schedule an appointment. He has [employer]. Set up appt with Provider Nils Pyle, NP on Friday 11/10/21 at 0800

## 2021-11-08 NOTE — Discharge Instructions (Addendum)
Your pain is likely due to exposed root and dental infections. Please start the antibiotics as prescribed. Take the pain medication only for pain >7/10. This medication may make you drowsy or cause constipation, so use sparingly. You may also take OTC ibuprofen 800mg  every 8 hours alternating with tylenol 1000mg  every 8 hours. Please follow up for dental extraction in the next one week, a list of resources has been provided for you. Please check your blood pressure in the morning, goal <140/90 and follow up with PCP if it remains elevated.

## 2021-11-10 ENCOUNTER — Telehealth: Payer: BC Managed Care – PPO | Admitting: Nurse Practitioner

## 2021-11-10 ENCOUNTER — Other Ambulatory Visit: Payer: Self-pay

## 2022-01-20 ENCOUNTER — Encounter (HOSPITAL_COMMUNITY): Payer: Self-pay | Admitting: Emergency Medicine

## 2022-01-20 ENCOUNTER — Ambulatory Visit (HOSPITAL_COMMUNITY)
Admission: EM | Admit: 2022-01-20 | Discharge: 2022-01-20 | Disposition: A | Discharge status: Home / Self Care | Payer: PRIVATE HEALTH INSURANCE | Attending: Emergency Medicine | Admitting: Emergency Medicine

## 2022-01-20 DIAGNOSIS — Z760 Encounter for issue of repeat prescription: Secondary | ICD-10-CM

## 2022-01-20 DIAGNOSIS — I83893 Varicose veins of bilateral lower extremities with other complications: Secondary | ICD-10-CM | POA: Diagnosis not present

## 2022-01-20 DIAGNOSIS — E785 Hyperlipidemia, unspecified: Secondary | ICD-10-CM | POA: Diagnosis not present

## 2022-01-20 DIAGNOSIS — R600 Localized edema: Secondary | ICD-10-CM

## 2022-01-20 DIAGNOSIS — E782 Mixed hyperlipidemia: Secondary | ICD-10-CM

## 2022-01-20 DIAGNOSIS — I10 Essential (primary) hypertension: Secondary | ICD-10-CM | POA: Diagnosis not present

## 2022-01-20 MED ORDER — LOSARTAN POTASSIUM 50 MG PO TABS: 50.0000 mg | ORAL_TABLET | Freq: Every day | ORAL | 1 refills | Status: DC

## 2022-01-20 MED ORDER — ATORVASTATIN CALCIUM 20 MG PO TABS: 20.0000 mg | ORAL_TABLET | Freq: Every day | ORAL | 1 refills | Status: DC

## 2022-01-20 MED ORDER — HYDROCHLOROTHIAZIDE 25 MG PO TABS: ORAL_TABLET | ORAL | 1 refills | Status: DC

## 2022-01-20 MED ORDER — DICLOFENAC SODIUM 1 % EX GEL: 4.0000 g | Freq: Four times a day (QID) | CUTANEOUS | 2 refills | Status: DC

## 2022-01-20 NOTE — ED Triage Notes (Signed)
Pt reports sharp left lower leg pain x 3 weeks. Denies any obvious injuries.  ?Also reports rash on left lower leg.  ?

## 2022-01-20 NOTE — Discharge Instructions (Addendum)
Dear Randall Cook, ? ?I have renewed your prescriptions for hydrochlorothiazide which is a fluid pill that you used to take, it was prescribed by your regular provider a year ago.  Please take 1 tablet every morning.  I also did the liberty of renewing your other blood pressure medication, losartan and your cholesterol medication atorvastatin.  Please take the losartan in the morning with your hydrochlorothiazide and take your atorvastatin in the evening before bedtime. ? ?I have also sent a prescription for a topical anti-inflammatory gel that you can apply to the area of your lower leg that has been bothering you.  You can use this 4 times daily.  Please also consider taking an aspirin every day for prevention of blood clots. ? ?Thank you for visiting urgent care today.  Please make an appointment to see your primary care provider as soon as possible, it is definitely time for another checkup. ?

## 2022-01-20 NOTE — ED Provider Notes (Signed)
?[facility] ? ? ? ?CSN: [id number] ?Arrival date & time: 01/20/22  1032 ?  ? ?HISTORY  ? ?Chief Complaint  ?Patient presents with  ? Leg Pain  ? Rash  ? ?HPI ?Randall Cook is a 66 y.o. male. Patient complains of sharp left lower pain for the past 3 weeks.  Patient states he is not aware of any recent injuries.  Patient states he also has a rash on his left lower leg just noticed it a few days ago but is no longer has been there.  Blood pressure is significantly elevated today, patient states he is not currently taking any of his blood pressure medications. ? ?The history is provided by the patient.  ?Past Medical History:  ?Diagnosis Date  ? Arthritis   ? Dysrhythmia   ? tachycardia- EKG,clearance 02/29/12 Dr Azucena Cecil  on chart  ? GERD (gastroesophageal reflux disease)   ? Hyperlipidemia   ? Hypertension   ? LOV,   Dr Azucena Cecil 02/29/12 on chart-   CBC with diff, CMET 02/29/12 on chart  ? Sleep apnea   ? STOP BANG SCORE 4  ? ?Patient Active Problem List  ? Diagnosis Date Noted  ? Obesity (BMI 30.0-34.9) 10/02/2019  ? Acute hypoxemic respiratory failure due to COVID-19 [facility]) 09/27/2019  ? Hypertension 01/07/2018  ? Hyperlipidemia 04/09/2017  ? 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 ?Past Surgical History:  ?Procedure Laterality Date  ? NO PAST SURGERIES    ? PARTIAL KNEE ARTHROPLASTY  03/10/2012  ? Procedure: UNICOMPARTMENTAL KNEE;  Surgeon: [name], MD;  Location: [facility];  Service: Orthopedics;  Laterality: Left;  ? ? ?Home Medications   ? ?Prior to Admission medications   ?Medication Sig Start Date End Date Taking? Authorizing Provider  ?diclofenac Sodium (VOLTAREN) 1 % GEL Apply 4 g topically 4 (four) times daily. Apply to affected areas 4 times daily as needed for pain. 01/20/22  Yes Theadora Rama Scales, PA-C  ?atorvastatin (LIPITOR) 20 MG tablet Take 1 tablet (20 mg total) by mouth daily. 01/20/22   Theadora Rama Scales, PA-C  ?hydrochlorothiazide (HYDRODIURIL) 25 MG tablet Take 1 tablet daily in the morning  01/20/22   Theadora Rama Scales, PA-C  ?losartan (COZAAR) 50 MG tablet Take 1 tablet (50 mg total) by mouth daily. 01/20/22   Theadora Rama Scales, PA-C  ? ? ?Family History ?History reviewed. No pertinent family history. ?Social History ?Social History  ? ?Tobacco Use  ? Smoking status: Former  ?  Packs/day: 0.50  ?  Years: 20.00  ?  Pack years: 10.00  ?  Types: Cigarettes  ?  Quit date: 03/05/1999  ?  Years since quitting: 22.8  ? Smokeless tobacco: Never  ?Vaping Use  ? Vaping Use: Never used  ?Substance Use Topics  ? Alcohol use: Yes  ?  Comment: none x 20 yrs  ? Drug use: No  ? ?Allergies   ?Penicillins ? ?Review of Systems ?Review of Systems ?Pertinent findings noted in history of present illness.  ? ?Physical Exam ?Triage Vital Signs ?ED Triage Vitals  ?Enc Vitals Group  ?   BP 07/21/21 0827 (!) 147/82  ?   Pulse Rate 07/21/21 0827 72  ?   Resp 07/21/21 0827 18  ?   Temp 07/21/21 0827 98.3 ?F (36.8 ?C)  ?   Temp Source 07/21/21 0827 Oral  ?   SpO2 07/21/21 0827 98 %  ?   Weight --   ?   Height --   ?  Head Circumference --   ?   Peak Flow --   ?   Pain Score 07/21/21 0826 5  ?   Pain Loc --   ?   Pain Edu? --   ?   Excl. in [place]? --   ?No data found. ? ?Updated Vital Signs ?BP (!) 152/106 (BP Location: Right Arm)   Pulse 94   Temp 98.3 ?F (36.8 ?C) (Oral)   Resp 17   Ht 6\' 1"  (1.854 m)   Wt 261 lb 14.5 oz (118.8 kg)   SpO2 95%   BMI 34.55 kg/m?  ? ?Physical Exam ?Vitals and nursing note reviewed.  ?Constitutional:   ?   General: He is not in acute distress. ?   Appearance: Normal appearance. He is not ill-appearing.  ?HENT:  ?   Head: Normocephalic and atraumatic.  ?Eyes:  ?   General: Lids are normal.     ?   Right eye: No discharge.     ?   Left eye: No discharge.  ?   Extraocular Movements: Extraocular movements intact.  ?   Conjunctiva/sclera: Conjunctivae normal.  ?   Right eye: Right conjunctiva is not injected.  ?   Left eye: Left conjunctiva is not injected.  ?Neck:  ?   Trachea: Trachea and  phonation normal.  ?Cardiovascular:  ?   Rate and Rhythm: Normal rate and regular rhythm.  ?   Pulses: Normal pulses.  ?   Heart sounds: Normal heart sounds. No murmur heard. ?  No friction rub. No gallop.  ?   Comments: Rash on left lower extremity secondary to venous stasis. ?Pulmonary:  ?   Effort: Pulmonary effort is normal. No accessory muscle usage, prolonged expiration or respiratory distress.  ?   Breath sounds: Normal breath sounds. No stridor, decreased air movement or transmitted upper airway sounds. No decreased breath sounds, wheezing, rhonchi or rales.  ?Chest:  ?   Chest wall: No tenderness.  ?Musculoskeletal:     ?   General: Normal range of motion.  ?   Cervical back: Normal range of motion and neck supple. Normal range of motion.  ?   Right lower leg: 3+ Pitting Edema present.  ?   Left lower leg: 3+ Pitting Edema present.  ?Lymphadenopathy:  ?   Cervical: No cervical adenopathy.  ?Skin: ?   General: Skin is warm and dry.  ?   Findings: No erythema or rash.  ?Neurological:  ?   General: No focal deficit present.  ?   Mental Status: He is alert and oriented to person, place, and time.  ?Psychiatric:     ?   Mood and Affect: Mood normal.     ?   Behavior: Behavior normal.  ? ? ?Visual Acuity ?Right Eye Distance:   ?Left Eye Distance:   ?Bilateral Distance:   ? ?Right Eye Near:   ?Left Eye Near:    ?Bilateral Near:    ? ?UC Couse / Diagnostics / Procedures:  ?  ?EKG ? ?Radiology ?No results found. ? ?Procedures ?Procedures (including critical care time) ? ?UC Diagnoses / Final Clinical Impressions(s)   ?I have reviewed the triage vital signs and the nursing notes. ? ?Pertinent labs & imaging results that were available during my care of the patient were reviewed by me and considered in my medical decision making (see chart for details).   ? ?Final diagnoses:  ?Bilateral lower extremity edema  ?Varicose veins of bilateral lower extremities with other complications  ? ?  Patient advised to resume his  blood pressure medications.  Patient provided with a prescription of Voltaren for pain. ? ?ED Prescriptions   ? ? Medication Sig Dispense Auth. Provider  ? losartan (COZAAR) 50 MG tablet Take 1 tablet (50 mg total) by mouth daily. 90 tablet Theadora Rama Scales, PA-C  ? hydrochlorothiazide (HYDRODIURIL) 25 MG tablet Take 1 tablet daily in the morning 90 tablet Theadora Rama Scales, PA-C  ? atorvastatin (LIPITOR) 20 MG tablet Take 1 tablet (20 mg total) by mouth daily. 90 tablet Theadora Rama Scales, PA-C  ? diclofenac Sodium (VOLTAREN) 1 % GEL Apply 4 g topically 4 (four) times daily. Apply to affected areas 4 times daily as needed for pain. 100 g Theadora Rama Scales, PA-C  ? ?  ? ?PDMP not reviewed this encounter. ? ?Pending results:  ?Labs Reviewed - No data to display ? ?Medications Ordered in UC: ?Medications - No data to display ? ?Disposition Upon Discharge:  ?Condition: stable for discharge home ?Home: take medications as prescribed; routine discharge instructions as discussed; follow up as advised. ? ?Patient presented with an acute illness with associated systemic symptoms and significant discomfort requiring urgent management. In my opinion, this is a condition that a prudent lay person (someone who possesses an average knowledge of health and medicine) may potentially expect to result in complications if not addressed urgently such as respiratory distress, impairment of bodily function or dysfunction of bodily organs.  ? ?Routine symptom specific, illness specific and/or disease specific instructions were discussed with the patient and/or caregiver at length.  ? ?As such, the patient has been evaluated and assessed, work-up was performed and treatment was provided in alignment with urgent care protocols and evidence based medicine.  Patient/parent/caregiver has been advised that the patient may require follow up for further testing and treatment if the symptoms continue in spite of treatment, as  clinically indicated and appropriate. ? ?Patient/parent/caregiver has been advised to return to the [facility] or PCP if no better; to PCP or the Emergency Department if new signs and symptoms develop, or if the current si

## 2023-07-08 ENCOUNTER — Ambulatory Visit (INDEPENDENT_AMBULATORY_CARE_PROVIDER_SITE_OTHER): Payer: PRIVATE HEALTH INSURANCE | Admitting: Primary Care

## 2023-07-18 ENCOUNTER — Ambulatory Visit (INDEPENDENT_AMBULATORY_CARE_PROVIDER_SITE_OTHER): Payer: PRIVATE HEALTH INSURANCE | Admitting: Primary Care

## 2023-07-25 ENCOUNTER — Encounter (INDEPENDENT_AMBULATORY_CARE_PROVIDER_SITE_OTHER): Payer: Self-pay | Admitting: Primary Care

## 2023-07-25 ENCOUNTER — Ambulatory Visit (INDEPENDENT_AMBULATORY_CARE_PROVIDER_SITE_OTHER): Payer: Medicare Other | Admitting: Primary Care

## 2023-07-25 DIAGNOSIS — I10 Essential (primary) hypertension: Secondary | ICD-10-CM | POA: Diagnosis not present

## 2023-07-25 DIAGNOSIS — Z760 Encounter for issue of repeat prescription: Secondary | ICD-10-CM | POA: Diagnosis not present

## 2023-07-25 DIAGNOSIS — E782 Mixed hyperlipidemia: Secondary | ICD-10-CM

## 2023-07-25 MED ORDER — HYDROCHLOROTHIAZIDE 25 MG PO TABS: ORAL_TABLET | ORAL | 1 refills | Status: DC

## 2023-07-25 MED ORDER — LOSARTAN POTASSIUM 50 MG PO TABS: 50.0000 mg | ORAL_TABLET | Freq: Every day | ORAL | 1 refills | Status: DC

## 2023-07-25 MED ORDER — ATORVASTATIN CALCIUM 20 MG PO TABS: 20.0000 mg | ORAL_TABLET | Freq: Every day | ORAL | 1 refills | Status: DC

## 2023-07-25 MED ORDER — DICLOFENAC SODIUM 1 % EX GEL: 4.0000 g | Freq: Four times a day (QID) | CUTANEOUS | 2 refills | Status: AC

## 2023-08-03 ENCOUNTER — Encounter (HOSPITAL_COMMUNITY): Payer: Self-pay

## 2023-08-03 ENCOUNTER — Ambulatory Visit (HOSPITAL_COMMUNITY)
Admission: EM | Admit: 2023-08-03 | Discharge: 2023-08-03 | Disposition: A | Discharge status: Home / Self Care | Payer: Medicare Other | Attending: Emergency Medicine | Admitting: Emergency Medicine

## 2023-08-03 DIAGNOSIS — Z20822 Contact with and (suspected) exposure to covid-19: Secondary | ICD-10-CM

## 2023-08-03 LAB — POC COVID19/FLU A&B COMBO
Covid Antigen, POC: NEGATIVE
Influenza A Antigen, POC: NEGATIVE
Influenza B Antigen, POC: NEGATIVE

## 2023-08-03 NOTE — ED Triage Notes (Signed)
Patient here today to be tested for Covid. Patient states that he had cold symptoms last week and his wife tested positive for Covid today. He is no longer having any symptoms.

## 2023-08-03 NOTE — Discharge Instructions (Addendum)
Your COVID and flu testing were negative in clinic.  If you continue to have symptoms please follow-up with your primary care provider.

## 2023-08-03 NOTE — ED Provider Notes (Signed)
[facility]    CSN: [id number] Arrival date & time: 08/03/23  1734      History   Chief Complaint Chief Complaint  Patient presents with   Covid Test    HPI Randall Cook is a 67 y.o. male.   Patient presents to clinic requesting COVID testing.  He did have cold symptoms last week that have since improved.  His wife did test positive for COVID.  He denies any symptoms.  No cough, sore throat, congestion, fevers or fatigue.  The history is provided by the patient and medical records.    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 Authorizing Provider  atorvastatin (LIPITOR) 20 MG tablet Take 1 tablet (20 mg total) by mouth daily. 07/25/23   Grayce Sessions, NP  diclofenac Sodium (VOLTAREN) 1 % GEL Apply 4 g topically 4 (four) times daily. Apply to affected areas 4 times daily as needed for pain. 07/25/23   Grayce Sessions, NP  hydrochlorothiazide (HYDRODIURIL) 25 MG tablet Take 1 tablet daily in the morning 07/25/23   Grayce Sessions, NP  losartan (COZAAR) 50 MG tablet Take 1 tablet (50 mg total)  by mouth daily. 07/25/23   Grayce Sessions, NP    Family History History reviewed. No pertinent family history.  Social History Social History   Tobacco Use   Smoking status: Former    Current packs/day: 0.00    Average packs/day: 0.5 packs/day for 20.0 years (10.0 ttl pk-yrs)    Types: Cigarettes    Start date: 03/05/1979    Quit date: 03/05/1999    Years since quitting: 24.4   Smokeless tobacco: Never  Vaping Use   Vaping status: Never Used  Substance Use Topics   Alcohol use: Not Currently    Comment: none x 20 yrs   Drug use: No     Allergies   Penicillins   Review of Systems Review of Systems  Per HPI   Physical Exam Triage Vital Signs ED Triage Vitals [08/03/23 1815]  Encounter Vitals Group     BP (!) 174/69     Systolic BP Percentile      Diastolic BP Percentile      Pulse Rate 65     Resp 16     Temp 97.8 F (36.6 C)     Temp Source Oral     SpO2 94 %     Weight 250 lb (113.4 kg)     Height 6\' 1"  (  1.854 m)     Head Circumference      Peak Flow      Pain Score 0     Pain Loc      Pain Education      Exclude from Growth Chart    No data found.  Updated Vital Signs BP (!) 174/69 (BP Location: Left Arm)   Pulse 65   Temp 97.8 F (36.6 C) (Oral)   Resp 16   Ht 6\' 1"  (1.854 m)   Wt 250 lb (113.4 kg)   SpO2 94%   BMI 32.98 kg/m   Visual Acuity Right Eye Distance:   Left Eye Distance:   Bilateral Distance:    Right Eye Near:   Left Eye Near:    Bilateral Near:     Physical Exam Vitals and nursing note reviewed.  Constitutional:      Appearance: Normal appearance.  HENT:     Head: Normocephalic and atraumatic.     Right Ear: External ear normal.     Left Ear: External ear normal.     Nose: Nose normal.     Mouth/Throat:     Mouth: Mucous membranes are moist.  Eyes:     Conjunctiva/sclera: Conjunctivae normal.  Cardiovascular:     Rate and Rhythm: Normal rate.  Pulmonary:     Effort: Pulmonary effort is normal. No  respiratory distress.  Skin:    General: Skin is warm and dry.  Neurological:     General: No focal deficit present.     Mental Status: He is alert.  Psychiatric:        Mood and Affect: Mood normal.        Behavior: Behavior is cooperative.      UC Treatments / Results  Labs (all labs ordered are listed, but only abnormal results are displayed) Labs Reviewed  POC COVID19/FLU A&B COMBO    EKG   Radiology No results found.  Procedures Procedures (including critical care time)  Medications Ordered in UC Medications - No data to display  Initial Impression / Assessment and Plan / UC Course  I have reviewed the triage vital signs and the nursing notes.  Pertinent labs & imaging results that were available during my care of the patient were reviewed by me and considered in my medical decision making (see chart for details).  Vitals and triage reviewed, patient is hemodynamically stable.  Wife tested positive for COVID-19 earlier in clinic, patient presents to clinic for testing.  He was sick last week, all symptoms have resolved.  POC rapid COVID negative, flu testing also negative.  Patient notified of results.  Plan of care, follow-up care return precautions given, no questions at this time.     Final Clinical Impressions(s) / UC Diagnoses   Final diagnoses:  Exposure to COVID-19 virus     Discharge Instructions      Your COVID and flu testing were negative in clinic.  If you continue to have symptoms please follow-up with your primary care provider.     ED Prescriptions   None    PDMP not reviewed this encounter.   Maximos Zayas, [place], [place] 08/03/23 [phone]

## 2023-08-15 ENCOUNTER — Ambulatory Visit (INDEPENDENT_AMBULATORY_CARE_PROVIDER_SITE_OTHER): Payer: PRIVATE HEALTH INSURANCE

## 2023-08-16 DIAGNOSIS — K080 Exfoliation of teeth due to systemic causes: Secondary | ICD-10-CM | POA: Diagnosis not present

## 2023-08-19 ENCOUNTER — Ambulatory Visit (INDEPENDENT_AMBULATORY_CARE_PROVIDER_SITE_OTHER): Payer: PRIVATE HEALTH INSURANCE

## 2023-09-16 DIAGNOSIS — K080 Exfoliation of teeth due to systemic causes: Secondary | ICD-10-CM | POA: Diagnosis not present

## 2023-09-26 ENCOUNTER — Ambulatory Visit (HOSPITAL_COMMUNITY)
Admission: EM | Admit: 2023-09-26 | Discharge: 2023-09-26 | Disposition: A | Discharge status: Home / Self Care | Payer: Medicare Other | Attending: Emergency Medicine | Admitting: Emergency Medicine

## 2023-09-26 ENCOUNTER — Encounter (HOSPITAL_COMMUNITY): Payer: Self-pay

## 2023-09-26 DIAGNOSIS — M545 Low back pain, unspecified: Secondary | ICD-10-CM

## 2023-09-26 MED ORDER — BACLOFEN 5 MG PO TABS: 5.0000 mg | ORAL_TABLET | Freq: Two times a day (BID) | ORAL | 0 refills | Status: DC | PRN

## 2023-09-26 MED ORDER — METHYLPREDNISOLONE SODIUM SUCC 125 MG IJ SOLR
INTRAMUSCULAR | Status: AC
  Filled 2023-09-26: qty 2

## 2023-09-26 MED ORDER — METHYLPREDNISOLONE SODIUM SUCC 125 MG IJ SOLR
60.0000 mg | Freq: Once | INTRAMUSCULAR | Status: AC
  Administered 2023-09-26: 60 mg via INTRAMUSCULAR

## 2023-09-26 NOTE — ED Provider Notes (Signed)
 [facility]    CSN: [id number] Arrival date & time: 09/26/23  1057     History   Chief Complaint Chief Complaint  Patient presents with   Back Pain    HPI Randall Cook is a 68 y.o. male.  1 week of right low back pain. Does not radiate. Rating 9/10 pain This morning leg started to feel numb after sitting for a while He stood and the numbness resolved. No weakness. Denies injury or trama. No falls. No history of this. Might have started after doing yard work. Normal bladder/bowel function Took ibuprofen  this morning, wearing a back brace  Past Medical History:  Diagnosis Date   Arthritis    Dysrhythmia    tachycardia- EKG,clearance 02/29/12 Dr Seabron  on chart   GERD (gastroesophageal reflux disease)    Hyperlipidemia    Hypertension    LOV,   Dr Seabron 02/29/12 on chart-   CBC with diff, CMET 02/29/12 on chart   Sleep apnea    STOP BANG SCORE 4    Patient Active Problem List   Diagnosis Date Noted   Obesity (BMI 30.0-34.9) 10/02/2019   Acute hypoxemic respiratory failure due to COVID-19 [facility] 09/27/2019   Hypertension 01/07/2018   Hyperlipidemia 04/09/2017   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 Authorizing Provider  Baclofen  5 MG TABS Take 1 tablet (5 mg total) by mouth 2 (two) times daily as needed. 09/26/23  Yes Lamone Ferrelli, [place], PA-C  atorvastatin  (LIPITOR) 20 MG tablet Take 1 tablet (20 mg total) by mouth daily. 07/25/23   Celestia Rosaline SQUIBB, NP  diclofenac  Sodium (VOLTAREN ) 1 % GEL Apply 4 g topically 4 (four) times daily. Apply to affected areas 4 times daily as needed for pain. 07/25/23   Celestia Rosaline SQUIBB, NP   hydrochlorothiazide  (HYDRODIURIL ) 25 MG tablet Take 1 tablet daily in the morning 07/25/23   Celestia Rosaline SQUIBB, NP  losartan  (COZAAR ) 50 MG tablet Take 1 tablet (50 mg total) by mouth daily. 07/25/23   Celestia Rosaline SQUIBB, NP    Family History History reviewed. No pertinent family history.  Social History Social History   Tobacco Use   Smoking status: Former    Current packs/day: 0.00    Average packs/day: 0.5 packs/day for 20.0 years (10.0 ttl pk-yrs)    Types: Cigarettes    Start date: 03/05/1979    Quit date: 03/05/1999    Years since quitting: 24.5   Smokeless tobacco: Never  Vaping Use   Vaping status: Never Used  Substance Use Topics   Alcohol use: Not Currently    Comment: none x 20 yrs   Drug use: No     Allergies   Penicillins   Review of Systems Review of Systems  Musculoskeletal:  Positive for back pain.   Per HPI  Physical Exam Triage Vital Signs ED Triage Vitals  Encounter Vitals Group     BP 09/26/23 1225 (!) 170/94     Systolic BP Percentile --      Diastolic BP Percentile --      Pulse Rate 09/26/23 1225 100     Resp 09/26/23  1225 20     Temp 09/26/23 1225 98 F (36.7 C)     Temp Source 09/26/23 1225 Oral     SpO2 09/26/23 1225 91 %     Weight 09/26/23 1225 252 lb (114.3 kg)     Height 09/26/23 1225 6' 1 (1.854 m)     Head Circumference --      Peak Flow --      Pain Score 09/26/23 1224 9     Pain Loc --      Pain Education --      Exclude from Growth Chart --    No data found.  Updated Vital Signs BP (!) 170/94 (BP Location: Left Arm)   Pulse 100   Temp 98 F (36.7 C) (Oral)   Resp 20   Ht 6' 1 (1.854 m)   Wt 252 lb (114.3 kg)   SpO2 91%   BMI 33.25 kg/m    Physical Exam Vitals and nursing note reviewed.  Constitutional:      General: He is not in acute distress. HENT:     Mouth/Throat:     Mouth: Mucous membranes are moist.     Pharynx: Oropharynx is clear.  Eyes:     Extraocular Movements: Extraocular movements  intact.     Conjunctiva/sclera: Conjunctivae normal.     Pupils: Pupils are equal, round, and reactive to light.  Cardiovascular:     Rate and Rhythm: Normal rate and regular rhythm.     Pulses: Normal pulses.     Heart sounds: Normal heart sounds.  Pulmonary:     Effort: Pulmonary effort is normal.     Breath sounds: Normal breath sounds.  Abdominal:     Tenderness: There is no right CVA tenderness or left CVA tenderness.  Musculoskeletal:        General: Normal range of motion.     Cervical back: Normal range of motion. No rigidity or tenderness.       Back:     Comments: Points to right low back. No bony tenderness of spine. Good ROM of arms and legs. Strength and sensation intact. No rash noted.  Skin:    General: Skin is warm and dry.  Neurological:     General: No focal deficit present.     Mental Status: He is alert and oriented to person, place, and time.     Cranial Nerves: Cranial nerves 2-12 are intact. No cranial nerve deficit.     Sensory: Sensation is intact.     Motor: Motor function is intact. No weakness.     Coordination: Coordination is intact.     Gait: Gait is intact.     Deep Tendon Reflexes: Reflexes are normal and symmetric.     UC Treatments / Results  Labs (all labs ordered are listed, but only abnormal results are displayed) Labs Reviewed - No data to display  EKG  Radiology No results found.  Procedures Procedures   Medications Ordered in UC Medications  methylPREDNISolone  sodium succinate (SOLU-MEDROL ) 125 mg/2 mL injection 60 mg (60 mg Intramuscular Given 09/26/23 1320)    Initial Impression / Assessment and Plan / UC Course  I have reviewed the triage vital signs and the nursing notes.  Pertinent labs & imaging results that were available during my care of the patient were reviewed by me and considered in my medical decision making (see chart for details).  No back pain red flags. Likely muscular. Discussed with patient. He was  worried about  kidneys, discussed anatomical location of kidney and no CVA tenderness.  IM steroid given for pain, inflammation  Can try baclofen  BID prn with drowsy precautions, can continue ibu or tylenol , hot pad, stretching. Has PCP appointment tomorrow, will follow up with them. ED precautions in the meantime  Final Clinical Impressions(s) / UC Diagnoses   Final diagnoses:  Acute right-sided low back pain without sciatica     Discharge Instructions      The steroid injection will start to work in about 30 minutes  You can take the muscle relaxer Baclofen  twice daily. If the medication makes you drowsy, take only at bed time.  Avoid heavy lifting and twisting  Keep appointment with primary care tomorrow Please go to the emergency department if symptoms worsen.     ED Prescriptions     Medication Sig Dispense Auth. Provider   Baclofen  5 MG TABS Take 1 tablet (5 mg total) by mouth 2 (two) times daily as needed. 20 tablet Shawntez Dickison, [place], PA-C      PDMP not reviewed this encounter.   Jeryl [place]berry, PA-C 09/26/23 [id number]

## 2023-09-26 NOTE — ED Triage Notes (Signed)
 Patient here today with c/o right side LB pain X 1 week and right leg numbness this morning after sitting for a while. Numbness has gone. Patient has been taking IBU with no relief. He is wearing a back brace. Hurts to stand.

## 2023-09-26 NOTE — Discharge Instructions (Addendum)
 The steroid injection will start to work in about 30 minutes  You can take the muscle relaxer Baclofen  twice daily. If the medication makes you drowsy, take only at bed time.  Avoid heavy lifting and twisting  Keep appointment with primary care tomorrow Please go to the emergency department if symptoms worsen.

## 2023-09-27 ENCOUNTER — Ambulatory Visit: Payer: Medicare Other | Admitting: Pharmacist

## 2023-09-27 DIAGNOSIS — K080 Exfoliation of teeth due to systemic causes: Secondary | ICD-10-CM | POA: Diagnosis not present

## 2023-10-01 ENCOUNTER — Ambulatory Visit: Payer: Medicare Other | Attending: Family Medicine | Admitting: Pharmacist

## 2023-10-01 ENCOUNTER — Encounter: Payer: Self-pay | Admitting: Pharmacist

## 2023-10-01 VITALS — BP 147/73

## 2023-10-01 DIAGNOSIS — I10 Essential (primary) hypertension: Secondary | ICD-10-CM

## 2023-10-01 MED ORDER — LOSARTAN POTASSIUM 100 MG PO TABS: 100.0000 mg | ORAL_TABLET | Freq: Every day | ORAL | 1 refills | Status: DC

## 2023-10-01 NOTE — Progress Notes (Signed)
   S:     No chief complaint on file.  68 y.o. male who presents for hypertension evaluation, education, and management.  PMH is significant for HTN, HLD, obesity.  Patient was referred and last seen by Primary Care Provider, Rosaline Bohr, on 07/25/2023. BP at that visit was 143/91 mmHg.   Today, patient arrives in good spirits and presents without assistance. Denies dizziness, headache, blurred vision, swelling.  Family/Social history:  -Fhx: no pertinent positives -Tobacco: former smoker - quit in 2000. -Alcohol: none reported   Medication adherence reported. Patient has taken BP medications today.   Current antihypertensives include: losartan  50 mg daily, hydrochlorothiazide  25 mg daily  Reported home BP readings: none  Patient reported dietary habits:  -Endorses compliance with sodium restriction -Denies any excessive intake of caffeine   Patient-reported exercise habits:  -None  O:  Today's Vitals   10/01/23 1612  BP: (!) 147/73   There is no height or weight on file to calculate BMI.   Last 3 Office BP readings: BP Readings from Last 3 Encounters:  10/01/23 (!) 147/73  09/26/23 (!) 170/94  08/03/23 (!) 174/69    BMET    Component Value Date/Time   NA 138 06/14/2020 1056   NA 141 06/07/2020 0851   K 3.4 (L) 06/14/2020 1056   CL 107 06/14/2020 1056   CO2 19 (L) 06/14/2020 1056   GLUCOSE 118 (H) 06/14/2020 1056   BUN 14 06/14/2020 1056   BUN 12 06/07/2020 0851   CREATININE 1.17 06/14/2020 1056   CREATININE 0.91 07/27/2013 1140   CALCIUM  9.1 06/14/2020 1056   GFRNONAA >60 06/14/2020 1056   GFRAA >60 06/14/2020 1056    Renal function: CrCl cannot be calculated (Patient's most recent lab result is older than the maximum 21 days allowed.).  Clinical ASCVD: No  The ASCVD Risk score (Arnett DK, et al., 2019) failed to calculate for the following reasons:   Cannot find a previous HDL lab   Cannot find a previous total cholesterol lab  Patient is  participating in a Managed Medicaid Plan: No    A/P: Hypertension diagnosed currently above goal on current medications. BP goal < 130/80 mmHg. Medication adherence appears optimal. He got 90-day supplies of both hydrochlorothiazide  and losartan  on 08/03/2023. Tells me he took them today. Will have him increase the losartan  dose and return in 1 month.  -Continued hydrochlorothiazide  25 mg daily.  -Increased dose of losartan  to 100 mg daily. .  -Patient educated on purpose, proper use, and potential adverse effects of losartan .  -F/u labs ordered - BMP anticipated at follow-up in 1 month.  -Counseled on lifestyle modifications for blood pressure control including reduced dietary sodium, increased exercise, adequate sleep. -Encouraged patient to check BP at home and bring log of readings to next visit. Counseled on proper use of home BP cuff.   Results reviewed and written information provided.    Written patient instructions provided. Patient verbalized understanding of treatment plan.  Total time in face to face counseling 30 minutes.    Follow-up:  Pharmacist in 1 month.   Herlene Fleeta Morris, PharmD, JAQUELINE, CPP Clinical Pharmacist [facility] & [facility] [phone]

## 2023-10-08 ENCOUNTER — Telehealth (INDEPENDENT_AMBULATORY_CARE_PROVIDER_SITE_OTHER): Payer: Self-pay | Admitting: Primary Care

## 2023-10-08 NOTE — Telephone Encounter (Signed)
 Called pt to confirm apt. Apt was rescheduled.

## 2023-10-29 ENCOUNTER — Ambulatory Visit (INDEPENDENT_AMBULATORY_CARE_PROVIDER_SITE_OTHER): Payer: Self-pay | Admitting: Primary Care

## 2023-10-30 ENCOUNTER — Ambulatory Visit (INDEPENDENT_AMBULATORY_CARE_PROVIDER_SITE_OTHER): Payer: Medicare Other | Admitting: Primary Care

## 2023-11-03 NOTE — Progress Notes (Signed)
S:     Chief Complaint  Patient presents with   Hypertension   68 y.o. male who presents for hypertension evaluation, education, and management.  PMH is significant for HTN, HLD, obesity.  Patient was referred and last seen by Primary Care Provider, Gwinda Passe, on 07/25/2023. BP at that visit was 143/91 mmHg. At last pharmacy appt on 10/01/23, BP was elevated to 147/73 - he was instructed to increase losartan to 100 mg daily.  Today, patient arrives in good spirits and presents without assistance. Denies dizziness, blurred vision, swelling. He endorses occasional migraine headaches which he takes BC powder and tylenol for. Rarely takes ibuprofen. He reports that he finished his losartan 50 mg tabs by taking two at a time, and has since started taking losartan 100 mg daily. He does not have a home BP cuff.   Family/Social history:  -Fhx: no pertinent positives -Tobacco: former smoker - quit in 2000. -Alcohol: none reported   Medication adherence reported. Patient has taken BP medications today.   Current antihypertensives include: losartan 100 mg daily, hydrochlorothiazide 25 mg daily Current hyperlipidemia medications: atorvastatin 20 mg PO daily   Reported home BP readings: none  Patient reported dietary habits:  -Endorses compliance with sodium restriction -Denies any excessive intake of caffeine  - coffee in the morning  Patient-reported exercise habits:  -None  O:  Today's Vitals   11/04/23 1615  BP: 113/69  Pulse: 71    There is no height or weight on file to calculate BMI.   Last 3 Office BP readings: BP Readings from Last 3 Encounters:  11/04/23 113/69  10/01/23 (!) 147/73  09/26/23 (!) 170/94    BMET    Component Value Date/Time   NA 138 06/14/2020 1056   NA 141 06/07/2020 0851   K 3.4 (L) 06/14/2020 1056   CL 107 06/14/2020 1056   CO2 19 (L) 06/14/2020 1056   GLUCOSE 118 (H) 06/14/2020 1056   BUN 14 06/14/2020 1056   BUN 12 06/07/2020 0851    CREATININE 1.17 06/14/2020 1056   CREATININE 0.91 07/27/2013 1140   CALCIUM 9.1 06/14/2020 1056   GFRNONAA >60 06/14/2020 1056   GFRAA >60 06/14/2020 1056    Lipid Panel     Component Value Date/Time   CHOL 186 06/07/2020 0851   TRIG 67 06/07/2020 0851   HDL 55 06/07/2020 0851   CHOLHDL 3.4 06/07/2020 0851   CHOLHDL 4.1 07/27/2013 1140   VLDL 21 07/27/2013 1140   LDLCALC 118 (H) 06/07/2020 0851   LABVLDL 13 06/07/2020 0851    Renal function: CrCl cannot be calculated (Patient's most recent lab result is older than the maximum 21 days allowed.).  Clinical ASCVD: No  The ASCVD Risk score (Arnett DK, et al., 2019) failed to calculate for the following reasons:   Cannot find a previous HDL lab   Cannot find a previous total cholesterol lab  Patient is participating in a Managed Medicaid Plan: No   A/P: Hypertension diagnosed currently controlled below goal on current medications. BP goal < 130/80 mmHg. Medication adherence appears optimal. Obtaining BMP today to monitor potassium and kidney function after increasing losartan. If stable, appropriate to continue current regimen. Noted that patient has not had repeat lipid panel since starting atorvastatin several years ago - will evaluate today to guided management of ASCVD risk.  -Continued hydrochlorothiazide 25 mg daily.  -Continued losartan 100 mg daily.  -Patient educated on purpose, proper use, and potential adverse effects of losartan and hydrochlorothiazide.  -  F/u labs ordered - BMP, lipid panel  -Counseled on lifestyle modifications for blood pressure control including reduced dietary sodium, increased exercise, adequate sleep. -Encouraged patient to check BP at home and bring log of readings to next visit. Counseled on proper use of home BP cuff. Advised patient on where he may buy a BP cuff. -Advised patient against use of BC powder to reduce risk of bleeding. Encourage use of tylenol instad.   Results reviewed and  written information provided.    Written patient instructions provided. Patient verbalized understanding of treatment plan.  Total time in face to face counseling 25 minutes.    Follow-up:  Pharmacist in 2 months.   Nils Pyle, PharmD PGY1 Pharmacy Resident  Butch Penny, PharmD, BCACP, CPP Clinical Pharmacist [facility] & [facility] [phone]

## 2023-11-04 ENCOUNTER — Encounter: Payer: Self-pay | Admitting: Pharmacist

## 2023-11-04 ENCOUNTER — Ambulatory Visit: Payer: Medicare Other | Attending: Primary Care | Admitting: Pharmacist

## 2023-11-04 VITALS — BP 113/69 | HR 71

## 2023-11-04 DIAGNOSIS — E782 Mixed hyperlipidemia: Secondary | ICD-10-CM

## 2023-11-04 DIAGNOSIS — I10 Essential (primary) hypertension: Secondary | ICD-10-CM

## 2023-11-05 LAB — BASIC METABOLIC PANEL
BUN/Creatinine Ratio: 12 (ref 10–24)
BUN: 14 mg/dL (ref 8–27)
CO2: 23 mmol/L (ref 20–29)
Calcium: 9 mg/dL (ref 8.6–10.2)
Chloride: 101 mmol/L (ref 96–106)
Creatinine, Ser: 1.14 mg/dL (ref 0.76–1.27)
Glucose: 82 mg/dL (ref 70–99)
Potassium: 4.2 mmol/L (ref 3.5–5.2)
Sodium: 137 mmol/L (ref 134–144)
eGFR: 71 mL/min/{1.73_m2} (ref >59)

## 2023-11-05 LAB — LIPID PANEL
Chol/HDL Ratio: 2.9 {ratio} (ref 0.0–5.0)
Cholesterol, Total: 159 mg/dL (ref 100–199)
HDL: 54 mg/dL (ref >39)
LDL Chol Calc (NIH): 87 mg/dL (ref 0–99)
Triglycerides: 100 mg/dL (ref 0–149)
VLDL Cholesterol Cal: 18 mg/dL (ref 5–40)

## 2023-11-06 ENCOUNTER — Ambulatory Visit (HOSPITAL_COMMUNITY)
Admission: EM | Admit: 2023-11-06 | Discharge: 2023-11-06 | Disposition: A | Discharge status: Home / Self Care | Payer: Medicare Other | Attending: Physician Assistant | Admitting: Physician Assistant

## 2023-11-06 ENCOUNTER — Encounter (HOSPITAL_COMMUNITY): Payer: Self-pay

## 2023-11-06 ENCOUNTER — Ambulatory Visit (INDEPENDENT_AMBULATORY_CARE_PROVIDER_SITE_OTHER): Payer: Medicare Other

## 2023-11-06 DIAGNOSIS — R062 Wheezing: Secondary | ICD-10-CM

## 2023-11-06 DIAGNOSIS — R059 Cough, unspecified: Secondary | ICD-10-CM | POA: Diagnosis not present

## 2023-11-06 DIAGNOSIS — R0902 Hypoxemia: Secondary | ICD-10-CM | POA: Diagnosis not present

## 2023-11-06 DIAGNOSIS — J069 Acute upper respiratory infection, unspecified: Secondary | ICD-10-CM | POA: Diagnosis not present

## 2023-11-06 DIAGNOSIS — R051 Acute cough: Secondary | ICD-10-CM

## 2023-11-06 DIAGNOSIS — I771 Stricture of artery: Secondary | ICD-10-CM | POA: Diagnosis not present

## 2023-11-06 LAB — POC COVID19/FLU A&B COMBO
Covid Antigen, POC: NEGATIVE
Influenza A Antigen, POC: NEGATIVE
Influenza B Antigen, POC: NEGATIVE

## 2023-11-06 MED ORDER — IPRATROPIUM-ALBUTEROL 0.5-2.5 (3) MG/3ML IN SOLN
RESPIRATORY_TRACT | Status: AC
  Filled 2023-11-06: qty 3

## 2023-11-06 MED ORDER — IPRATROPIUM-ALBUTEROL 0.5-2.5 (3) MG/3ML IN SOLN
3.0000 mL | Freq: Once | RESPIRATORY_TRACT | Status: AC
  Administered 2023-11-06: 3 mL via RESPIRATORY_TRACT

## 2023-11-06 NOTE — Discharge Instructions (Addendum)

## 2023-11-06 NOTE — ED Triage Notes (Signed)
Patient c/o a non productive cough and nasal congestion since this AM.  Patient states he has taken Mucinex, Alka Seltzer Cold and Flu and TheraFlu.

## 2023-11-06 NOTE — ED Provider Notes (Signed)
[facility]    CSN: [id number] Arrival date & time: 11/06/23  1734      History   Chief Complaint Chief Complaint  Patient presents with   Cough   Nasal Congestion    HPI Randall Cook is a 68 y.o. male.   HPI   He states he started having some cough this AM while taking the trash out  He states he took mucinex in the AM and then around lunch he took Geneticist, molecular and theraflu He has also been using Halls cough drops  He denies known sick contacts   Past Medical History:  Diagnosis Date   Arthritis    Dysrhythmia    tachycardia- EKG,clearance 02/29/12 Dr Azucena Cecil  on chart   GERD (gastroesophageal reflux disease)    Hyperlipidemia    Hypertension    LOV,   Dr Azucena Cecil 02/29/12 on chart-   CBC with diff, [facility] 02/29/12 on chart   Sleep apnea    STOP BANG SCORE 4    Patient Active Problem List   Diagnosis Date Noted   Obesity (BMI 30.0-34.9) 10/02/2019   Acute hypoxemic respiratory failure due to COVID-19 [facility]) 09/27/2019   Hypertension 01/07/2018   Hyperlipidemia 04/09/2017   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 Authorizing Provider  atorvastatin (LIPITOR) 20 MG tablet Take 1 tablet (20 mg total) by mouth daily. 07/25/23   Grayce Sessions, NP  Baclofen 5 MG TABS Take 1 tablet (5 mg total) by mouth 2 (two) times daily as needed. 09/26/23   Rising, Lurena Joiner, PA-C  diclofenac Sodium (VOLTAREN) 1 % GEL Apply 4 g topically 4 (four) times daily. Apply to affected areas 4 times daily as needed for pain. 07/25/23   Grayce Sessions, NP  hydrochlorothiazide (HYDRODIURIL) 25 MG tablet Take 1 tablet daily in the morning 07/25/23    Grayce Sessions, NP  losartan (COZAAR) 100 MG tablet Take 1 tablet (100 mg total) by mouth daily. 10/01/23   Hoy Register, MD    Family History History reviewed. No pertinent family history.  Social History Social History   Tobacco Use   Smoking status: Former    Current packs/day: 0.00    Average packs/day: 0.5 packs/day for 20.0 years (10.0 ttl pk-yrs)    Types: Cigarettes    Start date: 03/05/1979    Quit date: 03/05/1999    Years since quitting: 24.6   Smokeless tobacco: Never  Vaping Use   Vaping status: Never Used  Substance Use Topics   Alcohol use: Not Currently    Comment: none x 20 yrs   Drug use: No     Allergies   Penicillins   Review of Systems Review of Systems  Constitutional:  Positive for chills. Negative for fever.  HENT:  Positive for congestion and rhinorrhea. Negative for ear pain and sore throat.   Respiratory:  Positive for cough. Negative for shortness of breath and wheezing.   Gastrointestinal:  Negative for diarrhea, nausea and vomiting.  Musculoskeletal:  Negative for myalgias.     Physical Exam Triage Vital Signs ED Triage Vitals  Encounter Vitals Group  BP 11/06/23 1824 (!) 145/84     Systolic BP Percentile --      Diastolic BP Percentile --      Pulse Rate 11/06/23 1824 (!) 112     Resp --      Temp 11/06/23 1824 98.7 F (37.1 C)     Temp Source 11/06/23 1824 Oral     SpO2 11/06/23 1824 92 %     Weight --      Height --      Head Circumference --      Peak Flow --      Pain Score 11/06/23 1828 0     Pain Loc --      Pain Education --      Exclude from Growth Chart --    No data found.  Updated Vital Signs BP (!) 145/84 (BP Location: Left Arm)   Pulse (!) 112   Temp 98.7 F (37.1 C) (Oral)   SpO2 95%   Visual Acuity Right Eye Distance:   Left Eye Distance:   Bilateral Distance:    Right Eye Near:   Left Eye Near:    Bilateral Near:     Physical Exam Vitals reviewed.  Constitutional:      General:  He is awake.     Appearance: Normal appearance. He is well-developed and well-groomed.  HENT:     Head: Normocephalic and atraumatic.     Right Ear: Hearing, tympanic membrane and ear canal normal.     Left Ear: Hearing, tympanic membrane and ear canal normal.     Mouth/Throat:     Lips: Pink.     Mouth: Mucous membranes are moist.     Pharynx: Oropharynx is clear. Uvula midline. No pharyngeal swelling, oropharyngeal exudate, posterior oropharyngeal erythema, uvula swelling or postnasal drip.     Tonsils: No tonsillar exudate or tonsillar abscesses.  Eyes:     General: Lids are normal. Gaze aligned appropriately.     Extraocular Movements: Extraocular movements intact.  Cardiovascular:     Rate and Rhythm: Normal rate and regular rhythm.     Heart sounds: Normal heart sounds.  Pulmonary:     Effort: Pulmonary effort is normal.     Breath sounds: No decreased air movement. Examination of the right-middle field reveals wheezing. Examination of the left-middle field reveals wheezing. Examination of the right-lower field reveals decreased breath sounds and wheezing. Examination of the left-lower field reveals decreased breath sounds and wheezing. Decreased breath sounds and wheezing present. No rhonchi or rales.  Musculoskeletal:     Cervical back: Normal range of motion and neck supple.  Lymphadenopathy:     Head:     Right side of head: No submental, submandibular or preauricular adenopathy.     Left side of head: No submental, submandibular or preauricular adenopathy.     Cervical:     Right cervical: No superficial cervical adenopathy.    Left cervical: No superficial cervical adenopathy.     Upper Body:     Right upper body: No supraclavicular adenopathy.     Left upper body: No supraclavicular adenopathy.  Skin:    General: Skin is warm and dry.  Neurological:     General: No focal deficit present.     Mental Status: He is alert and oriented to person, place, and time.   Psychiatric:        Mood and Affect: Mood normal.        Behavior: Behavior normal. Behavior is cooperative.  UC Treatments / Results  Labs (all labs ordered are listed, but only abnormal results are displayed) Labs Reviewed  POC COVID19/FLU A&B COMBO    EKG   Radiology DG Chest 2 View Result Date: 11/06/2023 CLINICAL DATA:  Wheezing, low O2 sats, cough EXAM: CHEST - 2 VIEW COMPARISON:  08/04/2021 FINDINGS: Heart is normal size. Tortuous aorta. No acute confluent airspace opacities or effusions. No acute bony abnormality. IMPRESSION: No active cardiopulmonary disease. Electronically Signed   By: Charlett Nose M.D.   On: 11/06/2023 20:25    Procedures Procedures (including critical care time)  Medications Ordered in UC Medications  ipratropium-albuterol (DUONEB) 0.5-2.5 (3) MG/3ML nebulizer solution 3 mL (3 mLs Nebulization Given 11/06/23 1949)    Initial Impression / Assessment and Plan / UC Course  I have reviewed the triage vital signs and the nursing notes.  Pertinent labs & imaging results that were available during my care of the patient were reviewed by me and considered in my medical decision making (see chart for details).     Patient presents today with concerns for nonproductive coughing.  Physical exam is notable for wheezes and vitals show oxygen saturation of 92%.  While sitting and talking with me he did get up to about 95% oxygen saturation.  Review of previous urgent care encounters show on 09/26/2023 he had 91% oxygen saturation.  He also had 94% on 08/03/2023.  Suspect that he might be around his baseline.  Will get chest x-ray for definitive rule out of pneumonia.  Will also do DuoNeb treatment to see if this improves perfusion.  Final Clinical Impressions(s) / UC Diagnoses   Final diagnoses:  Wheezing  Acute cough  Upper respiratory tract infection, unspecified type   Acute, new concern Patient reports that he started having a nonproductive cough  this morning.  Vitals are concerning for elevated heart rate and oxygen saturation of 92%.  Throughout course of conversation oxygen saturation fluctuated up to 95%.  It did improve to about 95% after DuoNeb was applied.  Repeat lung exam did not note improved air movement.  Chest x-ray was negative for acute cardiopulmonary disease.  At this time I suspect he likely has ongoing mild hypoxia and he is at baseline.  Recommend over-the-counter medications for symptomatic management of current URI symptoms.  Reviewed ED and return precautions.  These were provided in after visit summary.  Follow-up as needed for progressing or persistent symptoms.    Discharge Instructions      Based on your described symptoms and the duration of symptoms it is likely that you have a viral upper respiratory infection (often called a "cold")  Symptoms can last for 3-10 days with lingering cough and intermittent symptoms lasting weeks after that.  The goal of treatment at this time is to reduce your symptoms and discomfort   I recommend using Robitussin and Mucinex (regular formulations, nothing with decongestants or DM)  You can also use Tylenol for body aches and fever reduction I also recommend adding an antihistamine to your daily regimen This includes medications like Claritin, Allegra, Zyrtec- the generics of these work very well and are usually less expensive I recommend using Flonase nasal spray - 2 puffs twice per day to help with your nasal congestion The antihistamines and Flonase can take a few weeks to provide significant relief from allergy symptoms but should start to provide some benefit soon. You can use a humidifier at night to help with preventing nasal dryness and irritation   If  your symptoms do not improve or become worse in the next 5-7 days please make an apt at the office so we can see you  Go to the ER if you begin to have more serious symptoms such as shortness of breath, trouble  breathing, loss of consciousness, swelling around the eyes, high fever, severe lasting headaches, vision changes or neck pain/stiffness.       ED Prescriptions   None    PDMP not reviewed this encounter.   Roselind Messier 11/06/23 2035

## 2023-11-19 ENCOUNTER — Ambulatory Visit (INDEPENDENT_AMBULATORY_CARE_PROVIDER_SITE_OTHER): Payer: Medicare Other | Admitting: Primary Care

## 2023-11-27 ENCOUNTER — Ambulatory Visit (INDEPENDENT_AMBULATORY_CARE_PROVIDER_SITE_OTHER): Payer: Medicare Other | Admitting: Primary Care

## 2023-12-03 ENCOUNTER — Other Ambulatory Visit (INDEPENDENT_AMBULATORY_CARE_PROVIDER_SITE_OTHER): Payer: Self-pay | Admitting: Primary Care

## 2023-12-03 DIAGNOSIS — E782 Mixed hyperlipidemia: Secondary | ICD-10-CM

## 2023-12-03 DIAGNOSIS — I10 Essential (primary) hypertension: Secondary | ICD-10-CM

## 2023-12-03 DIAGNOSIS — Z760 Encounter for issue of repeat prescription: Secondary | ICD-10-CM

## 2023-12-03 NOTE — Telephone Encounter (Signed)
 Copied from CRM [id number]. Topic: Clinical - Medication Refill >> Dec 03, 2023  9:51 AM Franchot Heidelberg wrote: Most Recent Primary Care Visit:  Provider: Drucilla Chalet  Department: [facility]  Visit Type: OFFICE VISIT  Date: 11/04/2023  Medication: atorvastatin (LIPITOR) 20 MG tablet hydrochlorothiazide (HYDRODIURIL) 25 MG tablet  Has the patient contacted their pharmacy? Yes (Agent: If no, request that the patient contact the pharmacy for the refill. If patient does not wish to contact the pharmacy document the reason why and proceed with request.) (Agent: If yes, when and what did the pharmacy advise?)  Is this the correct pharmacy for this prescription? Yes If no, delete pharmacy and type the correct one.  This is the patient's preferred pharmacy:   [facility] [address] [place], [place] - [address] [place] [address] Phone: [phone] Fax: [phone]   Has the prescription been filled recently? Yes  Is the patient out of the medication? Yes  Has the patient been seen for an appointment in the last year OR does the patient have an upcoming appointment? Yes  Can we respond through MyChart? Yes  Agent: Please be advised that Rx refills may take up to 3 business days. We ask that you follow-up with your pharmacy.

## 2023-12-04 MED ORDER — ATORVASTATIN CALCIUM 20 MG PO TABS: 20.0000 mg | ORAL_TABLET | Freq: Every day | ORAL | 0 refills | Status: AC

## 2023-12-04 MED ORDER — HYDROCHLOROTHIAZIDE 25 MG PO TABS: ORAL_TABLET | ORAL | 0 refills | Status: DC

## 2023-12-04 NOTE — Telephone Encounter (Signed)
 Requested Prescriptions  Pending Prescriptions Disp Refills   atorvastatin (LIPITOR) 20 MG tablet 90 tablet 0    Sig: Take 1 tablet (20 mg total) by mouth daily.     Cardiovascular:  Antilipid - Statins Failed - 12/04/2023  9:38 AM      Failed - Lipid Panel in normal range within the last 12 months    Cholesterol, Total  Date Value Ref Range Status  11/04/2023 159 100 - 199 mg/dL Final   LDL Chol Calc (NIH)  Date Value Ref Range Status  11/04/2023 87 0 - 99 mg/dL Final   HDL  Date Value Ref Range Status  11/04/2023 54 >39 mg/dL Final   Triglycerides  Date Value Ref Range Status  11/04/2023 100 0 - 149 mg/dL Final         Passed - Patient is not pregnant      Passed - Valid encounter within last 12 months    Recent Outpatient Visits           1 month ago Primary hypertension   [facility] [place] Drucilla Chalet, RPH-CPP   2 months ago Primary hypertension   [place] [facility] [place]. [facility] Drucilla Chalet, RPH-CPP   4 months ago Medication refill   [place] [facility] Grayce Sessions, NP   2 years ago Need for Tdap vaccination   [place] [facility] Grayce Sessions, NP   2 years ago Primary hypertension   [place] [facility] Grayce Sessions, NP       Future Appointments             In 1 month Lois Huxley, Cornelius Moras, RPH-CPP [facility] [place]. [facility]             hydrochlorothiazide (HYDRODIURIL) 25 MG tablet 90 tablet 0    Sig: Take 1 tablet daily in the morning     Cardiovascular: Diuretics - Thiazide Failed - 12/04/2023  9:38 AM      Failed - Last BP in normal range    BP Readings from Last 1 Encounters:  11/06/23 (!) 145/84         Passed - Cr in normal range and within 180 days    Creat  Date Value Ref Range Status   07/27/2013 0.91 0.50 - 1.35 mg/dL Final   Creatinine, Ser  Date Value Ref Range Status  11/04/2023 1.14 0.76 - 1.27 mg/dL Final         Passed - K in normal range and within 180 days    Potassium  Date Value Ref Range Status  11/04/2023 4.2 3.5 - 5.2 mmol/L Final         Passed - Na in normal range and within 180 days    Sodium  Date Value Ref Range Status  11/04/2023 137 134 - 144 mmol/L Final         Passed - Valid encounter within last 6 months    Recent Outpatient Visits           1 month ago Primary hypertension   [place] [facility] . [facility] Drucilla Chalet, RPH-CPP   2 months ago Primary hypertension    [facility] [place]. [facility] Drucilla Chalet, RPH-CPP   4 months ago Medication refill   [place] [facility] Grayce Sessions, NP   2 years ago Need for Tdap vaccination   [place] [facility] Grayce Sessions, NP   2 years ago Primary hypertension   [place] [facility] Grayce Sessions, NP       Future Appointments             In 1 month Lois Huxley, Cornelius Moras, RPH-CPP  [facility] Of . [facility]

## 2023-12-18 ENCOUNTER — Encounter (INDEPENDENT_AMBULATORY_CARE_PROVIDER_SITE_OTHER): Payer: Self-pay

## 2023-12-18 ENCOUNTER — Ambulatory Visit (INDEPENDENT_AMBULATORY_CARE_PROVIDER_SITE_OTHER): Admitting: *Deleted

## 2023-12-18 DIAGNOSIS — Z0000 Encounter for general adult medical examination without abnormal findings: Secondary | ICD-10-CM | POA: Diagnosis not present

## 2023-12-18 NOTE — Progress Notes (Signed)
 Subjective:   Randall Cook is a 68 y.o. male who presents for an Initial Medicare Annual Wellness Visit.  Visit Complete: Virtual I connected with  Randall Cook on 12/18/23 by a audio enabled telemedicine application and verified that I am speaking with the correct person using two identifiers.  Patient Location: Home  Provider Location: Home Office  I discussed the limitations of evaluation and management by telemedicine. The patient expressed understanding and agreed to proceed.  Vital Signs: Because this visit was a virtual/telehealth visit, some criteria may be missing or patient reported. Any vitals not documented were not able to be obtained and vitals that have been documented are patient reported.   Cardiac Risk Factors include: advanced age (>68men, >65 women);male gender;hypertension     Objective:    There were no vitals filed for this visit. There is no height or weight on file to calculate BMI.     12/18/2023    3:56 PM 09/28/2019    1:34 AM 12/26/2017    5:05 AM 07/12/2017    8:38 AM 04/11/2017    8:45 AM 03/28/2017    2:42 PM 07/15/2016    6:56 PM  Advanced Directives  Does Patient Have a Medical Advance Directive? No No No No No No No  Would patient like information on creating a medical advance directive? No - Patient declined No - Patient declined  No - Patient declined No - Patient declined No - Patient declined No - patient declined information    Current Medications (verified) Outpatient Encounter Medications as of 12/18/2023  Medication Sig   atorvastatin (LIPITOR) 20 MG tablet Take 1 tablet (20 mg total) by mouth daily.   Baclofen 5 MG TABS Take 1 tablet (5 mg total) by mouth 2 (two) times daily as needed.   diclofenac Sodium (VOLTAREN) 1 % GEL Apply 4 g topically 4 (four) times daily. Apply to affected areas 4 times daily as needed for pain.   hydrochlorothiazide (HYDRODIURIL) 25 MG tablet Take 1 tablet daily in the morning   losartan (COZAAR) 100 MG  tablet Take 1 tablet (100 mg total) by mouth daily.   No facility-administered encounter medications on file as of 12/18/2023.    Allergies (verified) Penicillins   History: Past Medical History:  Diagnosis Date   Arthritis    Dysrhythmia    tachycardia- EKG,clearance 02/29/12 Dr Azucena Cecil  on chart   GERD (gastroesophageal reflux disease)    Hyperlipidemia    Hypertension    LOV,   Dr Azucena Cecil 02/29/12 on chart-   CBC with diff, [facility] 02/29/12 on chart   Sleep apnea    STOP BANG SCORE 4   Past Surgical History:  Procedure Laterality Date   NO PAST SURGERIES     PARTIAL KNEE ARTHROPLASTY  03/10/2012   Procedure: UNICOMPARTMENTAL KNEE;  Surgeon: [name], MD;  Location: [facility];  Service: Orthopedics;  Laterality: Left;   History reviewed. No pertinent family history. Social History   Socioeconomic History   Marital status: Married    Spouse name: Not on file   Number of children: Not on file   Years of education: Not on file   Highest education level: Not on file  Occupational History   Not on file  Tobacco Use   Smoking status: Former    Current packs/day: 0.00    Average packs/day: 0.5 packs/day for 20.0 years (10.0 ttl pk-yrs)    Types: Cigarettes    Start date: 03/05/1979  Quit date: 03/05/1999    Years since quitting: 24.8   Smokeless tobacco: Never  Vaping Use   Vaping status: Never Used  Substance and Sexual Activity   Alcohol use: Not Currently    Comment: none x 20 yrs   Drug use: No   Sexual activity: Yes  Other Topics Concern   Not on file  Social History Narrative   Not on file   Social Drivers of Health   Financial Resource Strain: Low Risk  (12/18/2023)   Overall Financial Resource Strain (CARDIA)    Difficulty of Paying Living Expenses: Not hard at all  Food Insecurity: No Food Insecurity (12/18/2023)   Hunger Vital Sign    Worried About Running Out of Food in the Last Year: Never true    Ran Out of Food in the Last Year: Never true   Transportation Needs: No Transportation Needs (12/18/2023)   PRAPARE - Administrator, Civil Service (Medical): No    Lack of Transportation (Non-Medical): No  Physical Activity: Sufficiently Active (12/18/2023)   Exercise Vital Sign    Days of Exercise per Week: 5 days    Minutes of Exercise per Session: 50 min  Stress: No Stress Concern Present (12/18/2023)   [employer] of Occupational Health - Occupational Stress Questionnaire    Feeling of Stress : Not at all  Social Connections: Socially Integrated (12/18/2023)   Social Connection and Isolation Panel [NHANES]    Frequency of Communication with Friends and Family: More than three times a week    Frequency of Social Gatherings with Friends and Family: More than three times a week    Attends Religious Services: More than 4 times per year    Active Member of [employer] or Organizations: Patient unable to answer    Attends Engineer, structural: More than 4 times per year    Marital Status: Married    Tobacco Counseling Counseling given: Not Answered   Clinical Intake:  Pre-visit preparation completed: Yes  Pain : No/denies pain     Diabetes: No  How often do you need to have someone help you when you read instructions, pamphlets, or other written materials from your doctor or pharmacy?: 1 - Never  Interpreter Needed?: No  Information entered by :: Remi Haggard LPN   Activities of Daily Living    12/18/2023    3:56 PM  In your present state of health, do you have any difficulty performing the following activities:  Hearing? 0  Vision? 0  Difficulty concentrating or making decisions? 0  Walking or climbing stairs? 0  Dressing or bathing? 0  Doing errands, shopping? 0  Preparing Food and eating ? N  Using the Toilet? N  In the past six months, have you accidently leaked urine? N  Do you have problems with loss of bowel control? N  Managing your Medications? N  Managing your Finances? N     Patient Care Team: Grayce Sessions, NP as PCP - General (Internal Medicine)  Indicate any recent Medical Services you may have received from other than [facility] providers in the past year (date may be approximate).     Assessment:   This is a routine wellness examination for Randall Cook.  Hearing/Vision screen Hearing Screening - Comments:: No trouble hearing Vision Screening - Comments:: Not up date   Goals Addressed             This Visit's Progress    Patient Stated  Continue current lifestyle       Depression Screen    12/18/2023    3:57 PM 07/25/2023    5:18 PM 01/11/2021    4:16 PM 12/05/2020    3:50 PM 06/06/2020    4:12 PM 12/03/2019    4:08 PM 11/12/2019    1:28 PM  PHQ 2/9 Scores  PHQ - 2 Score 0 0 0 0 0 0 0  PHQ- 9 Score 0 0    0     Fall Risk    12/18/2023    3:54 PM 07/25/2023    5:17 PM 01/11/2021    4:16 PM 12/05/2020    3:50 PM 06/06/2020    4:19 PM  Fall Risk   Falls in the past year? 0 0 0 0 0  Number falls in past yr: 0 0     Injury with Fall? 0 0     Risk for fall due to :  No Fall Risks     Follow up Falls evaluation completed;Education provided;Falls prevention discussed        MEDICARE RISK AT HOME: Medicare Risk at Home If so, are there any without handrails?: No Home free of loose throw rugs in walkways, pet beds, electrical cords, etc?: Yes Adequate lighting in your home to reduce risk of falls?: Yes Life alert?: No Use of a cane, walker or w/c?: No Grab bars in the bathroom?: Yes Shower chair or bench in shower?: Yes Elevated toilet seat or a handicapped toilet?: Yes  TIMED UP AND GO:  Was the test performed? No    Cognitive Function:        12/18/2023    3:55 PM  6CIT Screen  What Year? 0 points  What month? 0 points  What time? 0 points  Count back from 20 0 points  Months in reverse 0 points  Repeat phrase 0 points  Total Score 0 points    Immunizations Immunization History  Administered Date(s)  Administered   Influenza,inj,Quad PF,6+ Mos 06/06/2020   PFIZER(Purple Top)SARS-COV-2 Vaccination 01/07/2020, 02/01/2020, 08/27/2020   Tdap 01/11/2021    TDAP status: Up to date  Flu Vaccine status: Due, Education has been provided regarding the importance of this vaccine. Advised may receive this vaccine at local pharmacy or Health Dept. Aware to provide a copy of the vaccination record if obtained from local pharmacy or Health Dept. Verbalized acceptance and understanding.  Pneumococcal vaccine status: Due, Education has been provided regarding the importance of this vaccine. Advised may receive this vaccine at local pharmacy or Health Dept. Aware to provide a copy of the vaccination record if obtained from local pharmacy or Health Dept. Verbalized acceptance and understanding.  Covid-19 vaccine status: Information provided on how to obtain vaccines.   Qualifies for Shingles Vaccine? Yes   Zostavax completed No   Shingrix Completed?: No.    Education has been provided regarding the importance of this vaccine. Patient has been advised to call insurance company to determine out of pocket expense if they have not yet received this vaccine. Advised may also receive vaccine at local pharmacy or Health Dept. Verbalized acceptance and understanding.  Screening Tests Health Maintenance  Topic Date Due   Colonoscopy  Never done   Zoster Vaccines- Shingrix (1 of 2) Never done   Pneumonia Vaccine 89+ Years old (1 of 1 - PCV) Never done   COVID-19 Vaccine (4 - 2024-25 season) 05/26/2023   INFLUENZA VACCINE  12/23/2023 (Originally 04/25/2023)   Medicare Annual Wellness (AWV)  12/17/2024   DTaP/Tdap/Td (2 - Td or Tdap) 01/12/2031   Hepatitis C Screening  Completed   HPV VACCINES  Aged Out    Health Maintenance  Health Maintenance Due  Topic Date Due   Colonoscopy  Never done   Zoster Vaccines- Shingrix (1 of 2) Never done   Pneumonia Vaccine 71+ Years old (1 of 1 - PCV) Never done   COVID-19  Vaccine (4 - 2024-25 season) 05/26/2023    Colonoscopy  Patient states he has had normal Colonoscopy  patient unsure of name but states was within last 10 years .      Lung Cancer Screening: (Low Dose CT Chest recommended if Age 77-80 years, 20 pack-year currently smoking OR have quit w/in 15years.) does not qualify.   Lung Cancer Screening Referral:   Additional Screening:  Hepatitis C Screening: does not qualify; Completed 2021  Vision Screening: Recommended annual ophthalmology exams for early detection of glaucoma and other disorders of the eye. Is the patient up to date with their annual eye exam?  No  Who is the provider or what is the name of the office in which the patient attends annual eye exams?  If pt is not established with a provider, would they like to be referred to a provider to establish care? No .   Dental Screening: Recommended annual dental exams for proper oral hygiene   Community Resource Referral / Chronic Care Management: CRR required this visit?  No   CCM required this visit?  No    Plan:     I have personally reviewed and noted the following in the patient's chart:   Medical and social history Use of alcohol, tobacco or illicit drugs  Current medications and supplements including opioid prescriptions. Patient is not currently taking opioid prescriptions. Functional ability and status Nutritional status Physical activity Advanced directives List of other physicians Hospitalizations, surgeries, and ER visits in previous 12 months Vitals Screenings to include cognitive, depression, and falls Referrals and appointments  In addition, I have reviewed and discussed with patient certain preventive protocols, quality metrics, and best practice recommendations. A written personalized care plan for preventive services as well as general preventive health recommendations were provided to patient.     Remi Haggard, LPN   1/61/0960   After Visit Summary:  (MyChart) Due to this being a telephonic visit, the after visit summary with patients personalized plan was offered to patient via MyChart   Nurse Notes:

## 2023-12-18 NOTE — Patient Instructions (Signed)
 [name] , Thank you for taking time to come for your Medicare Wellness Visit. I appreciate your ongoing commitment to your health goals. Please review the following plan we discussed and let me know if I can assist you in the future.   Screening recommendations/referrals: Colonoscopy: Education provided Recommended yearly ophthalmology/optometry visit for glaucoma screening and checkup Recommended yearly dental visit for hygiene and checkup  Vaccinations: Influenza vaccine: Education provided Pneumococcal vaccine: Education provided Tdap vaccine: Education provided Shingles vaccine: Education provided      Preventive Care 65 Years and Older, Male Preventive care refers to lifestyle choices and visits with your health care provider that can promote health and wellness. What does preventive care include? A yearly physical exam. This is also called an annual well check. Dental exams once or twice a year. Routine eye exams. Ask your health care provider how often you should have your eyes checked. Personal lifestyle choices, including: Daily care of your teeth and gums. Regular physical activity. Eating a healthy diet. Avoiding tobacco and drug use. Limiting alcohol use. Practicing safe sex. Taking low doses of aspirin every day. Taking vitamin and mineral supplements as recommended by your health care provider. What happens during an annual well check? The services and screenings done by your health care provider during your annual well check will depend on your age, overall health, lifestyle risk factors, and family history of disease. Counseling  Your health care provider may ask you questions about your: Alcohol use. Tobacco use. Drug use. Emotional well-being. Home and relationship well-being. Sexual activity. Eating habits. History of falls. Memory and ability to understand (cognition). Work and work Astronomer. Screening  You may have the following tests or  measurements: Height, weight, and BMI. Blood pressure. Lipid and cholesterol levels. These may be checked every 5 years, or more frequently if you are over 105 years old. Skin check. Lung cancer screening. You may have this screening every year starting at age 70 if you have a 30-pack-year history of smoking and currently smoke or have quit within the past 15 years. Fecal occult blood test (FOBT) of the stool. You may have this test every year starting at age 13. Flexible sigmoidoscopy or colonoscopy. You may have a sigmoidoscopy every 5 years or a colonoscopy every 10 years starting at age 55. Prostate cancer screening. Recommendations will vary depending on your family history and other risks. Hepatitis C blood test. Hepatitis B blood test. Sexually transmitted disease (STD) testing. Diabetes screening. This is done by checking your blood sugar (glucose) after you have not eaten for a while (fasting). You may have this done every 1-3 years. Abdominal aortic aneurysm (AAA) screening. You may need this if you are a current or former smoker. Osteoporosis. You may be screened starting at age 17 if you are at high risk. Talk with your health care provider about your test results, treatment options, and if necessary, the need for more tests. Vaccines  Your health care provider may recommend certain vaccines, such as: Influenza vaccine. This is recommended every year. Tetanus, diphtheria, and acellular pertussis (Tdap, Td) vaccine. You may need a Td booster every 10 years. Zoster vaccine. You may need this after age 19. Pneumococcal 13-valent conjugate (PCV13) vaccine. One dose is recommended after age 73. Pneumococcal polysaccharide (PPSV23) vaccine. One dose is recommended after age 27. Talk to your health care provider about which screenings and vaccines you need and how often you need them. This information is not intended to replace advice given to  you by your health care provider. Make sure  you discuss any questions you have with your health care provider. Document Released: 10/07/2015 Document Revised: 05/30/2016 Document Reviewed: 07/12/2015 Elsevier Interactive Patient Education  2017 [employer].  Fall Prevention in the Home Falls can cause injuries. They can happen to people of all ages. There are many things you can do to make your home safe and to help prevent falls. What can I do on the outside of my home? Regularly fix the edges of walkways and driveways and fix any cracks. Remove anything that might make you trip as you walk through a door, such as a raised step or threshold. Trim any bushes or trees on the path to your home. Use bright outdoor lighting. Clear any walking paths of anything that might make someone trip, such as rocks or tools. Regularly check to see if handrails are loose or broken. Make sure that both sides of any steps have handrails. Any raised decks and porches should have guardrails on the edges. Have any leaves, snow, or ice cleared regularly. Use sand or salt on walking paths during winter. Clean up any spills in your garage right away. This includes oil or grease spills. What can I do in the bathroom? Use night lights. Install grab bars by the toilet and in the tub and shower. Do not use towel bars as grab bars. Use non-skid mats or decals in the tub or shower. If you need to sit down in the shower, use a plastic, non-slip stool. Keep the floor dry. Clean up any water that spills on the floor as soon as it happens. Remove soap buildup in the tub or shower regularly. Attach bath mats securely with double-sided non-slip rug tape. Do not have throw rugs and other things on the floor that can make you trip. What can I do in the bedroom? Use night lights. Make sure that you have a light by your bed that is easy to reach. Do not use any sheets or blankets that are too big for your bed. They should not hang down onto the floor. Have a firm  chair that has side arms. You can use this for support while you get dressed. Do not have throw rugs and other things on the floor that can make you trip. What can I do in the kitchen? Clean up any spills right away. Avoid walking on wet floors. Keep items that you use a lot in easy-to-reach places. If you need to reach something above you, use a strong step stool that has a grab bar. Keep electrical cords out of the way. Do not use floor polish or wax that makes floors slippery. If you must use wax, use non-skid floor wax. Do not have throw rugs and other things on the floor that can make you trip. What can I do with my stairs? Do not leave any items on the stairs. Make sure that there are handrails on both sides of the stairs and use them. Fix handrails that are broken or loose. Make sure that handrails are as long as the stairways. Check any carpeting to make sure that it is firmly attached to the stairs. Fix any carpet that is loose or worn. Avoid having throw rugs at the top or bottom of the stairs. If you do have throw rugs, attach them to the floor with carpet tape. Make sure that you have a light switch at the top of the stairs and the bottom of the stairs.  If you do not have them, ask someone to add them for you. What else can I do to help prevent falls? Wear shoes that: Do not have high heels. Have rubber bottoms. Are comfortable and fit you well. Are closed at the toe. Do not wear sandals. If you use a stepladder: Make sure that it is fully opened. Do not climb a closed stepladder. Make sure that both sides of the stepladder are locked into place. Ask someone to hold it for you, if possible. Clearly mark and make sure that you can see: Any grab bars or handrails. First and last steps. Where the edge of each step is. Use tools that help you move around (mobility aids) if they are needed. These include: Canes. Walkers. Scooters. Crutches. Turn on the lights when you go  into a dark area. Replace any light bulbs as soon as they burn out. Set up your furniture so you have a clear path. Avoid moving your furniture around. If any of your floors are uneven, fix them. If there are any pets around you, be aware of where they are. Review your medicines with your doctor. Some medicines can make you feel dizzy. This can increase your chance of falling. Ask your doctor what other things that you can do to help prevent falls. This information is not intended to replace advice given to you by your health care provider. Make sure you discuss any questions you have with your health care provider. Document Released: 07/07/2009 Document Revised: 02/16/2016 Document Reviewed: 10/15/2014 Elsevier Interactive Patient Education  2017 [employer].

## 2024-01-03 NOTE — Progress Notes (Deleted)
 S:     No chief complaint on file.  68 y.o. male who presents for hypertension evaluation, education, and management.  PMH is significant for HTN, HLD, obesity.  Patient was referred and last seen by Primary Care Provider, Gwinda Passe, on 07/25/2023. BP at that visit was 143/91 mmHg. At pharmacy appt on 10/01/23, BP was elevated to 147/73 - he was instructed to increase losartan to 100 mg daily. At pharmacy follow-up on 11/04/23 BP was at goal and repeat BMP was stable.   Today, patient arrives in good spirits and presents without assistance. ***Denies dizziness, blurred vision, swelling. He endorses occasional migraine headaches which he takes BC powder and tylenol for. Rarely takes ibuprofen. He reports that he finished his losartan 50 mg tabs by taking two at a time, and has since started taking losartan 100 mg daily. He does not have a home BP cuff.   Home readings?  Increase statin? BC powders?  Family/Social history:  -Fhx: no pertinent positives -Tobacco: former smoker - quit in 2000. -Alcohol: none reported   Medication adherence reported. Patient has taken BP medications today.   Current antihypertensives include: losartan 100 mg daily, hydrochlorothiazide 25 mg daily Current hyperlipidemia medications: atorvastatin 20 mg PO daily   Reported home BP readings: none  Patient reported dietary habits:  -Endorses compliance with sodium restriction -Denies any excessive intake of caffeine  - coffee in the morning  Patient-reported exercise habits:  -None  O:  There were no vitals filed for this visit.   There is no height or weight on file to calculate BMI.   Last 3 Office BP readings: BP Readings from Last 3 Encounters:  11/06/23 (!) 145/84  11/04/23 113/69  10/01/23 (!) 147/73    BMET    Component Value Date/Time   NA 137 11/04/2023 1623   K 4.2 11/04/2023 1623   CL 101 11/04/2023 1623   CO2 23 11/04/2023 1623   GLUCOSE 82 11/04/2023 1623   GLUCOSE  118 (H) 06/14/2020 1056   BUN 14 11/04/2023 1623   CREATININE 1.14 11/04/2023 1623   CREATININE 0.91 07/27/2013 1140   CALCIUM 9.0 11/04/2023 1623   GFRNONAA >60 06/14/2020 1056   GFRAA >60 06/14/2020 1056    Lipid Panel     Component Value Date/Time   CHOL 159 11/04/2023 1623   TRIG 100 11/04/2023 1623   HDL 54 11/04/2023 1623   CHOLHDL 2.9 11/04/2023 1623   CHOLHDL 4.1 07/27/2013 1140   VLDL 21 07/27/2013 1140   LDLCALC 87 11/04/2023 1623   LABVLDL 18 11/04/2023 1623    Renal function: CrCl cannot be calculated (Patient's most recent lab result is older than the maximum 21 days allowed.).  Clinical ASCVD: No  The 10-year ASCVD risk score (Arnett DK, et al., 2019) is: 19%   Values used to calculate the score:     Age: 29 years     Sex: Male     Is Non-Hispanic African American: Yes     Diabetic: No     Tobacco smoker: No     Systolic Blood Pressure: 145 mmHg     Is BP treated: Yes     HDL Cholesterol: 54 mg/dL     Total Cholesterol: 159 mg/dL  Patient is participating in a Managed Medicaid Plan: No   A/P: Hypertension diagnosed currently controlled below goal on current medications. BP goal < 130/80 mmHg. Medication adherence appears optimal. Obtaining BMP today to monitor potassium and kidney function after increasing losartan. If  stable, appropriate to continue current regimen. Noted that patient has not had repeat lipid panel since starting atorvastatin several years ago - will evaluate today to guided management of ASCVD risk.  -Continued hydrochlorothiazide 25 mg daily.  -Continued losartan 100 mg daily.  -Patient educated on purpose, proper use, and potential adverse effects of losartan and hydrochlorothiazide.  -F/u labs ordered - BMP, lipid panel  -Counseled on lifestyle modifications for blood pressure control including reduced dietary sodium, increased exercise, adequate sleep. -Encouraged patient to check BP at home and bring log of readings to next visit.  Counseled on proper use of home BP cuff. Advised patient on where he may buy a BP cuff. -Advised patient against use of BC powder to reduce risk of bleeding. Encourage use of tylenol instad.   Results reviewed and written information provided.    Written patient instructions provided. Patient verbalized understanding of treatment plan.  Total time in face to face counseling 25 minutes.    Follow-up:  Pharmacist in 2 months.  PCP Needs to be scheduled - missed appt 11/27/23  Nils Pyle, PharmD PGY1 Pharmacy Resident  Butch Penny, PharmD, BCACP, CPP Clinical Pharmacist [facility] [phone]

## 2024-01-06 ENCOUNTER — Ambulatory Visit: Payer: Medicare Other | Admitting: Pharmacist

## 2024-03-31 ENCOUNTER — Ambulatory Visit (INDEPENDENT_AMBULATORY_CARE_PROVIDER_SITE_OTHER): Admitting: Primary Care

## 2024-03-31 ENCOUNTER — Ambulatory Visit: Admitting: Pharmacist

## 2024-03-31 ENCOUNTER — Encounter (INDEPENDENT_AMBULATORY_CARE_PROVIDER_SITE_OTHER): Payer: Self-pay | Admitting: Primary Care

## 2024-03-31 VITALS — BP 100/64 | HR 81 | Resp 16 | Ht 73.0 in | Wt 246.6 lb

## 2024-03-31 DIAGNOSIS — H538 Other visual disturbances: Secondary | ICD-10-CM | POA: Diagnosis not present

## 2024-03-31 DIAGNOSIS — E782 Mixed hyperlipidemia: Secondary | ICD-10-CM | POA: Diagnosis not present

## 2024-03-31 DIAGNOSIS — Z1211 Encounter for screening for malignant neoplasm of colon: Secondary | ICD-10-CM | POA: Diagnosis not present

## 2024-03-31 DIAGNOSIS — I10 Essential (primary) hypertension: Secondary | ICD-10-CM | POA: Diagnosis not present

## 2024-03-31 NOTE — Progress Notes (Signed)
 [facility]  Randall Cook, is a 68 y.o. male  RDW:[id number]  FMW:[id number]  DOB - [birth year]  Chief Complaint  Patient presents with   Hypertension   Hyperlipidemia       Subjective:   Randall Cook is a 68 y.o. male here today for a follow up visit for the management of hypertension. Patient has No headache, No chest pain, No abdominal pain - No Nausea, No new weakness tingling or numbness, No Cough - shortness of breath.  Today blood pressure reading is 100/64 previously in February blood pressure was 145/84.     No problems updated.  Comprehensive ROS Pertinent positive and negative noted in HPI   Allergies  Allergen Reactions   Penicillins Rash    Has patient had a PCN reaction causing immediate rash, facial/tongue/throat swelling, SOB or lightheadedness with hypotension: Unknown Has patient had a PCN reaction causing severe rash involving mucus membranes or skin necrosis: No  Has patient had a PCN reaction that required hospitalization: No  Has patient had a PCN reaction occurring within the last 10 years: Yes  If all of the above answers are NO, then may proceed with Cephalosporin use.     Past Medical History:  Diagnosis Date   Arthritis    Dysrhythmia    tachycardia- EKG,clearance 02/29/12 Dr Seabron  on chart   GERD (gastroesophageal reflux disease)    Hyperlipidemia    Hypertension    LOV,   Dr Seabron 02/29/12 on chart-   CBC with diff, CMET 02/29/12 on chart   Sleep apnea    STOP BANG SCORE 4    Current Outpatient Medications on File Prior to Visit  Medication Sig Dispense Refill   atorvastatin  (LIPITOR) 20 MG tablet Take 1 tablet (20 mg total) by mouth daily. 90 tablet 0   Baclofen  5 MG TABS Take 1 tablet (5 mg total) by mouth 2 (two) times daily as needed. 20 tablet 0   diclofenac  Sodium (VOLTAREN ) 1 % GEL Apply 4 g topically 4 (four) times daily. Apply to affected areas 4 times daily as needed for pain. 100 g 2   hydrochlorothiazide   (HYDRODIURIL ) 25 MG tablet Take 1 tablet daily in the morning 90 tablet 0   losartan  (COZAAR ) 100 MG tablet Take 1 tablet (100 mg total) by mouth daily. 90 tablet 1   No current facility-administered medications on file prior to visit.   Health Maintenance  Topic Date Due   Colon Cancer Screening  Never done   Pneumococcal Vaccine for age over 66 (1 of 1 - PCV) Never done   Zoster (Shingles) Vaccine (1 of 2) Never done   COVID-19 Vaccine (4 - 2024-25 season) 05/26/2023   Flu Shot  04/24/2024   Medicare Annual Wellness Visit  12/17/2024   DTaP/Tdap/Td vaccine (2 - Td or Tdap) 01/12/2031   Hepatitis C Screening  Completed   Hepatitis B Vaccine  Aged Out   HPV Vaccine  Aged Out   Meningitis B Vaccine  Aged Out    Objective:   Vitals:   03/31/24 1557  BP: 100/64  Pulse: 81  Resp: 16  SpO2: 95%  Weight: 246 lb 9.6 oz (111.9 kg)  Height: 6' 1 (1.854 m)   BP Readings from Last 3 Encounters:  03/31/24 100/64  11/06/23 (!) 145/84  11/04/23 113/69      Physical Exam Vitals reviewed.  Constitutional:      Appearance: He is obese.  HENT:     Head: Normocephalic.  Right Ear: Tympanic membrane, ear canal and external ear normal.     Left Ear: Tympanic membrane, ear canal and external ear normal.     Nose: Nose normal.  Eyes:     Extraocular Movements: Extraocular movements intact.     Pupils: Pupils are equal, round, and reactive to light.  Cardiovascular:     Rate and Rhythm: Normal rate and regular rhythm.  Pulmonary:     Effort: Pulmonary effort is normal.     Breath sounds: Normal breath sounds.  Abdominal:     General: Bowel sounds are normal. There is distension.     Palpations: Abdomen is soft.  Musculoskeletal:        General: Normal range of motion.  Skin:    General: Skin is warm and dry.  Neurological:     Mental Status: He is oriented to person, place, and time.  Psychiatric:        Mood and Affect: Mood normal.        Behavior: Behavior normal.         Thought Content: Thought content normal.        Judgment: Judgment normal.    Assessment & Plan  Terique was seen today for hypertension and hyperlipidemia.  Diagnoses and all orders for this visit:  Screening for colon cancer -     Cologuard  Mixed hyperlipidemia -     Lipid panel  Hypertension, unspecified type Will hold HCTZ 25 mg half patient to return for blood pressure check only. -     CBC with Differential/Platelet -     CMP14+EGFR  Vision blurred -     Ambulatory referral to Ophthalmology As completing visit states vision years   Patient have been counseled extensively about nutrition and exercise. Other issues discussed during this visit include: low cholesterol diet, weight control and daily exercise, foot care, annual eye examinations at Ophthalmology, importance of adherence with medications and regular follow-up. We also discussed long term complications of uncontrolled diabetes and hypertension.   2 weeks BP  The patient was given clear instructions to go to ER or return to medical center if symptoms don't improve, worsen or new problems develop. The patient verbalized understanding. The patient was told to call to get lab results if they haven't heard anything in the next week.   This note has been created with Education officer, environmental. Any transcriptional errors are unintentional.   Randall SHAUNNA Bohr, NP 03/31/2024, 4:21 PM

## 2024-03-31 NOTE — Patient Instructions (Signed)
 HOLD hydrochlorothiazide  25 mg until blood pressure check in 2 weeks

## 2024-04-01 LAB — CBC WITH DIFFERENTIAL/PLATELET
Basophils Absolute: 0.1 x10E3/uL (ref 0.0–0.2)
Basos: 1 %
EOS (ABSOLUTE): 0 x10E3/uL (ref 0.0–0.4)
Eos: 0 %
Hematocrit: 40.9 % (ref 37.5–51.0)
Hemoglobin: 13.8 g/dL (ref 13.0–17.7)
Immature Grans (Abs): 0 x10E3/uL (ref 0.0–0.1)
Immature Granulocytes: 0 %
Lymphocytes Absolute: 1.9 x10E3/uL (ref 0.7–3.1)
Lymphs: 26 %
MCH: 32 pg (ref 26.6–33.0)
MCHC: 33.7 g/dL (ref 31.5–35.7)
MCV: 95 fL (ref 79–97)
Monocytes Absolute: 0.6 x10E3/uL (ref 0.1–0.9)
Monocytes: 8 %
Neutrophils Absolute: 4.7 x10E3/uL (ref 1.4–7.0)
Neutrophils: 65 %
Platelets: 238 x10E3/uL (ref 150–450)
RBC: 4.31 x10E6/uL (ref 4.14–5.80)
RDW: 12.4 % (ref 11.6–15.4)
WBC: 7.2 x10E3/uL (ref 3.4–10.8)

## 2024-04-01 LAB — LIPID PANEL
Chol/HDL Ratio: 2.6 ratio (ref 0.0–5.0)
Cholesterol, Total: 153 mg/dL (ref 100–199)
HDL: 59 mg/dL (ref >39)
LDL Chol Calc (NIH): 74 mg/dL (ref 0–99)
Triglycerides: 109 mg/dL (ref 0–149)
VLDL Cholesterol Cal: 20 mg/dL (ref 5–40)

## 2024-04-01 LAB — CMP14+EGFR
ALT: 19 IU/L (ref 0–44)
AST: 22 IU/L (ref 0–40)
Albumin: 4 g/dL (ref 3.9–4.9)
Alkaline Phosphatase: 80 IU/L (ref 44–121)
BUN/Creatinine Ratio: 7 — ABNORMAL LOW (ref 10–24)
BUN: 8 mg/dL (ref 8–27)
Bilirubin Total: 0.3 mg/dL (ref 0.0–1.2)
CO2: 19 mmol/L — ABNORMAL LOW (ref 20–29)
Calcium: 8.9 mg/dL (ref 8.6–10.2)
Chloride: 104 mmol/L (ref 96–106)
Creatinine, Ser: 1.08 mg/dL (ref 0.76–1.27)
Globulin, Total: 2.6 g/dL (ref 1.5–4.5)
Glucose: 106 mg/dL — ABNORMAL HIGH (ref 70–99)
Potassium: 3.8 mmol/L (ref 3.5–5.2)
Sodium: 140 mmol/L (ref 134–144)
Total Protein: 6.6 g/dL (ref 6.0–8.5)
eGFR: 75 mL/min/1.73 (ref >59)

## 2024-04-02 ENCOUNTER — Ambulatory Visit: Payer: Self-pay | Admitting: Primary Care

## 2024-04-11 DIAGNOSIS — Z1211 Encounter for screening for malignant neoplasm of colon: Secondary | ICD-10-CM | POA: Diagnosis not present

## 2024-04-15 ENCOUNTER — Telehealth (INDEPENDENT_AMBULATORY_CARE_PROVIDER_SITE_OTHER): Payer: Self-pay | Admitting: Primary Care

## 2024-04-15 NOTE — Telephone Encounter (Signed)
 Called pt to confirm appt. Pt will be present.

## 2024-04-16 ENCOUNTER — Telehealth (INDEPENDENT_AMBULATORY_CARE_PROVIDER_SITE_OTHER): Payer: Self-pay | Admitting: *Deleted

## 2024-04-16 ENCOUNTER — Ambulatory Visit (INDEPENDENT_AMBULATORY_CARE_PROVIDER_SITE_OTHER)

## 2024-04-16 DIAGNOSIS — E782 Mixed hyperlipidemia: Secondary | ICD-10-CM

## 2024-04-16 DIAGNOSIS — Z760 Encounter for issue of repeat prescription: Secondary | ICD-10-CM

## 2024-04-16 DIAGNOSIS — I10 Essential (primary) hypertension: Secondary | ICD-10-CM

## 2024-04-16 NOTE — Progress Notes (Signed)
   Blood Pressure Recheck Visit  Name: Randall Cook MRN: [id number] Date of Birth: 23-Apr-1956  Randall Cook presents today for Blood Pressure recheck with clinical support staff.  Order for BP recheck by Jesslyn Katz ordered on 03/31/2024  BP Readings from Last 3 Encounters:  04/16/24 128/77  03/31/24 100/64  11/06/23 (!) 145/84    Current Outpatient Medications  Medication Sig Dispense Refill   atorvastatin  (LIPITOR) 20 MG tablet Take 1 tablet (20 mg total) by mouth daily. 90 tablet 0   hydrochlorothiazide  (HYDRODIURIL ) 25 MG tablet Take 1 tablet daily in the morning 90 tablet 0   Baclofen  5 MG TABS Take 1 tablet (5 mg total) by mouth 2 (two) times daily as needed. 20 tablet 0   diclofenac  Sodium (VOLTAREN ) 1 % GEL Apply 4 g topically 4 (four) times daily. Apply to affected areas 4 times daily as needed for pain. 100 g 2   losartan  (COZAAR ) 100 MG tablet Take 1 tablet (100 mg total) by mouth daily. (Patient not taking: Reported on 04/16/2024) 90 tablet 1   No current facility-administered medications for this visit.    Hypertensive Medication Review: Patient states that they are taking all their hypertensive medications as prescribed and their last dose of hypertensive medications was this morning   Documentation of any medication adherence discrepancies: none  Provider Recommendation:  Spoke to Mrs. Edwards and they stated: patient has an appointment on 04/28/2024 at 4:10 to address shoulder and ankle pain.   Patient has been scheduled to follow up with Rosaline Bohr, NP  Patient has been given provider's recommendations and does not have any questions or concerns at this time. Patient will contact the office for any future questions or concerns.

## 2024-04-16 NOTE — Telephone Encounter (Signed)
 Patient wanted a refill on baclofen  when he came in for BP recheck today.  Medication not prescribed by PCP.   Please advise on next steps for BP and refill.

## 2024-04-17 ENCOUNTER — Other Ambulatory Visit: Payer: Self-pay | Admitting: Primary Care

## 2024-04-17 MED ORDER — BACLOFEN 5 MG PO TABS: 5.0000 mg | ORAL_TABLET | Freq: Two times a day (BID) | ORAL | 0 refills | Status: DC | PRN

## 2024-04-18 LAB — COLOGUARD: COLOGUARD: NEGATIVE

## 2024-04-28 ENCOUNTER — Ambulatory Visit (INDEPENDENT_AMBULATORY_CARE_PROVIDER_SITE_OTHER): Admitting: Primary Care

## 2024-04-28 ENCOUNTER — Encounter (INDEPENDENT_AMBULATORY_CARE_PROVIDER_SITE_OTHER): Payer: Self-pay | Admitting: Primary Care

## 2024-04-28 VITALS — BP 134/98 | HR 88 | Resp 16 | Wt 251.0 lb

## 2024-04-28 DIAGNOSIS — Z760 Encounter for issue of repeat prescription: Secondary | ICD-10-CM | POA: Diagnosis not present

## 2024-04-28 DIAGNOSIS — M797 Fibromyalgia: Secondary | ICD-10-CM

## 2024-04-28 DIAGNOSIS — I10 Essential (primary) hypertension: Secondary | ICD-10-CM

## 2024-04-28 MED ORDER — BACLOFEN 10 MG PO TABS: 10.0000 mg | ORAL_TABLET | Freq: Three times a day (TID) | ORAL | 0 refills | Status: AC

## 2024-04-28 MED ORDER — HYDROCHLOROTHIAZIDE 25 MG PO TABS: ORAL_TABLET | ORAL | 1 refills | Status: AC

## 2024-04-28 NOTE — Progress Notes (Signed)
 [facility]  Randall Cook, is a 68 y.o. male  RDW:[id number]  FMW:[id number]  DOB - December 30, 1955  Chief Complaint  Patient presents with   Shoulder Pain    B/l   Joint Swelling    Left ankle        Subjective:   Randall Cook is a 68 y.o. male here today for an acute visit. Increase pain aches at night or Shoulder Pain,  Joint Swelling and feet swelling after working all day. Drives a fork lift ,load and unload .Repetive motion   Shoulder Pain     No problems updated.  Comprehensive ROS Pertinent positive and negative noted in HPI   Allergies  Allergen Reactions   Penicillins Rash    Has patient had a PCN reaction causing immediate rash, facial/tongue/throat swelling, SOB or lightheadedness with hypotension: Unknown Has patient had a PCN reaction causing severe rash involving mucus membranes or skin necrosis: No  Has patient had a PCN reaction that required hospitalization: No  Has patient had a PCN reaction occurring within the last 10 years: Yes  If all of the above answers are NO, then may proceed with Cephalosporin use.     Past Medical History:  Diagnosis Date   Arthritis    Dysrhythmia    tachycardia- EKG,clearance 02/29/12 Dr Seabron  on chart   GERD (gastroesophageal reflux disease)    Hyperlipidemia    Hypertension    LOV,   Dr Seabron 02/29/12 on chart-   CBC with diff, CMET 02/29/12 on chart   Sleep apnea    STOP BANG SCORE 4    Current Outpatient Medications on File Prior to Visit  Medication Sig Dispense Refill   atorvastatin  (LIPITOR) 20 MG tablet Take 1 tablet (20 mg total) by mouth daily. 90 tablet 0   diclofenac  Sodium (VOLTAREN ) 1 % GEL Apply 4 g topically 4 (four) times daily. Apply to affected areas 4 times daily as needed for pain. 100 g 2   losartan  (COZAAR ) 100 MG tablet Take 1 tablet (100 mg total) by mouth daily. (Patient not taking: Reported on 04/16/2024) 90 tablet 1   No current facility-administered medications on file  prior to visit.   Health Maintenance  Topic Date Due   Colon Cancer Screening  Never done   Pneumococcal Vaccine for age over 55 (1 of 1 - PCV) Never done   Zoster (Shingles) Vaccine (1 of 2) Never done   COVID-19 Vaccine (4 - 2024-25 season) 05/26/2023   Flu Shot  04/24/2024   Medicare Annual Wellness Visit  12/17/2024   DTaP/Tdap/Td vaccine (2 - Td or Tdap) 01/12/2031   Hepatitis C Screening  Completed   Hepatitis B Vaccine  Aged Out   HPV Vaccine  Aged Out   Meningitis B Vaccine  Aged Out    Objective:   Vitals:   04/28/24 1654  BP: (!) 134/98  Pulse: 88  Resp: 16  SpO2: 98%  Weight: 251 lb (113.9 kg)   BP Readings from Last 3 Encounters:  04/28/24 (!) 134/98  04/16/24 128/77  03/31/24 100/64      Physical Exam Vitals reviewed.  Constitutional:      Appearance: He is obese.  HENT:     Right Ear: External ear normal.     Left Ear: External ear normal.     Nose: Nose normal.  Eyes:     Extraocular Movements: Extraocular movements intact.  Cardiovascular:     Rate and Rhythm: Normal rate and regular rhythm.  Pulmonary:     Effort: Pulmonary effort is normal.     Breath sounds: Normal breath sounds.  Abdominal:     General: Bowel sounds are normal. There is distension.     Palpations: Abdomen is soft.  Musculoskeletal:        General: Tenderness present.     Cervical back: Normal range of motion and neck supple.  Skin:    General: Skin is warm.  Neurological:     Mental Status: He is oriented to person, place, and time.  Psychiatric:        Mood and Affect: Mood normal.        Behavior: Behavior normal.     Assessment & Plan  Randall Cook was seen today for shoulder pain and joint swelling.  Diagnoses and all orders for this visit:  Muscle pain, fibromyalgia See HPI taught proper body mechanics -     baclofen  (LIORESAL ) 10 MG tablet; Take 1 tablet (10 mg total) by mouth 3 (three) times daily.  Medication refill -     hydrochlorothiazide   (HYDRODIURIL ) 25 MG tablet; Take 1 tablet daily in the morning  Hypertension, unspecified type BP goal - < 140/90Explained that having normal blood pressure is the goal and medications are helping to get to goal and maintain normal blood pressure. DIET: Limit salt intake, read nutrition labels to check salt content, limit fried and high fatty foods  Avoid using multisymptom OTC cold preparations that generally contain sudafed which can rise BP. Consult with pharmacist on best cold relief products to use for persons with HTN EXERCISE Discussed incorporating exercise such as walking - 30 minutes most days of the week and can do in 10 minute intervals    -     hydrochlorothiazide  (HYDRODIURIL ) 25 MG tablet; Take 1 tablet daily in the morning     Patient have been counseled extensively about nutrition and exercise. Other issues discussed during this visit include: low cholesterol diet, weight control and daily exercise, foot care, annual eye examinations at Ophthalmology, importance of adherence with medications and regular follow-up. We also discussed long term complications of uncontrolled diabetes and hypertension.   Return for re-check blood pressure.  The patient was given clear instructions to go to ER or return to medical center if symptoms don't improve, worsen or new problems develop. The patient verbalized understanding. The patient was told to call to get lab results if they haven't heard anything in the next week.   This note has been created with Education officer, environmental. Any transcriptional errors are unintentional.   Randall SHAUNNA Bohr, NP 04/28/2024, 5:17 PM

## 2024-06-19 ENCOUNTER — Ambulatory Visit (INDEPENDENT_AMBULATORY_CARE_PROVIDER_SITE_OTHER)

## 2024-06-19 ENCOUNTER — Ambulatory Visit (HOSPITAL_COMMUNITY): Payer: Self-pay

## 2024-06-19 ENCOUNTER — Ambulatory Visit (HOSPITAL_COMMUNITY)
Admission: EM | Admit: 2024-06-19 | Discharge: 2024-06-19 | Disposition: A | Discharge status: Home / Self Care | Attending: Family Medicine | Admitting: Family Medicine

## 2024-06-19 ENCOUNTER — Encounter (HOSPITAL_COMMUNITY): Payer: Self-pay

## 2024-06-19 DIAGNOSIS — M19041 Primary osteoarthritis, right hand: Secondary | ICD-10-CM | POA: Diagnosis not present

## 2024-06-19 DIAGNOSIS — M79644 Pain in right finger(s): Secondary | ICD-10-CM

## 2024-06-19 DIAGNOSIS — L03011 Cellulitis of right finger: Secondary | ICD-10-CM | POA: Diagnosis not present

## 2024-06-19 DIAGNOSIS — M7989 Other specified soft tissue disorders: Secondary | ICD-10-CM | POA: Diagnosis not present

## 2024-06-19 MED ORDER — CLINDAMYCIN HCL 300 MG PO CAPS: 300.0000 mg | ORAL_CAPSULE | Freq: Three times a day (TID) | ORAL | 0 refills | Status: DC

## 2024-06-19 NOTE — ED Triage Notes (Signed)
 Patient reports that he began having swelling and pain to the right pointer finger since yesterday. Paatient denies any injury.  Patient states he has been using ice and alcohol and H2O2 soaks with no relief.

## 2024-06-19 NOTE — ED Provider Notes (Signed)
 [facility]    CSN: [id number] Arrival date & time: 06/19/24  0805      History   Chief Complaint Chief Complaint  Patient presents with   finger swelling    HPI Randall Cook is a 68 y.o. male.   Patient is here for pain and swelling to the right index finger.  Yesterday started with pain to the end of the finger, around the nail.  This has worsened and woke up from sleep.  Like a tooth ache.  He does not recall any injury.  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  No fevers/chills.  No n/v.        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 Authorizing Provider  atorvastatin  (LIPITOR) 20 MG tablet Take 1 tablet (20 mg total) by mouth daily. 12/04/23   Celestia Rosaline SQUIBB, NP  baclofen  (LIORESAL ) 10 MG tablet Take 1 tablet (10 mg total) by mouth 3 (three) times daily. 04/28/24   Celestia Rosaline SQUIBB, NP  diclofenac  Sodium (VOLTAREN ) 1 % GEL Apply 4 g topically 4 (four) times daily. Apply to affected areas 4 times daily as needed for pain. 07/25/23   Celestia Rosaline SQUIBB, NP   hydrochlorothiazide  (HYDRODIURIL ) 25 MG tablet Take 1 tablet daily in the morning 04/28/24   Celestia Rosaline SQUIBB, NP  losartan  (COZAAR ) 100 MG tablet Take 1 tablet (100 mg total) by mouth daily. Patient not taking: Reported on 04/16/2024 10/01/23   Newlin, Enobong, MD    Family History History reviewed. No pertinent family history.  Social History Social History   Tobacco Use   Smoking status: Former    Current packs/day: 0.00    Average packs/day: 0.5 packs/day for 20.0 years (10.0 ttl pk-yrs)    Types: Cigarettes    Start date: 03/05/1979    Quit date: 03/05/1999    Years since quitting: 25.3   Smokeless tobacco: Never  Vaping Use   Vaping status: Never Used  Substance Use Topics   Alcohol use: Not Currently    Comment: none x 20 yrs   Drug use: No     Allergies   Penicillins   Review of Systems Review of Systems  Constitutional: Negative.   HENT: Negative.    Respiratory: Negative.    Cardiovascular: Negative.   Gastrointestinal: Negative.   Musculoskeletal:  Positive for arthralgias.     Physical Exam Triage Vital Signs ED Triage Vitals [06/19/24 0827]  Encounter Vitals  Group     BP 117/76     Girls Systolic BP Percentile      Girls Diastolic BP Percentile      Boys Systolic BP Percentile      Boys Diastolic BP Percentile      Pulse Rate 71     Resp 16     Temp 98 F (36.7 C)     Temp Source Oral     SpO2 94 %     Weight      Height      Head Circumference      Peak Flow      Pain Score 9     Pain Loc      Pain Education      Exclude from Growth Chart    No data found.  Updated Vital Signs BP 117/76 (BP Location: Left Arm)   Pulse 71   Temp 98 F (36.7 C) (Oral)   Resp 16   SpO2 94%   Visual Acuity Right Eye Distance:   Left Eye Distance:   Bilateral Distance:    Right Eye Near:   Left Eye Near:    Bilateral Near:     Physical Exam Constitutional:      Appearance: Normal appearance. He is normal weight.  Skin:    Comments:  There is slight swelling around the base of the nail to the right index finger;  no redness/warmth noted; no changes to the skin noted;  this area is tender  Neurological:     General: No focal deficit present.     Mental Status: He is alert.  Psychiatric:        Mood and Affect: Mood normal.      UC Treatments / Results  Labs (all labs ordered are listed, but only abnormal results are displayed) Labs Reviewed - No data to display  EKG   Radiology No results found.  Procedures Procedures (including critical care time)  Medications Ordered in UC Medications - No data to display  Initial Impression / Assessment and Plan / UC Course  I have reviewed the triage vital signs and the nursing notes.  Pertinent labs & imaging results that were available during my care of the patient were reviewed by me and considered in my medical decision making (see chart for details).  Final Clinical Impressions(s) / UC Diagnoses   Final diagnoses:  Pain of finger of right hand  Paronychia of finger of right hand     Discharge Instructions      You were seen today for finger pain.  Your xray appears normal today.  I think there is an infection around the nail bed causing pain.  I have sent out an oral antibiotic to take three times/day x 7 days.  I also recommend warm epson salt soaks to the finger.   Please return, or go to the ER if not improving in the next 24-48 hrs.     ED Prescriptions     Medication Sig Dispense Auth. Provider   clindamycin  (CLEOCIN ) 300 MG capsule Take 1 capsule (300 mg total) by mouth 3 (three) times daily for 7 days. 21 capsule Darral Longs, MD      PDMP not reviewed this encounter.   Darral Longs, MD 06/19/24 [phone]

## 2024-06-19 NOTE — Discharge Instructions (Signed)
 You were seen today for finger pain.  Your xray appears normal today.  I think there is an infection around the nail bed causing pain.  I have sent out an oral antibiotic to take three times/day x 7 days.  I also recommend warm epson salt soaks to the finger.   Please return, or go to the ER if not improving in the next 24-48 hrs.

## 2024-06-26 ENCOUNTER — Ambulatory Visit (HOSPITAL_COMMUNITY)
Admission: EM | Admit: 2024-06-26 | Discharge: 2024-06-26 | Disposition: A | Discharge status: Home / Self Care | Attending: Emergency Medicine | Admitting: Emergency Medicine

## 2024-06-26 ENCOUNTER — Encounter (HOSPITAL_COMMUNITY): Payer: Self-pay

## 2024-06-26 DIAGNOSIS — L03011 Cellulitis of right finger: Secondary | ICD-10-CM

## 2024-06-26 MED ORDER — MUPIROCIN 2 % EX OINT: 1.0000 | TOPICAL_OINTMENT | Freq: Two times a day (BID) | CUTANEOUS | 0 refills | Status: AC

## 2024-06-26 MED ORDER — DOXYCYCLINE HYCLATE 100 MG PO CAPS: 100.0000 mg | ORAL_CAPSULE | Freq: Two times a day (BID) | ORAL | 0 refills | Status: AC

## 2024-06-26 NOTE — ED Provider Notes (Signed)
 [facility]    CSN: [id number] Arrival date & time: 06/26/24  1556      History   Chief Complaint Chief Complaint  Patient presents with   Finger Injury   Wound Check    HPI Randall Cook is a 68 y.o. male.  Returns with concerns for continued infection of right index finger.  He was seen 1 week ago on 9/26. Had negative xray of finger, treated for paronychia with clindamycin  TID x 7 days. He started it the day after his visit, and reports has 1 full day left.   It did drain some over the week. It's not worse, but he feels its not improving. No fevers   Reports he has been cleaning it frequently with peroxide   Past Medical History:  Diagnosis Date   Arthritis    Dysrhythmia    tachycardia- EKG,clearance 02/29/12 Dr Seabron  on chart   GERD (gastroesophageal reflux disease)    Hyperlipidemia    Hypertension    LOV,   Dr Seabron 02/29/12 on chart-   CBC with diff, CMET 02/29/12 on chart   Sleep apnea    STOP BANG SCORE 4    Patient Active Problem List   Diagnosis Date Noted   Obesity (BMI 30.0-34.9) 10/02/2019   Acute hypoxemic respiratory failure due to COVID-19 [facility]) 09/27/2019   Hypertension 01/07/2018   Hyperlipidemia 04/09/2017   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 Authorizing Provider  atorvastatin  (LIPITOR) 20 MG tablet Take 1 tablet (20 mg total) by mouth daily. 12/04/23  Yes Celestia Rosaline SQUIBB, NP  doxycycline (VIBRAMYCIN) 100 MG capsule Take 1 capsule (100 mg total) by mouth 2 (two) times daily for 7 days. 06/26/24 07/03/24 Yes Lakasha Mcfall, Asberry, PA-C  hydrochlorothiazide  (HYDRODIURIL ) 25 MG tablet Take 1 tablet daily in  the morning 04/28/24  Yes Celestia Rosaline SQUIBB, NP  losartan  (COZAAR ) 100 MG tablet Take 1 tablet (100 mg total) by mouth daily. 10/01/23  Yes Newlin, Enobong, MD  mupirocin ointment (BACTROBAN) 2 % Apply 1 Application topically 2 (two) times daily. 06/26/24  Yes Lyndsie Wallman, Asberry, PA-C  baclofen  (LIORESAL ) 10 MG tablet Take 1 tablet (10 mg total) by mouth 3 (three) times daily. 04/28/24   Celestia Rosaline SQUIBB, NP  diclofenac  Sodium (VOLTAREN ) 1 % GEL Apply 4 g topically 4 (four) times daily. Apply to affected areas 4 times daily as needed for pain. 07/25/23   Celestia Rosaline SQUIBB, NP    Family History History reviewed. No pertinent family history.  Social History Social History   Tobacco Use   Smoking status: Former    Current packs/day: 0.00    Average packs/day: 0.5 packs/day for 20.0 years (10.0 ttl pk-yrs)    Types: Cigarettes    Start date: 03/05/1979    Quit date: 03/05/1999    Years since quitting: 25.3   Smokeless tobacco: Never  Vaping Use   Vaping status: Never Used  Substance Use Topics   Alcohol use: Not Currently    Comment: none x 20 yrs   Drug use: No     Allergies   Penicillins   Review of  Systems Review of Systems  As per HPI  Physical Exam Triage Vital Signs ED Triage Vitals  Encounter Vitals Group     BP 06/26/24 1608 130/85     Girls Systolic BP Percentile --      Girls Diastolic BP Percentile --      Boys Systolic BP Percentile --      Boys Diastolic BP Percentile --      Pulse Rate 06/26/24 1608 86     Resp 06/26/24 1608 18     Temp 06/26/24 1608 98.1 F (36.7 C)     Temp Source 06/26/24 1608 Oral     SpO2 06/26/24 1608 91 %     Weight --      Height --      Head Circumference --      Peak Flow --      Pain Score 06/26/24 1607 8     Pain Loc --      Pain Education --      Exclude from Growth Chart --    No data found.  Updated Vital Signs BP 130/85 (BP Location: Right Arm)   Pulse 86   Temp 98.1 F (36.7 C) (Oral)   Resp 18   SpO2 91%    Physical Exam Vitals and nursing note reviewed.  Constitutional:      General: He is not in acute distress.    Appearance: He is not ill-appearing.  HENT:     Mouth/Throat:     Pharynx: Oropharynx is clear.  Cardiovascular:     Rate and Rhythm: Normal rate and regular rhythm.     Pulses: Normal pulses.  Pulmonary:     Effort: Pulmonary effort is normal.  Musculoskeletal:       Hands:     Comments: Mild swelling at right hand index finger base of nail. There is some fluctuance. No erythema, drainage, bleeding. Tender only at area of fluctuance. Pain does not extend around the finger pad. Good ROM and distal sensation intact. Cap refill < 2 seconds   Skin:    General: Skin is warm and dry.     Capillary Refill: Capillary refill takes less than 2 seconds.  Neurological:     Mental Status: He is alert and oriented to person, place, and time.     UC Treatments / Results  Labs (all labs ordered are listed, but only abnormal results are displayed) Labs Reviewed - No data to display  EKG  Radiology No results found.  Procedures Procedures   Medications Ordered in UC Medications - No data to display  Initial Impression / Assessment and Plan / UC Course  I have reviewed the triage vital signs and the nursing notes.  Pertinent labs & imaging results that were available during my care of the patient were reviewed by me and considered in my medical decision making (see chart for details).  Reassuring exam. It does not seem to be worse than his prior visit. Pain is still localized, does not extend circumferentially around finger, not concerned for felon at this time.  May be that the peroxide has been interfering with wound healing. Advised no further use of this - only soap and water. Recommend warm water soaks. Bactroban applied BID. Will also switch to doxycycline BID x 7 days. If any acute worsening he will be seen in ED. Agrees to plan   Final Clinical Impressions(s) / UC  Diagnoses   Final diagnoses:  Paronychia of right index finger  Discharge Instructions      Stop taking the clindamycin . Instead, start taking the doxycycline Twice daily for 7 days   Soak the finger in warm water for 15 minutes, several times daily. After soaking, apply the bactroban ointment.  Please only use soap and water to wash. Do not use any further peroxide or alcohol as this interferes with wound healing.   If no improvement after 3 days, or symptoms worsening, please go to the emergency department.      ED Prescriptions     Medication Sig Dispense Auth. Provider   doxycycline (VIBRAMYCIN) 100 MG capsule Take 1 capsule (100 mg total) by mouth 2 (two) times daily for 7 days. 14 capsule Garner Dullea, PA-C   mupirocin ointment (BACTROBAN) 2 % Apply 1 Application topically 2 (two) times daily. 15 g Kaida Games, Asberry, PA-C      PDMP not reviewed this encounter.   Jeryl Asberry, PA-C 06/26/24 1649

## 2024-06-26 NOTE — ED Triage Notes (Signed)
 Patient returning for follow up to swelling and pain of the tip of the right pointer finger. Was seen for the same injury here 9/26 and has been taking the medication given with no relief.

## 2024-06-26 NOTE — Discharge Instructions (Addendum)
 Stop taking the clindamycin . Instead, start taking the doxycycline Twice daily for 7 days   Soak the finger in warm water for 15 minutes, several times daily. After soaking, apply the bactroban ointment.  Please only use soap and water to wash. Do not use any further peroxide or alcohol as this interferes with wound healing.   If no improvement after 3 days, or symptoms worsening, please go to the emergency department.

## 2024-06-29 ENCOUNTER — Other Ambulatory Visit (INDEPENDENT_AMBULATORY_CARE_PROVIDER_SITE_OTHER): Payer: Self-pay | Admitting: Primary Care

## 2024-06-29 NOTE — Telephone Encounter (Unsigned)
 Copied from CRM [id number]. Topic: Clinical - Medication Refill >> Jun 29, 2024  3:21 PM Montie POUR wrote: Medication: losartan  (COZAAR ) 100 MG tablet  Has the patient contacted their pharmacy? Yes (Agent: If no, request that the patient contact the pharmacy for the refill. If patient does not wish to contact the pharmacy document the reason why and proceed with request.) (Agent: If yes, when and what did the pharmacy advise?) Pharmacy needs order to refill  This is the patient's preferred pharmacy:  [facility] [address] [place], [place] - [address] [place] [address] Phone: [phone] Fax: [phone]  Is this the correct pharmacy for this prescription? Yes If no, delete pharmacy and type the correct one.   Has the prescription been filled recently? No  Is the patient out of the medication? Yes He has been out since last Friday  Has the patient been seen for an appointment in the last year OR does the patient have an upcoming appointment? Yes  Can we respond through MyChart? No  Agent: Please be advised that Rx refills may take up to 3 business days. We ask that you follow-up with your pharmacy.

## 2024-07-01 MED ORDER — LOSARTAN POTASSIUM 100 MG PO TABS: 100.0000 mg | ORAL_TABLET | Freq: Every day | ORAL | 0 refills | Status: AC

## 2024-07-01 NOTE — Telephone Encounter (Signed)
 Requested Prescriptions  Pending Prescriptions Disp Refills   losartan  (COZAAR ) 100 MG tablet 90 tablet 0    Sig: Take 1 tablet (100 mg total) by mouth daily.     Cardiovascular:  Angiotensin Receptor Blockers Passed - 07/01/2024 10:37 AM      Passed - Cr in normal range and within 180 days    Creat  Date Value Ref Range Status  07/27/2013 0.91 0.50 - 1.35 mg/dL Final   Creatinine, Ser  Date Value Ref Range Status  03/31/2024 1.08 0.76 - 1.27 mg/dL Final         Passed - K in normal range and within 180 days    Potassium  Date Value Ref Range Status  03/31/2024 3.8 3.5 - 5.2 mmol/L Final         Passed - Patient is not pregnant      Passed - Last BP in normal range    BP Readings from Last 1 Encounters:  06/26/24 130/85         Passed - Valid encounter within last 6 months    Recent Outpatient Visits           2 months ago Muscle pain, fibromyalgia   [place] [facility] Celestia Rosaline SQUIBB, NP   3 months ago Screening for colon cancer   [place] [facility] Celestia Rosaline SQUIBB, NP   8 months ago Primary hypertension   [facility] [place] Fleeta Tonia Garnette LITTIE, RPH-CPP   9 months ago Primary hypertension   [facility] [place] Fleeta Tonia, Garnette LITTIE, RPH-CPP   11 months ago Medication refill   [place] [facility] Celestia Rosaline SQUIBB, NP

## 2024-07-02 DIAGNOSIS — K080 Exfoliation of teeth due to systemic causes: Secondary | ICD-10-CM | POA: Diagnosis not present

## 2024-07-16 DIAGNOSIS — H53002 Unspecified amblyopia, left eye: Secondary | ICD-10-CM | POA: Diagnosis not present

## 2024-07-16 DIAGNOSIS — H35033 Hypertensive retinopathy, bilateral: Secondary | ICD-10-CM | POA: Diagnosis not present

## 2024-07-16 DIAGNOSIS — H2513 Age-related nuclear cataract, bilateral: Secondary | ICD-10-CM | POA: Diagnosis not present

## 2024-09-01 NOTE — Progress Notes (Signed)
 Randall Cook                                          MRN: [id number]   09/01/2024   The [facility] Quality Team Specialist reviewed this patient medical record for the purposes of chart review for care gap closure. The following were reviewed: chart review for care gap closure-controlling blood pressure.    [facility]BCI Quality Team

## 2024-10-14 ENCOUNTER — Encounter (HOSPITAL_COMMUNITY): Payer: Self-pay

## 2024-10-14 ENCOUNTER — Ambulatory Visit (INDEPENDENT_AMBULATORY_CARE_PROVIDER_SITE_OTHER)

## 2024-10-14 ENCOUNTER — Ambulatory Visit (HOSPITAL_COMMUNITY)
Admission: EM | Admit: 2024-10-14 | Discharge: 2024-10-14 | Disposition: A | Discharge status: Home / Self Care | Attending: Family Medicine | Admitting: Family Medicine

## 2024-10-14 DIAGNOSIS — R062 Wheezing: Secondary | ICD-10-CM

## 2024-10-14 DIAGNOSIS — R051 Acute cough: Secondary | ICD-10-CM | POA: Diagnosis not present

## 2024-10-14 LAB — POCT INFLUENZA A/B
Influenza A, POC: NEGATIVE
Influenza B, POC: NEGATIVE

## 2024-10-14 LAB — POC SOFIA SARS ANTIGEN FIA: SARS Coronavirus 2 Ag: NEGATIVE

## 2024-10-14 LAB — POCT RESPIRATORY SYNCYTIAL VIRUS: RSV Rapid Ag: NEGATIVE

## 2024-10-14 MED ORDER — PREDNISONE 20 MG PO TABS: 40.0000 mg | ORAL_TABLET | Freq: Every day | ORAL | 0 refills | Status: AC

## 2024-10-14 MED ORDER — PROMETHAZINE-DM 6.25-15 MG/5ML PO SYRP: 5.0000 mL | ORAL_SOLUTION | Freq: Four times a day (QID) | ORAL | 0 refills | Status: AC | PRN

## 2024-10-14 MED ORDER — IPRATROPIUM-ALBUTEROL 0.5-2.5 (3) MG/3ML IN SOLN
3.0000 mL | Freq: Once | RESPIRATORY_TRACT | Status: AC
  Administered 2024-10-14: 3 mL via RESPIRATORY_TRACT

## 2024-10-14 MED ORDER — IPRATROPIUM-ALBUTEROL 0.5-2.5 (3) MG/3ML IN SOLN
RESPIRATORY_TRACT | Status: AC
  Filled 2024-10-14: qty 3

## 2024-10-14 MED ORDER — METHYLPREDNISOLONE SODIUM SUCC 125 MG IJ SOLR
INTRAMUSCULAR | Status: AC
  Filled 2024-10-14: qty 2

## 2024-10-14 MED ORDER — METHYLPREDNISOLONE SODIUM SUCC 125 MG IJ SOLR
125.0000 mg | Freq: Once | INTRAMUSCULAR | Status: AC
  Administered 2024-10-14: 125 mg via INTRAMUSCULAR

## 2024-10-14 NOTE — Medical Student Note (Signed)
 Engineer, Site Note For educational purposes for Medical, PA and NP students only and not part of the legal medical record.   CSN: [id number] Arrival date & time: 10/14/24  1714      History   Chief Complaint Chief Complaint  Patient presents with   Cough    HPI Randall Cook is a 69 y.o. male with a hx of HLD, HTN, and sleep apnea.  He reports a 2 week hx of productive cough with clear sputum, rhinorrhea, and intermittent HA. 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 He denies any fever, chills, body aches, ShOB, or CP. He has not had any orthopnea, swelling. No known sick contacts. He has been taking decongestants with minimal relief. He is a former tobacco smoker.    Cough Associated symptoms: rhinorrhea   Associated symptoms: no chest pain, no chills, no fever, no myalgias, no shortness of breath, no sore throat and no wheezing     Past Medical History:  Diagnosis Date   Arthritis    Dysrhythmia    tachycardia- EKG,clearance 02/29/12 Dr Seabron  on chart   GERD (gastroesophageal reflux disease)    Hyperlipidemia    Hypertension    LOV,   Dr Seabron 02/29/12 on chart-   CBC with diff, CMET 02/29/12 on chart   Sleep apnea    STOP BANG SCORE 4    Patient Active Problem List   Diagnosis Date Noted   Obesity (BMI 30.0-34.9) 10/02/2019   Acute hypoxemic respiratory failure due to COVID-19 [facility]) 09/27/2019   Hypertension 01/07/2018   Hyperlipidemia 04/09/2017   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 Authorizing Provider  atorvastatin  (LIPITOR) 20 MG tablet  Take 1 tablet (20 mg total) by mouth daily. 12/04/23   Celestia Rosaline SQUIBB, NP  baclofen  (LIORESAL ) 10 MG tablet Take 1 tablet (10 mg total) by mouth 3 (three) times daily. 04/28/24   Celestia Rosaline SQUIBB, NP  diclofenac  Sodium (VOLTAREN ) 1 % GEL Apply 4 g topically 4 (four) times daily. Apply to affected areas 4 times daily as needed for pain. 07/25/23   Celestia Rosaline SQUIBB, NP  hydrochlorothiazide  (HYDRODIURIL ) 25 MG tablet Take 1 tablet daily in the morning 04/28/24   Celestia Rosaline SQUIBB, NP  losartan  (COZAAR ) 100 MG tablet Take 1 tablet (100 mg total) by mouth daily. 07/01/24   Celestia Rosaline SQUIBB, NP  mupirocin  ointment (BACTROBAN ) 2 % Apply 1 Application topically 2 (two) times daily. 06/26/24   Rising, Asberry RIGGERS    Family History History reviewed. No pertinent family history.  Social History Social History[1]   Allergies   Penicillins   Review of Systems Review of Systems  Constitutional:  Negative for chills, fatigue and fever.  HENT:  Positive for congestion and rhinorrhea. Negative for sore throat.   Respiratory:  Positive for cough. Negative for chest tightness, shortness of breath and wheezing.   Cardiovascular:  Negative for chest pain.  Gastrointestinal:  Negative for abdominal pain, diarrhea, nausea and vomiting.  Musculoskeletal:  Negative for myalgias.  Physical Exam Updated Vital Signs BP (!) 144/92   Pulse 96   Temp 97.8 F (36.6 C) (Oral)   Resp 18   SpO2 91%   Physical Exam   ED Treatments / Results  Labs (all labs ordered are listed, but only abnormal results are displayed) Labs Reviewed  POC SOFIA SARS ANTIGEN FIA  POCT INFLUENZA A/B  POCT RESPIRATORY SYNCYTIAL VIRUS    EKG  Radiology No results found.  Procedures Procedures (including critical care time)  Medications Ordered in ED Medications  ipratropium-albuterol  (DUONEB) 0.5-2.5 (3) MG/3ML nebulizer solution 3 mL (has no administration in time range)     Initial Impression /  Assessment and Plan / ED Course  I have reviewed the triage vital signs and the nursing notes.  Pertinent labs & imaging results that were available during my care of the patient were reviewed by me and considered in my medical decision making (see chart for details).    ED Course  While examining patient his SpO2 would fluctuate between 88% on 91% on RA. He denies ShOB, and has no use of accessory muscles. He is able to speak complete sentences between breaths. Will order DuoNeb and CXR, then re-evaluate.   CXR is negative.  POC Flu/COVID19/RSV are negative.   Cough  He reports that he subjectively feels better after the DuoNeb treatment. SpO2 is 92% on R/A. Lung sounds remain clear & =. I suspect the persistent cough and rhinorrhea is post viral and is lingering due to the cold environment he works in. Will trial a course of steroids. Will admin Solu-medrol  125 mg IM in clinic today and start prednisone  40 mg PO daily for 5 days starting tomorrow. Promethazine -DM 5 ml four times daily prn for cough. Strict ER precautions given if he has any worsening or new onset of ShOB, fever, or other concerning symptoms.   Elevated BP  He had an elevated BP during this visit. I believe this is exacerbated by decongestant use in the setting of a HTN diagnosis. Educated pt that only decongestant that is safe to use is Corcidin HBP or generic chlorpheniramine. Advised pt to log ambulatory readings and to follow up with PCP for further management if it does not return to under 140/80.   Red flag symptoms reviewed and return precautions given.     Final Clinical Impressions(s) / ED Diagnoses   Final diagnoses:  Acute cough    New Prescriptions New Prescriptions   No medications on file       [1]  Social History Tobacco Use   Smoking status: Former    Current packs/day: 0.00    Average packs/day: 0.5 packs/day for 20.0 years (10.0 ttl pk-yrs)    Types: Cigarettes    Start date: 03/05/1979     Quit date: 03/05/1999    Years since quitting: 25.6   Smokeless tobacco: Never  Vaping Use   Vaping status: Never Used  Substance Use Topics   Alcohol use: Not Currently    Comment: none x 20 yrs   Drug use: No   "

## 2024-10-14 NOTE — ED Triage Notes (Signed)
 Pt c/o cough and nasal drainage x2 wks. 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 States took multiple OTC medication with relief.

## 2024-10-17 NOTE — ED Provider Notes (Signed)
 " Randall Cook   [id number] 10/14/24 Arrival Time: 1714  ASSESSMENT & PLAN:  1. Acute cough   2. Wheezing    Without resp distress. Discussed typical duration of likely viral illness. Results for orders placed or performed during the hospital encounter of 10/14/24  POC SARS Coronavirus Ag   Collection Time: 10/14/24  7:41 PM  Result Value Ref Range   SARS Coronavirus 2 Ag Negative Negative  POC Influenza A/B   Collection Time: 10/14/24  7:41 PM  Result Value Ref Range   Influenza A, POC Negative Negative   Influenza B, POC Negative Negative  POCT RSV   Collection Time: 10/14/24  7:41 PM  Result Value Ref Range   RSV Rapid Ag negative    OTC symptom care as needed.  Meds ordered this encounter  Medications   ipratropium-albuterol  (DUONEB) 0.5-2.5 (3) MG/3ML nebulizer solution 3 mL   methylPREDNISolone  sodium succinate (SOLU-MEDROL ) 125 mg/2 mL injection 125 mg   predniSONE  (DELTASONE ) 20 MG tablet    Sig: Take 2 tablets (40 mg total) by mouth daily.    Dispense:  10 tablet    Refill:  0   promethazine -dextromethorphan (PROMETHAZINE -DM) 6.25-15 MG/5ML syrup    Sig: Take 5 mLs by mouth 4 (four) times daily as needed for cough.    Dispense:  118 mL    Refill:  0     Follow-up Information     Schedule an appointment as soon as possible for a visit  with Celestia Rosaline SQUIBB, NP.   Specialty: Internal Medicine Why: For follow up. Contact information: [address] [place] [address] [phone]         [facility] Emergency Department at [facility].   Specialty: Emergency Medicine Why: If symptoms worsen in any way. Contact information: [address] [place]  [address] [phone]                Reviewed expectations re: course of current medical issues. Questions answered. Outlined signs and symptoms indicating need for more acute intervention. Understanding verbalized. After Visit Summary  given.   SUBJECTIVE: History from: Patient. Randall Cook is a 69 y.o. male. Pt c/o cough and nasal drainage x2 wks. 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 States took multiple OTC medication with relief. Feels wheezing at times. Denies current CP/SO. Denies: fever. Normal PO intake without n/v/d.  OBJECTIVE:  Vitals:   10/14/24 1835  BP: (!) 144/92  Pulse: 96  Resp: 18  Temp: 97.8 F (36.6 C)  TempSrc: Oral  SpO2: 91%    General appearance: alert; no distress Eyes: PERRLA; EOMI; conjunctiva normal HENT: [place]; AT; with nasal congestion Neck: supple  Lungs: speaks full sentences without difficulty; unlabored; bilat wheezing Extremities: no edema Skin: warm and dry Neurologic: normal gait Psychological: alert and cooperative; normal mood and affect  Labs: Results for orders placed or performed during the hospital encounter of 10/14/24  POC SARS Coronavirus Ag   Collection Time: 10/14/24  7:41 PM  Result Value Ref Range   SARS Coronavirus 2 Ag Negative Negative  POC Influenza A/B   Collection Time: 10/14/24  7:41 PM  Result Value Ref Range   Influenza A, POC Negative Negative   Influenza B, POC Negative Negative  POCT RSV   Collection Time: 10/14/24  7:41 PM  Result Value Ref Range   RSV Rapid Ag negative    Labs Reviewed  POC SOFIA SARS ANTIGEN FIA  POCT INFLUENZA A/B  POCT RESPIRATORY SYNCYTIAL VIRUS    Imaging: DG Chest 2 View Result Date: 10/14/2024 EXAM: 2 VIEW(S) XRAY OF THE CHEST 10/14/2024 07:36:18 PM COMPARISON: Chest x ray 11/06/2023. CLINICAL HISTORY: cough; hypoxia FINDINGS: LUNGS AND PLEURA: No focal pulmonary opacity. No pleural effusion. No pneumothorax. HEART AND MEDIASTINUM: The aorta is tortuous, unchanged. No acute abnormality of the cardiac and mediastinal silhouettes. BONES AND SOFT TISSUES: No acute osseous abnormality. IMPRESSION: 1. No acute process. Electronically signed by: Greig Pique MD 10/14/2024 07:39 PM EST RP Workstation:  [id number]    Allergies[1]  Past Medical History:  Diagnosis Date   Arthritis    Dysrhythmia    tachycardia- EKG,clearance 02/29/12 Dr Seabron  on chart   GERD (gastroesophageal reflux disease)    Hyperlipidemia    Hypertension    LOV,   Dr Seabron 02/29/12 on chart-   CBC with diff, CM[facility] 02/29/12 on chart   Sleep apnea    STOP BANG SCORE 4   Social History   Socioeconomic History   Marital status: Married    Spouse name: Not on file   Number of children: Not on file   Years of education: Not on file   Highest education level: Not on file  Occupational History   Not on file  Tobacco Use   Smoking status: Former    Current packs/day: 0.00    Average packs/day: 0.5 packs/day for 20.0 years (10.0 ttl pk-yrs)    Types: Cigarettes    Start date: 03/05/1979    Quit date: 03/05/1999    Years since quitting: 25.6   Smokeless tobacco: Never  Vaping Use   Vaping status: Never Used  Substance and Sexual Activity   Alcohol use: Not Currently    Comment: none x 20 yrs   Drug use: No   Sexual activity: Yes  Other Topics Concern   Not on file  Social History Narrative   Not on file   Social Drivers of Health   Tobacco Use: Medium Risk (10/14/2024)   Patient History    Smoking Tobacco Use: Former    Smokeless Tobacco Use: Never    Passive Exposure: Not on Actuary Strain: Low Risk (12/18/2023)   Overall Financial Resource Strain (CARDIA)    Difficulty of Paying Living Expenses: Not hard at all  Food Insecurity: No Food Insecurity (12/18/2023)   Hunger Vital Sign    Worried About Running Out of Food in the Last Year: Never true    Ran Out of Food in the Last Year: Never true  Transportation Needs: No Transportation Needs (12/18/2023)   PRAPARE - Administrator, Civil Service (Medical): No    Lack of Transportation (Non-Medical): No  Physical Activity: Sufficiently Active (12/18/2023)   Exercise Vital Sign    Days of Exercise per Week: 5 days    Minutes  of Exercise per Session: 50 min  Stress: No Stress Concern Present (12/18/2023)   [employer] of Occupational Health - Occupational Stress Questionnaire    Feeling of Stress : Not at all  Social Connections: Socially Integrated (12/18/2023)   Social Connection and Isolation Panel    Frequency of Communication with Friends and Family: More than three times a week    Frequency of Social Gatherings with Friends and Family: More than three times a week    Attends Religious Services: More than 4 times per year    Active Member of Clubs or Organizations: Patient unable to answer    Attends Club or  Organization Meetings: More than 4 times per year    Marital Status: Married  Catering Manager Violence: Not At Risk (12/18/2023)   Humiliation, Afraid, Rape, and Kick questionnaire    Fear of Current or Ex-Partner: No    Emotionally Abused: No    Physically Abused: No    Sexually Abused: No  Depression (PHQ2-9): Low Risk (04/28/2024)   Depression (PHQ2-9)    PHQ-2 Score: 0  Alcohol Screen: Low Risk (12/18/2023)   Alcohol Screen    Last Alcohol Screening Score (AUDIT): 0  Housing: Unknown (12/18/2023)   Housing Stability Vital Sign    Unable to Pay for Housing in the Last Year: No    Number of Times Moved in the Last Year: Not on file    Homeless in the Last Year: No  Utilities: Not At Risk (12/18/2023)   AHC Utilities    Threatened with loss of utilities: No  Health Literacy: Adequate Health Literacy (12/18/2023)   B1300 Health Literacy    Frequency of need for help with medical instructions: Never   History reviewed. No pertinent family history. Past Surgical History:  Procedure Laterality Date   NO PAST SURGERIES     PARTIAL KNEE ARTHROPLASTY  03/10/2012   Procedure: UNICOMPARTMENTAL KNEE;  Surgeon: Donnice JONETTA Car, MD;  Location: [facility];  Service: Orthopedics;  Laterality: Left;      [1]  Allergies Allergen Reactions   Penicillins Rash    Has patient had a PCN reaction causing  immediate rash, facial/tongue/throat swelling, SOB or lightheadedness with hypotension: Unknown Has patient had a PCN reaction causing severe rash involving mucus membranes or skin necrosis: No  Has patient had a PCN reaction that required hospitalization: No  Has patient had a PCN reaction occurring within the last 10 years: Yes  If all of the above answers are NO, then may proceed with Cephalosporin use.      Rolinda Rogue, MD 10/17/24 [phone]  "

## 2024-10-28 ENCOUNTER — Other Ambulatory Visit (INDEPENDENT_AMBULATORY_CARE_PROVIDER_SITE_OTHER): Payer: Self-pay | Admitting: Primary Care

## 2024-10-30 NOTE — Telephone Encounter (Signed)
 Requested medications are due for refill today.  yes  Requested medications are on the active medications list.  yes  Last refill. 07/01/2024 #90 0 rf  Future visit scheduled.   no  Notes to clinic.  Expired labs.    Requested Prescriptions  Pending Prescriptions Disp Refills   losartan  (COZAAR ) 100 MG tablet [Pharmacy Med Name: Losartan  Potassium 100 MG Oral Tablet] 90 tablet 0    Sig: Take 1 tablet by mouth once daily     Cardiovascular:  Angiotensin Receptor Blockers Failed - 10/30/2024 11:17 AM      Failed - Cr in normal range and within 180 days    Creat  Date Value Ref Range Status  07/27/2013 0.91 0.50 - 1.35 mg/dL Final   Creatinine, Ser  Date Value Ref Range Status  03/31/2024 1.08 0.76 - 1.27 mg/dL Final         Failed - K in normal range and within 180 days    Potassium  Date Value Ref Range Status  03/31/2024 3.8 3.5 - 5.2 mmol/L Final         Failed - Last BP in normal range    BP Readings from Last 1 Encounters:  10/14/24 (!) 144/92         Failed - Valid encounter within last 6 months    Recent Outpatient Visits           6 months ago Muscle pain, fibromyalgia   [facility] Celestia Rosaline SQUIBB, NP   7 months ago Screening for colon cancer   [facility] Celestia Rosaline SQUIBB, NP   12 months ago Primary hypertension   [facility] [place] Fle[facility] Tonia Garnette LITTIE, RPH-CPP   1 year ago Primary hypertension   [facility] [place] Fleeta Tonia Garnette LITTIE, RPH-CPP   1 year ago Medication refill    [facility] Celestia Rosaline SQUIBB, NP              Passed - Patient is not pregnant

## 2024-10-30 NOTE — Telephone Encounter (Signed)
 Will forward to provider
